# Patient Record
Sex: Female | Born: 1955 | Race: White | Hispanic: No | Marital: Single | Smoking: Current every day smoker
Health system: Southern US, Community
[De-identification: ages and names within clinical notes are randomized; demographics above are authoritative.]

## PROBLEM LIST (undated history)

## (undated) DIAGNOSIS — I639 Cerebral infarction, unspecified: Secondary | ICD-10-CM

## (undated) DIAGNOSIS — R0600 Dyspnea, unspecified: Secondary | ICD-10-CM

## (undated) DIAGNOSIS — G4730 Sleep apnea, unspecified: Secondary | ICD-10-CM

## (undated) DIAGNOSIS — K219 Gastro-esophageal reflux disease without esophagitis: Secondary | ICD-10-CM

## (undated) DIAGNOSIS — N189 Chronic kidney disease, unspecified: Secondary | ICD-10-CM

## (undated) DIAGNOSIS — Q249 Congenital malformation of heart, unspecified: Secondary | ICD-10-CM

## (undated) DIAGNOSIS — I10 Essential (primary) hypertension: Secondary | ICD-10-CM

## (undated) HISTORY — DX: Essential (primary) hypertension: I10

## (undated) HISTORY — DX: Congenital malformation of heart, unspecified: Q24.9

## (undated) HISTORY — PX: RENAL BIOPSY: SHX156

## (undated) HISTORY — DX: Sleep apnea, unspecified: G47.30

## (undated) HISTORY — DX: Cerebral infarction, unspecified: I63.9

## (undated) HISTORY — DX: Gastro-esophageal reflux disease without esophagitis: K21.9

## (undated) HISTORY — PX: TONSILLECTOMY: SUR1361

---

## 2010-05-21 LAB — FECAL OCCULT BLOOD, GUAIAC: Fecal Occult Blood: NEGATIVE

## 2010-05-29 ENCOUNTER — Encounter: Payer: Self-pay | Admitting: Nurse Practitioner

## 2010-05-29 DIAGNOSIS — K219 Gastro-esophageal reflux disease without esophagitis: Secondary | ICD-10-CM

## 2010-05-29 DIAGNOSIS — E785 Hyperlipidemia, unspecified: Secondary | ICD-10-CM | POA: Insufficient documentation

## 2010-05-29 DIAGNOSIS — I10 Essential (primary) hypertension: Secondary | ICD-10-CM | POA: Insufficient documentation

## 2010-05-29 DIAGNOSIS — M8580 Other specified disorders of bone density and structure, unspecified site: Secondary | ICD-10-CM | POA: Insufficient documentation

## 2011-02-11 ENCOUNTER — Ambulatory Visit: Payer: Worker's Compensation | Attending: Orthopedic Surgery | Admitting: Physical Therapy

## 2011-02-11 DIAGNOSIS — M25579 Pain in unspecified ankle and joints of unspecified foot: Secondary | ICD-10-CM | POA: Insufficient documentation

## 2011-02-11 DIAGNOSIS — R269 Unspecified abnormalities of gait and mobility: Secondary | ICD-10-CM | POA: Insufficient documentation

## 2011-02-11 DIAGNOSIS — M25676 Stiffness of unspecified foot, not elsewhere classified: Secondary | ICD-10-CM | POA: Insufficient documentation

## 2011-02-11 DIAGNOSIS — R5381 Other malaise: Secondary | ICD-10-CM | POA: Insufficient documentation

## 2011-02-11 DIAGNOSIS — M25673 Stiffness of unspecified ankle, not elsewhere classified: Secondary | ICD-10-CM | POA: Insufficient documentation

## 2011-02-11 DIAGNOSIS — IMO0001 Reserved for inherently not codable concepts without codable children: Secondary | ICD-10-CM | POA: Insufficient documentation

## 2011-02-13 ENCOUNTER — Ambulatory Visit: Payer: Worker's Compensation | Admitting: Physical Therapy

## 2011-02-20 ENCOUNTER — Ambulatory Visit: Payer: Worker's Compensation | Admitting: Physical Therapy

## 2011-02-26 ENCOUNTER — Ambulatory Visit: Payer: Worker's Compensation | Attending: Orthopedic Surgery | Admitting: Physical Therapy

## 2011-02-26 DIAGNOSIS — R269 Unspecified abnormalities of gait and mobility: Secondary | ICD-10-CM | POA: Insufficient documentation

## 2011-02-26 DIAGNOSIS — IMO0001 Reserved for inherently not codable concepts without codable children: Secondary | ICD-10-CM | POA: Insufficient documentation

## 2011-02-26 DIAGNOSIS — M25673 Stiffness of unspecified ankle, not elsewhere classified: Secondary | ICD-10-CM | POA: Insufficient documentation

## 2011-02-26 DIAGNOSIS — M25676 Stiffness of unspecified foot, not elsewhere classified: Secondary | ICD-10-CM | POA: Insufficient documentation

## 2011-02-26 DIAGNOSIS — M25579 Pain in unspecified ankle and joints of unspecified foot: Secondary | ICD-10-CM | POA: Insufficient documentation

## 2011-02-26 DIAGNOSIS — R5381 Other malaise: Secondary | ICD-10-CM | POA: Insufficient documentation

## 2011-03-05 ENCOUNTER — Encounter: Payer: Self-pay | Admitting: Physical Therapy

## 2012-05-24 ENCOUNTER — Other Ambulatory Visit: Payer: Self-pay

## 2012-05-24 MED ORDER — SIMVASTATIN 20 MG PO TABS: 20.0000 mg | ORAL_TABLET | Freq: Every day | ORAL | Status: DC

## 2012-06-09 ENCOUNTER — Other Ambulatory Visit: Payer: Self-pay | Admitting: *Deleted

## 2012-06-09 MED ORDER — LISINOPRIL 20 MG PO TABS: 20.0000 mg | ORAL_TABLET | Freq: Every day | ORAL | Status: DC

## 2012-06-09 NOTE — Telephone Encounter (Signed)
LAST OV 9/13

## 2012-08-02 ENCOUNTER — Encounter: Payer: Self-pay | Admitting: Nurse Practitioner

## 2012-08-02 ENCOUNTER — Ambulatory Visit (INDEPENDENT_AMBULATORY_CARE_PROVIDER_SITE_OTHER): Payer: PRIVATE HEALTH INSURANCE | Admitting: Nurse Practitioner

## 2012-08-02 VITALS — BP 131/76 | HR 56 | Temp 96.8°F | Ht 69.0 in | Wt 250.0 lb

## 2012-08-02 DIAGNOSIS — E785 Hyperlipidemia, unspecified: Secondary | ICD-10-CM

## 2012-08-02 DIAGNOSIS — I10 Essential (primary) hypertension: Secondary | ICD-10-CM

## 2012-08-02 DIAGNOSIS — M8580 Other specified disorders of bone density and structure, unspecified site: Secondary | ICD-10-CM

## 2012-08-02 DIAGNOSIS — K219 Gastro-esophageal reflux disease without esophagitis: Secondary | ICD-10-CM

## 2012-08-02 DIAGNOSIS — R5381 Other malaise: Secondary | ICD-10-CM

## 2012-08-02 DIAGNOSIS — M949 Disorder of cartilage, unspecified: Secondary | ICD-10-CM

## 2012-08-02 DIAGNOSIS — M899 Disorder of bone, unspecified: Secondary | ICD-10-CM

## 2012-08-02 DIAGNOSIS — R079 Chest pain, unspecified: Secondary | ICD-10-CM

## 2012-08-02 DIAGNOSIS — R5383 Other fatigue: Secondary | ICD-10-CM

## 2012-08-02 NOTE — Progress Notes (Signed)
Subjective:    Patient ID: Lori Ortiz, female    DOB: 03-02-1955, 58 y.o.   MRN: [id number]  Hypertension This is a chronic problem. The current episode started more than 1 year ago. The problem has been resolved since onset. The problem is controlled. Associated symptoms include chest pain. Pertinent negatives include no blurred vision, headaches, malaise/fatigue, orthopnea, palpitations, peripheral edema or shortness of breath. There are no associated agents to hypertension. Risk factors for coronary artery disease include dyslipidemia, obesity, post-menopausal state and sedentary lifestyle. Past treatments include ACE inhibitors and beta blockers. Compliance problems include diet and exercise.  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  Hyperlipidemia This is a chronic problem. The current episode started more than 1 year ago. The problem is controlled. Recent lipid tests were reviewed and are normal. Exacerbating diseases include obesity. Factors aggravating her hyperlipidemia include beta blockers. Associated symptoms include chest pain. Pertinent negatives include no shortness of breath. Current antihyperlipidemic treatment includes statins. The current treatment provides moderate improvement of lipids. Compliance problems include adherence to diet and adherence to exercise.  Risk factors for coronary artery disease include hypertension, obesity, post-menopausal and a sedentary lifestyle.  GERD Zantac daily- works well for her Fatigue Patient says she feels fatigued all the time - if she sits for any length of time she falls asleep. Chest pain Occurs mainly at work- Can last for several hours- Doesn't have any associated symptoms like SOB or sweating- Describes pain as uncomfortable rather than a crushing pain.  Review of Systems  Constitutional: Positive for fatigue. Negative for malaise/fatigue and unexpected weight change.  Eyes: Negative for blurred  vision.  Respiratory: Positive for chest tightness. Negative for cough, choking and shortness of breath.   Cardiovascular: Positive for chest pain. Negative for palpitations, orthopnea and leg swelling.  Gastrointestinal: Negative.   Endocrine: Negative.   Genitourinary: Negative.   Neurological: Negative for headaches.       Objective:   Physical Exam  Constitutional: She is oriented to person, place, and time. She appears well-developed and well-nourished.  HENT:  Nose: Nose normal.  Mouth/Throat: Oropharynx is clear and moist.  Eyes: EOM are normal.  Neck: Trachea normal, normal range of motion and full passive range of motion without pain. Neck supple. No JVD present. Carotid bruit is not present. No thyromegaly present.  Cardiovascular: Normal rate, regular rhythm, normal heart sounds and intact distal pulses.  Exam reveals no gallop and no friction rub.   No murmur heard. Pulmonary/Chest: Effort normal and breath sounds normal.  Abdominal: Soft. Bowel sounds are normal. She exhibits no distension and no mass. There is no tenderness.  Musculoskeletal: Normal range of motion.  Lymphadenopathy:    She has no cervical adenopathy.  Neurological: She is alert and oriented to person, place, and time. She has normal reflexes.  Skin: Skin is warm and dry.  Psychiatric: She has a normal mood and affect. Her behavior is normal. Judgment and thought content normal.   BP 131/76  Pulse 56  Temp(Src) 96.8 F (36 C) (Oral)  Ht 5\' 9"  (1.753 m)  Wt 250 lb (113.399 kg)  BMI 36.9 kg/m2  EKG-NSR      Assessment & Plan:   1. Chest pain   2. Hypertension   3. Hyperlipidemia   4. GERD (gastroesophageal reflux disease)   5. Osteopenia    Orders Placed This Encounter  Procedures  . COMPLETE METABOLIC PANEL WITH GFR  . COMPLETE METABOLIC PANEL WITH GFR  .  NMR Lipoprofile with Lipids  . Anemia panel 7  . Thyroid Panel With TSH  . EKG 12-Lead     Medication List       These  changes are accurate as of: 08/02/2012  4:22 PM. If you have any questions, ask your nurse or doctor.          STOP taking these medications       calcium carbonate 200 MG capsule  Stopped by:  Chevis Pretty, FNP     HEMOCYTE PLUS PO  Stopped by:  Chevis Pretty, FNP     [facility] 500-40 MG Tb24  Generic drug:  Niacin-Simvastatin  Stopped by:  Chevis Pretty, FNP     Vitamin D 2000 UNITS Caps  Stopped by:  Chevis Pretty, FNP      TAKE these medications       lisinopril 20 MG tablet  Commonly known as:  PRINIVIL,ZESTRIL  Take 1 tablet (20 mg total) by mouth daily.     metoprolol tartrate 25 MG tablet  Commonly known as:  LOPRESSOR  Take 25 mg by mouth 2 (two) times daily.     ranitidine 150 MG tablet  Commonly known as:  ZANTAC  Take 150 mg by mouth 2 (two) times daily.     simvastatin 20 MG tablet  Commonly known as:  ZOCOR  Take 1 tablet (20 mg total) by mouth at bedtime.       Continue all meds Labs pending Diet an dexercsie encouraged Stress management  Mary-Margaret Hassell Done, FNP

## 2012-08-02 NOTE — Patient Instructions (Signed)
Stress Stress-related medical problems are becoming increasingly common. The body has a built-in physical response to stressful situations. Faced with pressure, challenge or danger, we need to react quickly. Our bodies release hormones such as cortisol and adrenaline to help do this. These hormones are part of the "fight or flight" response and affect the metabolic rate, heart rate and blood pressure, resulting in a heightened, stressed state that prepares the body for optimum performance in dealing with a stressful situation. It is likely that early man required these mechanisms to stay alive, but usually modern stresses do not call for this, and the same hormones released in today's world can damage health and reduce coping ability. CAUSES  Pressure to perform at work, at school or in sports.  Threats of physical violence.  Money worries.  Arguments.  Family conflicts.  Divorce or separation from significant other.  Bereavement.  New job or unemployment.  Changes in location.  Alcohol or drug abuse. SOMETIMES, THERE IS NO PARTICULAR REASON FOR DEVELOPING STRESS. Almost all people are at risk of being stressed at some time in their lives. It is important to know that some stress is temporary and some is long term.  Temporary stress will go away when a situation is resolved. Most people can cope with short periods of stress, and it can often be relieved by relaxing, taking a walk, chatting through issues with friends, or having a good night's sleep.  Chronic (long-term, continuous) stress is much harder to deal with. It can be psychologically and emotionally damaging. It can be harmful both for an individual and for friends and family. SYMPTOMS Everyone reacts to stress differently. There are some common effects that help [place] recognize it. In times of extreme stress, people may:  Shake uncontrollably.  Breathe faster and deeper than normal (hyperventilate).  Vomit.  For people  with asthma, stress can trigger an attack.  For some people, stress may trigger migraine headaches, ulcers, and body pain. PHYSICAL EFFECTS OF STRESS MAY INCLUDE:  Loss of energy.  Skin problems.  Aches and pains resulting from tense muscles, including neck ache, backache and tension headaches.  Increased pain from arthritis and other conditions.  Irregular heart beat (palpitations).  Periods of irritability or anger.  Apathy or depression.  Anxiety (feeling uptight or worrying).  Unusual behavior.  Loss of appetite.  Comfort eating.  Lack of concentration.  Loss of, or decreased, sex-drive.  Increased smoking, drinking, or recreational drug use.  For women, missed periods.  Ulcers, joint pain, and muscle pain. Post-traumatic stress is the stress caused by any serious accident, strong emotional damage, or extremely difficult or violent experience such as rape or war. Post-traumatic stress victims can experience mixtures of emotions such as fear, shame, depression, guilt or anger. It may include recurrent memories or images that may be haunting. These feelings can last for weeks, months or even years after the traumatic event that triggered them. Specialized treatment, possibly with medicines and psychological therapies, is available. If stress is causing physical symptoms, severe distress or making it difficult for you to function as normal, it is worth seeing your caregiver. It is important to remember that although stress is a usual part of life, extreme or prolonged stress can lead to other illnesses that will need treatment. It is better to visit a doctor sooner rather than later. Stress has been linked to the development of high blood pressure and heart disease, as well as insomnia and depression. There is no diagnostic test for  stress since everyone reacts to it differently. But a caregiver will be able to spot the physical symptoms, such  as:  Headaches.  Shingles.  Ulcers. Emotional distress such as intense worry, low mood or irritability should be detected when the doctor asks pertinent questions to identify any underlying problems that might be the cause. In case there are physical reasons for the symptoms, the doctor may also want to do some tests to exclude certain conditions. If you feel that you are suffering from stress, try to identify the aspects of your life that are causing it. Sometimes you may not be able to change or avoid them, but even a small change can have a positive ripple effect. A simple lifestyle change can make all the difference. STRATEGIES THAT CAN HELP DEAL WITH STRESS:  Delegating or sharing responsibilities.  Avoiding confrontations.  Learning to be more assertive.  Regular exercise.  Avoid using alcohol or street drugs to cope.  Eating a healthy, balanced diet, rich in fruit and vegetables and proteins.  Finding humor or absurdity in stressful situations.  Never taking on more than you know you can handle comfortably.  Organizing your time better to get as much done as possible.  Talking to friends or family and sharing your thoughts and fears.  Listening to music or relaxation tapes.  Tensing and then relaxing your muscles, starting at the toes and working up to the head and neck. If you think that you would benefit from help, either in identifying the things that are causing your stress or in learning techniques to help you relax, see a caregiver who is capable of helping you with this. Rather than relying on medications, it is usually better to try and identify the things in your life that are causing stress and try to deal with them. There are many techniques of managing stress including counseling, psychotherapy, aromatherapy, yoga, and exercise. Your caregiver can help you determine what is best for you. Document Released: 05/03/2002 Document Revised: 05/05/2011 Document  Reviewed: 03/30/2007 [facility] Patient Information 2014 [place], [place].

## 2012-08-03 LAB — NMR LIPOPROFILE WITH LIPIDS
HDL Size: 8.9 nm — ABNORMAL LOW (ref >=9.2)
HDL-C: 33 mg/dL — ABNORMAL LOW (ref >=40)
LDL (calc): 67 mg/dL (ref <100)
LDL Particle Number: 1373 nmol/L — ABNORMAL HIGH (ref <1000)
LDL Size: 19.6 nm — ABNORMAL LOW (ref >20.5)
Large HDL-P: 4.5 umol/L — ABNORMAL LOW (ref >=4.8)
VLDL Size: 52.3 nm — ABNORMAL HIGH (ref <=46.6)

## 2012-08-03 LAB — THYROID PANEL WITH TSH
T4, Total: 10.2 ug/dL (ref 5.0–12.5)
TSH: 1.253 u[IU]/mL (ref 0.350–4.500)

## 2012-08-03 LAB — ANEMIA PANEL 7
%SAT: 30 % (ref 20–55)
ABS Retic: 34.8 10*3/uL (ref 19.0–186.0)
Ferritin: 166 ng/mL (ref 10–291)
MCV: 89.7 fL (ref 78.0–100.0)
Platelets: 269 10*3/uL (ref 150–400)
RDW: 13.2 % (ref 11.5–15.5)
Retic Ct Pct: 0.8 % (ref 0.4–2.3)
Vitamin B-12: 310 pg/mL (ref 211–911)
WBC: 6.8 10*3/uL (ref 4.0–10.5)

## 2012-08-03 LAB — COMPLETE METABOLIC PANEL WITH GFR
Alkaline Phosphatase: 90 U/L (ref 39–117)
BUN: 28 mg/dL — ABNORMAL HIGH (ref 6–23)
CO2: 28 mEq/L (ref 19–32)
Creat: 1.21 mg/dL — ABNORMAL HIGH (ref 0.50–1.10)
GFR, Est African American: 57 mL/min — ABNORMAL LOW
GFR, Est Non African American: 49 mL/min — ABNORMAL LOW
Glucose, Bld: 99 mg/dL (ref 70–99)
Total Bilirubin: 0.3 mg/dL (ref 0.3–1.2)

## 2012-08-04 NOTE — Progress Notes (Signed)
Pt aware of labs will change to zocor 40- please send in [facility]- pt aware that we will

## 2012-08-13 ENCOUNTER — Other Ambulatory Visit: Payer: Self-pay | Admitting: General Practice

## 2012-12-07 ENCOUNTER — Other Ambulatory Visit: Payer: Self-pay | Admitting: Nurse Practitioner

## 2012-12-08 ENCOUNTER — Encounter: Payer: Self-pay | Admitting: Family Medicine

## 2012-12-08 ENCOUNTER — Encounter (INDEPENDENT_AMBULATORY_CARE_PROVIDER_SITE_OTHER): Payer: Self-pay

## 2012-12-08 ENCOUNTER — Ambulatory Visit (INDEPENDENT_AMBULATORY_CARE_PROVIDER_SITE_OTHER): Payer: PRIVATE HEALTH INSURANCE | Admitting: Family Medicine

## 2012-12-08 VITALS — BP 165/75 | HR 65 | Temp 97.8°F | Ht 69.0 in | Wt 247.6 lb

## 2012-12-08 DIAGNOSIS — J069 Acute upper respiratory infection, unspecified: Secondary | ICD-10-CM

## 2012-12-08 MED ORDER — AMOXICILLIN 875 MG PO TABS: 875.0000 mg | ORAL_TABLET | Freq: Two times a day (BID) | ORAL | Status: DC

## 2012-12-08 MED ORDER — BENZONATATE 200 MG PO CAPS: 200.0000 mg | ORAL_CAPSULE | Freq: Two times a day (BID) | ORAL | Status: DC | PRN

## 2012-12-08 NOTE — Patient Instructions (Signed)

## 2012-12-08 NOTE — Telephone Encounter (Signed)
This request says 20 mg, but last labs in 06/14, says to increase to 40mg , but i dont think pt was ever notified?

## 2012-12-08 NOTE — Telephone Encounter (Signed)
Let patien know need to increase zocor dose to 40 mg due to lab work- rx sent to pharmacy

## 2012-12-08 NOTE — Progress Notes (Signed)
  Subjective:    Patient ID: Lori Ortiz, female    DOB: May 04, 1955, 57 y.o.   MRN: [id number]  HPI This 57 y.o. female presents for evaluation of URI sx's for 2 weeks.  She has Been coughing and she has been taking mucinex otc and it is not helping.   Review of Systems C/o cough and uri sx's.   No chest pain, SOB, HA, dizziness, vision change, N/V, diarrhea, constipation, dysuria, urinary urgency or frequency, myalgias, arthralgias or rash.  Objective:   Physical Exam  Vital signs noted  Well developed well nourished female.  HEENT - Head atraumatic Normocephalic                Eyes - PERRLA, Conjuctiva - clear Sclera- Clear EOMI                Ears - EAC's Wnl TM's Wnl Gross Hearing WNL                Nose - Nares patent                 Throat - oropharanx wnl Respiratory - Lungs CTA bilateral Cardiac - RRR S1 and S2 without murmur GI - Abdomen soft Nontender and bowel sounds active x 4 Extremities - No edema. Neuro - Grossly intact.      Assessment & Plan:  URI (upper respiratory infection) - Plan: amoxicillin (AMOXIL) 875 MG tablet, benzonatate (TESSALON) 200 MG capsule Push po fluids, rest, and follow up prn.  Lysbeth Penner FNP

## 2012-12-17 ENCOUNTER — Telehealth: Payer: Self-pay | Admitting: Family Medicine

## 2012-12-27 ENCOUNTER — Other Ambulatory Visit: Payer: Self-pay | Admitting: Family Medicine

## 2012-12-27 NOTE — Telephone Encounter (Signed)
I CALLED PT BUT NA. CAN YOU REVIEW HER MESSAGE . THANKS

## 2012-12-27 NOTE — Telephone Encounter (Signed)
Please follow up if not better.

## 2012-12-29 NOTE — Telephone Encounter (Signed)
Patient notified that NTBS for follow up. Will call back for appt when she gets her work schedule

## 2013-03-23 ENCOUNTER — Other Ambulatory Visit: Payer: Self-pay | Admitting: Nurse Practitioner

## 2013-04-01 ENCOUNTER — Other Ambulatory Visit: Payer: Self-pay | Admitting: Nurse Practitioner

## 2013-04-23 ENCOUNTER — Other Ambulatory Visit: Payer: Self-pay | Admitting: Nurse Practitioner

## 2013-04-26 NOTE — Telephone Encounter (Signed)
Last seen 10/15/ [facility]  Last lipid 08/02/12

## 2013-04-29 ENCOUNTER — Other Ambulatory Visit: Payer: Self-pay | Admitting: Nurse Practitioner

## 2013-05-02 NOTE — Telephone Encounter (Signed)
Last seen 12/08/12  JWO  Requesting a 90 day supply

## 2013-05-26 ENCOUNTER — Other Ambulatory Visit: Payer: Self-pay | Admitting: Family Medicine

## 2013-05-30 ENCOUNTER — Telehealth: Payer: Self-pay | Admitting: Family Medicine

## 2013-05-30 NOTE — Telephone Encounter (Signed)
DONE

## 2013-05-31 ENCOUNTER — Encounter: Payer: Self-pay | Admitting: Family Medicine

## 2013-05-31 ENCOUNTER — Ambulatory Visit (INDEPENDENT_AMBULATORY_CARE_PROVIDER_SITE_OTHER): Payer: PRIVATE HEALTH INSURANCE | Admitting: Family Medicine

## 2013-05-31 VITALS — BP 128/80 | HR 64 | Temp 97.6°F | Ht 69.0 in | Wt 258.2 lb

## 2013-05-31 DIAGNOSIS — J209 Acute bronchitis, unspecified: Secondary | ICD-10-CM

## 2013-05-31 DIAGNOSIS — I10 Essential (primary) hypertension: Secondary | ICD-10-CM

## 2013-05-31 DIAGNOSIS — E785 Hyperlipidemia, unspecified: Secondary | ICD-10-CM

## 2013-05-31 DIAGNOSIS — Z0000 Encounter for general adult medical examination without abnormal findings: Secondary | ICD-10-CM

## 2013-05-31 LAB — POCT CBC
Granulocyte percent: 74.1 %G (ref 37–80)
HCT, POC: 42.1 % (ref 37.7–47.9)
Hemoglobin: 13.1 g/dL (ref 12.2–16.2)
Lymph, poc: 1.7 (ref 0.6–3.4)
MCH, POC: 28.4 pg (ref 27–31.2)
MCHC: 31 g/dL — AB (ref 31.8–35.4)
MCV: 91.4 fL (ref 80–97)
MPV: 7.8 fL (ref 0–99.8)
POC Granulocyte: 5.6 (ref 2–6.9)
POC LYMPH PERCENT: 23.2 %L (ref 10–50)
Platelet Count, POC: 306 10*3/uL (ref 142–424)
RBC: 4.6 M/uL (ref 4.04–5.48)
RDW, POC: 12.8 %
WBC: 7.5 10*3/uL (ref 4.6–10.2)

## 2013-05-31 MED ORDER — LISINOPRIL 20 MG PO TABS: 20.0000 mg | ORAL_TABLET | Freq: Every day | ORAL | Status: DC

## 2013-05-31 MED ORDER — HYDROCODONE-HOMATROPINE 5-1.5 MG/5ML PO SYRP: 5.0000 mL | ORAL_SOLUTION | Freq: Three times a day (TID) | ORAL | Status: DC | PRN

## 2013-05-31 MED ORDER — METOPROLOL TARTRATE 25 MG PO TABS: 25.0000 mg | ORAL_TABLET | Freq: Two times a day (BID) | ORAL | Status: DC

## 2013-05-31 MED ORDER — METHYLPREDNISOLONE (PAK) 4 MG PO TABS: ORAL_TABLET | ORAL | Status: DC

## 2013-05-31 MED ORDER — SIMVASTATIN 40 MG PO TABS: 40.0000 mg | ORAL_TABLET | Freq: Every day | ORAL | Status: DC

## 2013-05-31 MED ORDER — AZITHROMYCIN 250 MG PO TABS: ORAL_TABLET | ORAL | Status: DC

## 2013-05-31 NOTE — Progress Notes (Signed)
   Subjective:    Patient ID: Lori Ortiz, female    DOB: [birth year], 58 y.o.   MRN: [id number]  HPI This 58 y.o. female presents for evaluation of uri sx's, cough, and bronchitis sx's.  She has hx Of hypertension, hyperlipidemia, and GERD.  She is requesting labs and med refills.  She has Not been in the office for awhile.   Review of Systems C/o cough and uri sx's   No chest pain, SOB, HA, dizziness, vision change, N/V, diarrhea, constipation, dysuria, urinary urgency or frequency, myalgias, arthralgias or rash.  Objective:   Physical Exam  Vital signs noted  Well developed well nourished female.  HEENT - Head atraumatic Normocephalic                Eyes - PERRLA, Conjuctiva - clear Sclera- Clear EOMI                Ears - EAC's Wnl TM's Wnl Gross Hearing WNL                Nose - Nares patent                 Throat - oropharanx wnl Respiratory - Lungs with expiratory wheezes on the right  Cardiac - RRR S1 and S2 without murmur GI - Abdomen soft Nontender and bowel sounds active x 4 Extremities - No edema. Neuro - Grossly intact.      Assessment & Plan:  Routine general medical examination at a health care facility - Plan: POCT CBC, CMP14+EGFR, Lipid panel, Thyroid Panel With TSH, Vit D  25 hydroxy (rtn osteoporosis monitoring)  Other and unspecified hyperlipidemia - Plan: simvastatin (ZOCOR) 40 MG tablet, Lipid panel  Unspecified essential hypertension - Plan: metoprolol tartrate (LOPRESSOR) 25 MG tablet  Acute bronchitis - Plan: azithromycin (ZITHROMAX) 250 MG tablet, HYDROcodone-homatropine (HYCODAN) 5-1.5 MG/5ML syrup, methylPREDNIsolone (MEDROL DOSPACK) 4 MG tablet Push po fluids, rest, tylenol and motrin otc prn as directed for fever, arthralgias, and myalgias.  Follow up prn if sx's continue or persist.  Lysbeth Penner FNP

## 2013-06-01 LAB — LIPID PANEL
Chol/HDL Ratio: 4.7 ratio units — ABNORMAL HIGH (ref 0.0–4.4)
Cholesterol, Total: 150 mg/dL (ref 100–199)
HDL: 32 mg/dL — ABNORMAL LOW (ref >39)
LDL Calculated: 90 mg/dL (ref 0–99)
Triglycerides: 138 mg/dL (ref 0–149)
VLDL Cholesterol Cal: 28 mg/dL (ref 5–40)

## 2013-06-01 LAB — CMP14+EGFR
ALT: 11 IU/L (ref 0–32)
AST: 15 IU/L (ref 0–40)
Albumin/Globulin Ratio: 1.8 (ref 1.1–2.5)
Albumin: 4 g/dL (ref 3.5–5.5)
Alkaline Phosphatase: 101 IU/L (ref 39–117)
BUN/Creatinine Ratio: 16 (ref 9–23)
BUN: 18 mg/dL (ref 6–24)
CO2: 24 mmol/L (ref 18–29)
Calcium: 9.8 mg/dL (ref 8.7–10.2)
Chloride: 104 mmol/L (ref 97–108)
Creatinine, Ser: 1.12 mg/dL — ABNORMAL HIGH (ref 0.57–1.00)
GFR calc Af Amer: 63 mL/min/{1.73_m2} (ref >59)
GFR calc non Af Amer: 54 mL/min/{1.73_m2} — ABNORMAL LOW (ref >59)
Globulin, Total: 2.2 g/dL (ref 1.5–4.5)
Glucose: 108 mg/dL — ABNORMAL HIGH (ref 65–99)
Potassium: 4.4 mmol/L (ref 3.5–5.2)
Sodium: 144 mmol/L (ref 134–144)
Total Bilirubin: 0.3 mg/dL (ref 0.0–1.2)
Total Protein: 6.2 g/dL (ref 6.0–8.5)

## 2013-06-01 LAB — THYROID PANEL WITH TSH
Free Thyroxine Index: 2 (ref 1.2–4.9)
T3 Uptake Ratio: 28 % (ref 24–39)
T4, Total: 7.1 ug/dL (ref 4.5–12.0)
TSH: 1.8 u[IU]/mL (ref 0.450–4.500)

## 2013-06-01 LAB — VITAMIN D 25 HYDROXY (VIT D DEFICIENCY, FRACTURES): Vit D, 25-Hydroxy: 27.1 ng/mL — ABNORMAL LOW (ref 30.0–100.0)

## 2013-12-02 ENCOUNTER — Encounter: Payer: Self-pay | Admitting: Family Medicine

## 2013-12-02 ENCOUNTER — Ambulatory Visit (INDEPENDENT_AMBULATORY_CARE_PROVIDER_SITE_OTHER): Payer: PRIVATE HEALTH INSURANCE

## 2013-12-02 ENCOUNTER — Ambulatory Visit (INDEPENDENT_AMBULATORY_CARE_PROVIDER_SITE_OTHER): Payer: PRIVATE HEALTH INSURANCE | Admitting: Family Medicine

## 2013-12-02 ENCOUNTER — Encounter: Payer: Self-pay | Admitting: *Deleted

## 2013-12-02 VITALS — BP 152/80 | HR 59 | Temp 98.2°F | Ht 69.0 in | Wt 238.0 lb

## 2013-12-02 DIAGNOSIS — J209 Acute bronchitis, unspecified: Secondary | ICD-10-CM

## 2013-12-02 DIAGNOSIS — Z720 Tobacco use: Secondary | ICD-10-CM

## 2013-12-02 DIAGNOSIS — R059 Cough, unspecified: Secondary | ICD-10-CM

## 2013-12-02 DIAGNOSIS — R05 Cough: Secondary | ICD-10-CM

## 2013-12-02 DIAGNOSIS — R062 Wheezing: Secondary | ICD-10-CM

## 2013-12-02 DIAGNOSIS — F1910 Other psychoactive substance abuse, uncomplicated: Secondary | ICD-10-CM

## 2013-12-02 LAB — POCT CBC
GRANULOCYTE PERCENT: 65.9 % (ref 37–80)
HEMATOCRIT: 38.9 % (ref 37.7–47.9)
Hemoglobin: 12.5 g/dL (ref 12.2–16.2)
Lymph, poc: 2 (ref 0.6–3.4)
MCH: 29.1 pg (ref 27–31.2)
MCHC: 32.2 g/dL (ref 31.8–35.4)
MCV: 90.1 fL (ref 80–97)
MPV: 7.5 fL (ref 0–99.8)
POC Granulocyte: 4.5 (ref 2–6.9)
POC LYMPH PERCENT: 28.5 %L (ref 10–50)
Platelet Count, POC: 295 10*3/uL (ref 142–424)
RBC: 4.3 M/uL (ref 4.04–5.48)
RDW, POC: 13.1 %
WBC: 6.9 10*3/uL (ref 4.6–10.2)

## 2013-12-02 MED ORDER — BUDESONIDE-FORMOTEROL FUMARATE 160-4.5 MCG/ACT IN AERO: 2.0000 | INHALATION_SPRAY | Freq: Two times a day (BID) | RESPIRATORY_TRACT | Status: DC

## 2013-12-02 MED ORDER — PREDNISONE 10 MG PO TABS: ORAL_TABLET | ORAL | Status: DC

## 2013-12-02 MED ORDER — ALBUTEROL SULFATE HFA 108 (90 BASE) MCG/ACT IN AERS: 2.0000 | INHALATION_SPRAY | Freq: Four times a day (QID) | RESPIRATORY_TRACT | Status: DC | PRN

## 2013-12-02 MED ORDER — LEVOFLOXACIN 500 MG PO TABS: 500.0000 mg | ORAL_TABLET | Freq: Every day | ORAL | Status: DC

## 2013-12-02 MED ORDER — METHYLPREDNISOLONE ACETATE 80 MG/ML IJ SUSP
60.0000 mg | Freq: Once | INTRAMUSCULAR | Status: AC
  Administered 2013-12-02: 60 mg via INTRAMUSCULAR

## 2013-12-02 NOTE — Patient Instructions (Signed)
Stop smoking now You can purchase Mucinex maximum strength, blue and white in color, over-the-counter, and take one twice daily with a large glass of water to help loosen cough and congestion Use an inhaler(s) as directed Take antibiotic as directed Consider getting a CT scan in the future because of your history of smoking

## 2013-12-02 NOTE — Progress Notes (Signed)
Subjective:    Patient ID: Lori Ortiz, female    DOB: May 08, 1955, 58 y.o.   MRN: [id number]  HPI Patient here today for cough and congestion. This started 1 week ago. She went to the [facility] urgent care on Sunday. They gave her a antibiotic shot, a zpak, and cough syrup. She states today she feels the same as she did on Sunday. The patient is a smoker for 25-30 years. She has had to use inhalers in the past. She has not had a chest x-ray and a good while.       Patient Active Problem List   Diagnosis Date Noted  . Hypertension 05/29/2010  . Hyperlipidemia 05/29/2010  . GERD (gastroesophageal reflux disease) 05/29/2010  . Osteopenia 05/29/2010   Outpatient Encounter Prescriptions as of 12/02/2013  Medication Sig  . lisinopril (PRINIVIL,ZESTRIL) 20 MG tablet Take 1 tablet (20 mg total) by mouth daily.  . metoprolol tartrate (LOPRESSOR) 25 MG tablet Take 1 tablet (25 mg total) by mouth 2 (two) times daily.  . ranitidine (ZANTAC) 150 MG tablet Take 150 mg by mouth 2 (two) times daily.    . simvastatin (ZOCOR) 40 MG tablet Take 1 tablet (40 mg total) by mouth daily at 6 PM.  . [DISCONTINUED] HYDROcodone-homatropine (HYCODAN) 5-1.5 MG/5ML syrup Take 5 mLs by mouth every 8 (eight) hours as needed for cough.  . [DISCONTINUED] methylPREDNIsolone (MEDROL DOSPACK) 4 MG tablet follow package directions  . [DISCONTINUED] azithromycin (ZITHROMAX) 250 MG tablet Take 2 po first day and then one po qd x 4 days    Review of Systems  Constitutional: Negative.  Negative for fever.  HENT: Positive for congestion (thick yellow tinted drainage) and postnasal drip.   Eyes: Negative.   Respiratory: Positive for cough.   Cardiovascular: Negative.   Gastrointestinal: Negative.   Endocrine: Negative.   Genitourinary: Negative.   Musculoskeletal: Negative.   Skin: Negative.   Allergic/Immunologic: Negative.   Neurological: Negative.   Hematological: Negative.   Psychiatric/Behavioral: Negative.         Objective:   Physical Exam  Nursing note and vitals reviewed. Constitutional: She is oriented to person, place, and time. She appears well-developed and well-nourished. No distress.  HENT:  Head: Normocephalic and atraumatic.  Right Ear: External ear normal.  Left Ear: External ear normal.  Mouth/Throat: Oropharynx is clear and moist.  Nasal congestion bilaterally  Eyes: Conjunctivae and EOM are normal. Pupils are equal, round, and reactive to light. Right eye exhibits no discharge. Left eye exhibits no discharge. No scleral icterus.  Neck: Normal range of motion. Neck supple. No thyromegaly present.  Cardiovascular: Normal rate, regular rhythm and normal heart sounds.   No murmur heard. Pulmonary/Chest: Effort normal. No respiratory distress. She has wheezes. She has no rales. She exhibits no tenderness.  Sparse wheezes and rhonchi bilaterally with a tight congested cough  Abdominal: She exhibits no mass.  Musculoskeletal: Normal range of motion. She exhibits no edema.  Lymphadenopathy:    She has cervical adenopathy (anterior cervical node on the left).  Neurological: She is alert and oriented to person, place, and time.  Skin: Skin is warm and dry. No rash noted.  Psychiatric: She has a normal mood and affect. Her behavior is normal. Judgment and thought content normal.   BP 152/80  Pulse 59  Temp(Src) 98.2 F (36.8 C) (Oral)  Ht 5\' 9"  (1.753 m)  Wt 238 lb (107.956 kg)  BMI 35.13 kg/m2   [facility] reading (PRIMARY) by  Dr. Brunilda Payor x-ray-question  early pneumonia                                   The CBC done today had a white blood cell count that was 6.9 with a hemoglobin that was 12.5. The platelet count was within normal limits. The patient was informed of this result before she left the office.       Assessment & Plan:  1. Cough - POCT CBC - DG Chest 2 View; Future - albuterol (PROVENTIL HFA;VENTOLIN HFA) 108 (90 BASE) MCG/ACT inhaler; Inhale 2 puffs into  the lungs every 6 (six) hours as needed for wheezing or shortness of breath.  Dispense: 1 Inhaler; Refill: 2 - predniSONE (DELTASONE) 10 MG tablet;  1 tablet 4 times a day for 2 days,  1 tablet 3 times a day for 2 days,  1 tablet 2 times a day for 2 days, 1 tablet daily for 2 days  Dispense: 20 tablet; Refill: 0  2. Acute bronchitis, unspecified organism - levofloxacin (LEVAQUIN) 500 MG tablet; Take 1 tablet (500 mg total) by mouth daily.  Dispense: 7 tablet; Refill: 0  3. Wheezing - budesonide-formoterol (SYMBICORT) 160-4.5 MCG/ACT inhaler; Inhale 2 puffs into the lungs 2 (two) times daily.  Dispense: 1 Inhaler; Refill: 3 - albuterol (PROVENTIL HFA;VENTOLIN HFA) 108 (90 BASE) MCG/ACT inhaler; Inhale 2 puffs into the lungs every 6 (six) hours as needed for wheezing or shortness of breath.  Dispense: 1 Inhaler; Refill: 2 - predniSONE (DELTASONE) 10 MG tablet; 1 tablet 4 times a day for 2 days,  1 tablet 3 times a day for 2 days,  1 tablet 2 times a day for 2 days, 1 tablet daily for 2 days  Dispense: 20 tablet; Refill: 0  4. Bronchospasm with bronchitis, acute - levofloxacin (LEVAQUIN) 500 MG tablet; Take 1 tablet (500 mg total) by mouth daily.  Dispense: 7 tablet; Refill: 0 - budesonide-formoterol (SYMBICORT) 160-4.5 MCG/ACT inhaler; Inhale 2 puffs into the lungs 2 (two) times daily.  Dispense: 1 Inhaler; Refill: 3 - albuterol (PROVENTIL HFA;VENTOLIN HFA) 108 (90 BASE) MCG/ACT inhaler; Inhale 2 puffs into the lungs every 6 (six) hours as needed for wheezing or shortness of breath.  Dispense: 1 Inhaler; Refill: 2 - predniSONE (DELTASONE) 10 MG tablet; 1 tablet 4 times a day for 2 days,  1 tablet 3 times a day for 2 days,  1 tablet 2 times a day for 2 days, 1 tablet daily for 2 days  Dispense: 20 tablet; Refill: 0 - methylPREDNISolone acetate (DEPO-MEDROL) injection 60 mg; Inject 0.75 mLs (60 mg total) into the muscle once.  5. Nicotine abuse  Patient Instructions  Stop smoking now You  can purchase Mucinex maximum strength, blue and white in color, over-the-counter, and take one twice daily with a large glass of water to help loosen cough and congestion Use an inhaler(s) as directed Take antibiotic as directed Consider getting a CT scan in the future because of your history of smoking   Arrie Senate MD

## 2013-12-19 ENCOUNTER — Ambulatory Visit (INDEPENDENT_AMBULATORY_CARE_PROVIDER_SITE_OTHER): Payer: PRIVATE HEALTH INSURANCE | Admitting: Family Medicine

## 2013-12-19 ENCOUNTER — Encounter: Payer: Self-pay | Admitting: Family Medicine

## 2013-12-19 VITALS — BP 128/73 | HR 74 | Temp 97.7°F | Ht 69.0 in | Wt 249.4 lb

## 2013-12-19 DIAGNOSIS — J9801 Acute bronchospasm: Secondary | ICD-10-CM

## 2013-12-19 DIAGNOSIS — R059 Cough, unspecified: Secondary | ICD-10-CM

## 2013-12-19 DIAGNOSIS — R05 Cough: Secondary | ICD-10-CM

## 2013-12-19 DIAGNOSIS — Z23 Encounter for immunization: Secondary | ICD-10-CM

## 2013-12-19 DIAGNOSIS — R062 Wheezing: Secondary | ICD-10-CM

## 2013-12-19 DIAGNOSIS — J209 Acute bronchitis, unspecified: Secondary | ICD-10-CM

## 2013-12-19 MED ORDER — BUDESONIDE-FORMOTEROL FUMARATE 160-4.5 MCG/ACT IN AERO: 2.0000 | INHALATION_SPRAY | Freq: Two times a day (BID) | RESPIRATORY_TRACT | Status: DC

## 2013-12-19 MED ORDER — BUDESONIDE-FORMOTEROL FUMARATE 80-4.5 MCG/ACT IN AERO: 2.0000 | INHALATION_SPRAY | Freq: Two times a day (BID) | RESPIRATORY_TRACT | Status: DC

## 2013-12-19 NOTE — Progress Notes (Signed)
   Subjective:    Patient ID: Lori Ortiz, female    DOB: May 08, 1955, 58 y.o.   MRN: [id number]  HPIPt here for 2 week follow up on cough. The patient is doing better. She still has some cough and some congestion but it is improved.    Review of Systems  Constitutional: Negative.   HENT: Positive for congestion.   Eyes: Negative.   Respiratory: Positive for cough.   Cardiovascular: Negative.   Gastrointestinal: Negative.   Endocrine: Negative.   Genitourinary: Negative.   Musculoskeletal: Negative.   Skin: Negative.   Allergic/Immunologic: Negative.   Neurological: Negative.   Hematological: Negative.   Psychiatric/Behavioral: Negative.          Objective:   Physical Exam  Nursing note and vitals reviewed. Constitutional: She is oriented to person, place, and time. She appears well-developed and well-nourished. No distress.  HENT:  Head: Normocephalic and atraumatic.  Right Ear: External ear normal.  Left Ear: External ear normal.  Nose: Nose normal.  Mouth/Throat: Oropharynx is clear and moist. No oropharyngeal exudate.  Eyes: Conjunctivae and EOM are normal. Pupils are equal, round, and reactive to light. Right eye exhibits no discharge. Left eye exhibits no discharge. No scleral icterus.  Neck: Normal range of motion. Neck supple. No thyromegaly present.  No anterior cervical adenopathy  Cardiovascular: Normal rate, regular rhythm and normal heart sounds.   No murmur heard. The heart rhythm is regular at 72/m  Pulmonary/Chest: Effort normal. No respiratory distress. She has no wheezes. She has no rales. She exhibits no tenderness.  Minimal congestion with coughing and rare wheezing  Abdominal: Bowel sounds are normal.  Musculoskeletal: Normal range of motion. She exhibits no edema.  Lymphadenopathy:    She has no cervical adenopathy.  Neurological: She is alert and oriented to person, place, and time.  Skin: Skin is warm and dry.  Psychiatric: She has a normal  mood and affect. Her behavior is normal. Judgment and thought content normal.   BP 128/73  Pulse 74  Temp(Src) 97.7 F (36.5 C) (Oral)  Ht 5\' 9"  (1.753 m)  Wt 249 lb 6 oz (113.116 kg)  BMI 36.81 kg/m2        Assessment & Plan:  1. Cough  2. Bronchospasm   Patient Instructions  Continue with Symbicort 2 puffs twice daily and rinse mouth after using Use the pro-air or albuterol inhaler as a rescue inhaler Drink plenty of fluids Keep the home cooler Continue to use Mucinex maximum strength 1 tablet twice daily with a large glass of water Remember to avoid irritating environments Remember to wash her hands are diffuse hand cleanser after being in the public   Arrie Senate MD

## 2013-12-19 NOTE — Patient Instructions (Signed)
Continue with Symbicort 2 puffs twice daily and rinse mouth after using Use the pro-air or albuterol inhaler as a rescue inhaler Drink plenty of fluids Keep the home cooler Continue to use Mucinex maximum strength 1 tablet twice daily with a large glass of water Remember to avoid irritating environments Remember to wash her hands are diffuse hand cleanser after being in the public

## 2014-06-11 ENCOUNTER — Other Ambulatory Visit: Payer: Self-pay | Admitting: Family Medicine

## 2014-06-20 ENCOUNTER — Ambulatory Visit (INDEPENDENT_AMBULATORY_CARE_PROVIDER_SITE_OTHER): Payer: PRIVATE HEALTH INSURANCE

## 2014-06-20 ENCOUNTER — Encounter: Payer: Self-pay | Admitting: Family Medicine

## 2014-06-20 ENCOUNTER — Ambulatory Visit (INDEPENDENT_AMBULATORY_CARE_PROVIDER_SITE_OTHER): Payer: PRIVATE HEALTH INSURANCE | Admitting: Family Medicine

## 2014-06-20 VITALS — BP 157/70 | HR 58 | Temp 97.8°F | Ht 69.0 in | Wt 255.0 lb

## 2014-06-20 DIAGNOSIS — M25562 Pain in left knee: Secondary | ICD-10-CM | POA: Diagnosis not present

## 2014-06-20 DIAGNOSIS — I10 Essential (primary) hypertension: Secondary | ICD-10-CM

## 2014-06-20 DIAGNOSIS — E785 Hyperlipidemia, unspecified: Secondary | ICD-10-CM

## 2014-06-20 DIAGNOSIS — M25569 Pain in unspecified knee: Secondary | ICD-10-CM | POA: Insufficient documentation

## 2014-06-20 MED ORDER — MELOXICAM 7.5 MG PO TABS: 7.5000 mg | ORAL_TABLET | Freq: Every day | ORAL | Status: DC

## 2014-06-20 NOTE — Progress Notes (Signed)
   Subjective:    Patient ID: Lori Ortiz, female    DOB: 1956/01/19, 59 y.o.   MRN: [id number]  HPI 59 year old female who presents with left knee pain. Is no history of injury or trauma. Pain is more on the lateral aspect of the knee. It's worse at the end of the day after she has stood on her legs all day working in [employer]. There has been some swelling. There is no locking or giving way by history  She also requests that we check lipids today since it's been 1 year since that has been examined. She does take simvastatin Chief Complaint  Patient presents with  . Knee Pain    Left knee pain x 2 weeks with no known injury.    Patient Active Problem List   Diagnosis Date Noted  . Hypertension 05/29/2010  . Hyperlipidemia 05/29/2010  . GERD (gastroesophageal reflux disease) 05/29/2010  . Osteopenia 05/29/2010   Outpatient Encounter Prescriptions as of 06/20/2014  Medication Sig  . lisinopril (PRINIVIL,ZESTRIL) 20 MG tablet TAKE 1 TABLET (20 MG TOTAL) BY MOUTH DAILY.  . metoprolol tartrate (LOPRESSOR) 25 MG tablet Take 1 tablet (25 mg total) by mouth 2 (two) times daily.  . ranitidine (ZANTAC) 150 MG tablet Take 150 mg by mouth 2 (two) times daily.    . simvastatin (ZOCOR) 40 MG tablet Take 1 tablet (40 mg total) by mouth daily at 6 PM.  . [DISCONTINUED] albuterol (PROVENTIL HFA;VENTOLIN HFA) 108 (90 BASE) MCG/ACT inhaler Inhale 2 puffs into the lungs every 6 (six) hours as needed for wheezing or shortness of breath.  . [DISCONTINUED] budesonide-formoterol (SYMBICORT) 160-4.5 MCG/ACT inhaler Inhale 2 puffs into the lungs 2 (two) times daily.  . [DISCONTINUED] budesonide-formoterol (SYMBICORT) 80-4.5 MCG/ACT inhaler Inhale 2 puffs into the lungs 2 (two) times daily.      Review of Systems  Constitutional: Negative.   HENT: Negative.   Eyes: Negative.   Respiratory: Negative.   Cardiovascular: Negative.   Gastrointestinal: Negative.   Endocrine: Negative.   Genitourinary: Negative.    Musculoskeletal: Positive for arthralgias.  Hematological: Negative.   Psychiatric/Behavioral: Negative.        Objective:   Physical Exam  Musculoskeletal:  Left knee: There is some effusion present there is tenderness along the lateral joint line. It is stable to stress. There is no tenderness over the patella and quad function seems normal  X-ray shows no evidence of degenerative changes    BP 157/70 mmHg  Pulse 58  Temp(Src) 97.8 F (36.6 C) (Oral)  Ht 5\' 9"  (1.753 m)  Wt 255 lb (115.667 kg)  BMI 37.64 kg/m2       Assessment & Plan:  1. Knee pain, acute, left Without history of trauma and with negative x-ray findings I suspect her pain is related to some early degenerative process. We talked about treatment options. Will try meloxicam if in 2 weeks symptoms are improved consider injection - DG Knee 1-2 Views Left; Future  2. Essential hypertension Blood pressure is fairly well controlled on lisinopril and metoprolol  3. Hyperlipidemia We'll check lipids and liver today

## 2014-06-21 LAB — CMP14+EGFR
ALT: 8 IU/L (ref 0–32)
AST: 11 IU/L (ref 0–40)
Albumin/Globulin Ratio: 1.6 (ref 1.1–2.5)
Albumin: 3.7 g/dL (ref 3.5–5.5)
Alkaline Phosphatase: 89 IU/L (ref 39–117)
BUN/Creatinine Ratio: 18 (ref 9–23)
BUN: 24 mg/dL (ref 6–24)
Bilirubin Total: <0.2 mg/dL (ref 0.0–1.2)
CALCIUM: 9.4 mg/dL (ref 8.7–10.2)
CO2: 25 mmol/L (ref 18–29)
CREATININE: 1.36 mg/dL — AB (ref 0.57–1.00)
Chloride: 107 mmol/L (ref 97–108)
GFR calc Af Amer: 49 mL/min/{1.73_m2} — ABNORMAL LOW (ref >59)
GFR, EST NON AFRICAN AMERICAN: 43 mL/min/{1.73_m2} — AB (ref >59)
Globulin, Total: 2.3 g/dL (ref 1.5–4.5)
Glucose: 92 mg/dL (ref 65–99)
Potassium: 4.6 mmol/L (ref 3.5–5.2)
SODIUM: 146 mmol/L — AB (ref 134–144)
Total Protein: 6 g/dL (ref 6.0–8.5)

## 2014-06-21 LAB — LIPID PANEL
CHOL/HDL RATIO: 6 ratio — AB (ref 0.0–4.4)
Cholesterol, Total: 175 mg/dL (ref 100–199)
HDL: 29 mg/dL — ABNORMAL LOW (ref >39)
LDL CALC: 96 mg/dL (ref 0–99)
Triglycerides: 252 mg/dL — ABNORMAL HIGH (ref 0–149)
VLDL Cholesterol Cal: 50 mg/dL — ABNORMAL HIGH (ref 5–40)

## 2014-07-04 ENCOUNTER — Telehealth: Payer: Self-pay | Admitting: Family Medicine

## 2014-07-04 NOTE — Telephone Encounter (Signed)
Appointment given for 11:30 tomorrow with Sabra Heck.

## 2014-07-05 ENCOUNTER — Ambulatory Visit (INDEPENDENT_AMBULATORY_CARE_PROVIDER_SITE_OTHER): Payer: PRIVATE HEALTH INSURANCE | Admitting: Family Medicine

## 2014-07-05 ENCOUNTER — Encounter: Payer: Self-pay | Admitting: Family Medicine

## 2014-07-05 VITALS — BP 175/74 | HR 62 | Temp 97.1°F | Ht 69.0 in | Wt 255.0 lb

## 2014-07-05 DIAGNOSIS — M25562 Pain in left knee: Secondary | ICD-10-CM

## 2014-07-05 NOTE — Progress Notes (Signed)
   Subjective:    Patient ID: [name], female    DOB: 27-Jan-1956, 59 y.o.   MRN: [id number]  HPI 59 year old female with left knee pain. She was seen several weeks ago and given a prescription for meloxicam. It seemed to help a little bit but the plan was for her to return today for an injection of cortisone if it was not significant improvement and she presents today for that injection  Patient Active Problem List   Diagnosis Date Noted  . Knee pain, acute 06/20/2014  . Hypertension 05/29/2010  . Hyperlipidemia 05/29/2010  . GERD (gastroesophageal reflux disease) 05/29/2010  . Osteopenia 05/29/2010   Outpatient Encounter Prescriptions as of 07/05/2014  Medication Sig  . lisinopril (PRINIVIL,ZESTRIL) 20 MG tablet TAKE 1 TABLET (20 MG TOTAL) BY MOUTH DAILY.  . meloxicam (MOBIC) 7.5 MG tablet Take 1 tablet (7.5 mg total) by mouth daily.  . metoprolol tartrate (LOPRESSOR) 25 MG tablet Take 1 tablet (25 mg total) by mouth 2 (two) times daily.  . ranitidine (ZANTAC) 150 MG tablet Take 150 mg by mouth 2 (two) times daily.    . simvastatin (ZOCOR) 40 MG tablet Take 1 tablet (40 mg total) by mouth daily at 6 PM.   No facility-administered encounter medications on file as of 07/05/2014.      Review of Systems  Constitutional: Negative.   Respiratory: Negative.   Cardiovascular: Negative.   Musculoskeletal: Positive for arthralgias.  Neurological: Negative.        Objective:   Physical Exam  Musculoskeletal:  The left knee was prepped with alcohol pad, the joint space was palpated and using the left medial approach 80 mg of Depo-Medrol and 1 mL of Marcaine was injected.  We discussed plans from here. If this helps, we could do this at 3 or 4 month intervals. If there is no relief. I would like to get her to see Dr. Elie Confer or a Reynaldo Minium sometime in the next 1 or 2 months.    BP 175/74 mmHg  Pulse 62  Temp(Src) 97.1 F (36.2 C) (Oral)  Ht 5\' 9"  (1.753 m)  Wt 255 lb (115.667 kg)   BMI 37.64 kg/m2       Assessment & Plan:  1. Knee pain, acute, left I think the pain may be related to degenerative changes. On x-ray there was mention of what's called a compartment syndrome. As noted above, the knee was injected today and plans are to repeat injection if helpful or refer to orthopedics if not helpful. Wardell Honour MD

## 2014-07-14 ENCOUNTER — Other Ambulatory Visit: Payer: Self-pay | Admitting: Family Medicine

## 2014-11-01 ENCOUNTER — Encounter: Payer: Self-pay | Admitting: Family Medicine

## 2014-11-01 ENCOUNTER — Other Ambulatory Visit: Payer: Self-pay | Admitting: *Deleted

## 2014-11-01 ENCOUNTER — Ambulatory Visit (INDEPENDENT_AMBULATORY_CARE_PROVIDER_SITE_OTHER): Payer: PRIVATE HEALTH INSURANCE | Admitting: Family Medicine

## 2014-11-01 ENCOUNTER — Encounter (INDEPENDENT_AMBULATORY_CARE_PROVIDER_SITE_OTHER): Payer: Self-pay

## 2014-11-01 VITALS — BP 178/75 | HR 64 | Temp 98.0°F | Ht 69.0 in | Wt 249.0 lb

## 2014-11-01 DIAGNOSIS — I10 Essential (primary) hypertension: Secondary | ICD-10-CM

## 2014-11-01 DIAGNOSIS — E785 Hyperlipidemia, unspecified: Secondary | ICD-10-CM | POA: Diagnosis not present

## 2014-11-01 MED ORDER — METOPROLOL TARTRATE 25 MG PO TABS
25.0000 mg | ORAL_TABLET | Freq: Two times a day (BID) | ORAL | Status: DC
Start: 2014-11-01 — End: 2014-11-01

## 2014-11-01 MED ORDER — LISINOPRIL 20 MG PO TABS: 20.0000 mg | ORAL_TABLET | Freq: Every day | ORAL | Status: DC

## 2014-11-01 MED ORDER — METOPROLOL TARTRATE 50 MG PO TABS: 50.0000 mg | ORAL_TABLET | Freq: Two times a day (BID) | ORAL | Status: DC

## 2014-11-01 MED ORDER — SIMVASTATIN 40 MG PO TABS: 40.0000 mg | ORAL_TABLET | Freq: Every day | ORAL | Status: DC

## 2014-11-01 NOTE — Progress Notes (Signed)
   Subjective:    Patient ID: Lori Ortiz, female    DOB: 08-Jun-1955, 59 y.o.   MRN: [id number]  HPI  59 year old female here to follow-up hypertension, hyperlipidemia, and knee pain secondary to degenerative arthritis. She had good results from injections of her knees at her last visit in May and does not feel like she needs to repeat that again today, but she knows that if pain gets bad that we can repeat at intervals. She denies any swelling in the knees.  At her last visit and again today systolic blood pressures have been 175 and 0000000 but diastolics are well controlled at 75. Current medications include lisinopril and metoprolol. Lipids were assessed in April 2016 and LDL is at goal  Patient Active Problem List   Diagnosis Date Noted  . Knee pain, acute 06/20/2014  . Hypertension 05/29/2010  . Hyperlipidemia 05/29/2010  . GERD (gastroesophageal reflux disease) 05/29/2010  . Osteopenia 05/29/2010   Outpatient Encounter Prescriptions as of 11/01/2014  Medication Sig  . lisinopril (PRINIVIL,ZESTRIL) 20 MG tablet TAKE 1 TABLET (20 MG TOTAL) BY MOUTH DAILY.  . meloxicam (MOBIC) 7.5 MG tablet Take 1 tablet (7.5 mg total) by mouth daily.  . metoprolol tartrate (LOPRESSOR) 25 MG tablet Take 1 tablet (25 mg total) by mouth 2 (two) times daily.  . ranitidine (ZANTAC) 150 MG tablet Take 150 mg by mouth 2 (two) times daily.    . simvastatin (ZOCOR) 40 MG tablet Take 1 tablet (40 mg total) by mouth daily at 6 PM.   No facility-administered encounter medications on file as of 11/01/2014.      Review of Systems  Constitutional: Negative.   Respiratory: Negative.   Cardiovascular: Negative.   Neurological: Negative.   Psychiatric/Behavioral: Negative.        Objective:   Physical Exam  Constitutional: She is oriented to person, place, and time. She appears well-developed and well-nourished.  Cardiovascular: Normal rate and regular rhythm.   Pulmonary/Chest: Effort normal and breath sounds  normal.  Musculoskeletal:  No crepitance appreciated with flexion and extension of knees  Neurological: She is alert and oriented to person, place, and time.    BP 178/75 mmHg  Pulse 64  Temp(Src) 98 F (36.7 C) (Oral)  Ht 5\' 9"  (1.753 m)  Wt 249 lb (112.946 kg)  BMI 36.75 kg/m2       Assessment & Plan:  1. Essential hypertension Pressure needs better control. Plan to increase metoprolol to 50 mg twice a day  2. Hyperlipidemia Reading statin and LDL is at goal - simvastatin (ZOCOR) 40 MG tablet; Take 1 tablet (40 mg total) by mouth daily at 6 PM.  Dispense: 90 tablet; Refill: 1  3. Hypertension, essential See above for blood pressure adjustment - metoprolol tartrate (LOPRESSOR) 50 MG tablet; Take 1 tablet (50 mg total) by mouth 2 (two) times daily.  Dispense: 180 tablet; Refill: 1  Wardell Honour MD

## 2014-12-05 ENCOUNTER — Encounter: Payer: Self-pay | Admitting: Family Medicine

## 2014-12-05 ENCOUNTER — Ambulatory Visit (INDEPENDENT_AMBULATORY_CARE_PROVIDER_SITE_OTHER): Payer: PRIVATE HEALTH INSURANCE | Admitting: Family Medicine

## 2014-12-05 VITALS — BP 188/79 | HR 52 | Temp 97.1°F | Ht 69.0 in | Wt 252.6 lb

## 2014-12-05 DIAGNOSIS — J069 Acute upper respiratory infection, unspecified: Secondary | ICD-10-CM | POA: Diagnosis not present

## 2014-12-05 MED ORDER — BENZONATATE 100 MG PO CAPS: 100.0000 mg | ORAL_CAPSULE | Freq: Two times a day (BID) | ORAL | Status: DC | PRN

## 2014-12-05 MED ORDER — HYDROCODONE-HOMATROPINE 5-1.5 MG/5ML PO SYRP: 5.0000 mL | ORAL_SOLUTION | Freq: Four times a day (QID) | ORAL | Status: DC | PRN

## 2014-12-05 NOTE — Progress Notes (Signed)
   HPI  Patient presents today here with cough and cold.  Patient's when she's had one week of nasal congestion, cough, sore throat, and headache. She denies dyspnea, chest pain, or fever.  She has had malaise and difficulty sleeping at night due to cough. She feels very tired. She works as a Freight forwarder at [employer]. 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  PMH: Smoking status noted ROS: Per HPI  Objective: BP 188/79 mmHg  Pulse 52  Temp(Src) 97.1 F (36.2 C) (Oral)  Ht 5\' 9"  (1.753 m)  Wt 252 lb 9.6 oz (114.579 kg)  BMI 37.29 kg/m2 Gen: NAD, alert, cooperative with exam HEENT: NCAT, swollen nasal mucosa bilaterally, TMs normal bilaterally, oropharynx ear CV: RRR, good S1/S2, no murmur Resp: CTABL, no wheezes, non-labored Ext: No edema, warm Neuro: Alert and oriented, No gross deficits  Assessment and plan:  # Upper respiratory infection, likely viral Treat with cough suppressants, Hycodan at night, Tessalon Perles daytime Discussed red flags for return including not improving within the next 3 days   Meds ordered this encounter  Medications  . HYDROcodone-homatropine (HYCODAN) 5-1.5 MG/5ML syrup    Sig: Take 5 mLs by mouth every 6 (six) hours as needed for cough.    Dispense:  120 mL    Refill:  0  . benzonatate (TESSALON) 100 MG capsule    Sig: Take 1 capsule (100 mg total) by mouth 2 (two) times daily as needed for cough.    Dispense:  20 capsule    Refill:  [place], MD [facility] 12/05/2014, 8:56 AM

## 2014-12-05 NOTE — Patient Instructions (Signed)
Great to meet you!  If you are not improving by friday please come back.  If you develop chest pain, shortness of breath, or persistent fevers please come back .  Be sure to avoid medicines with the D- as in mucinex D etc, these can elevate your blood pressure, Coricidin HBP is a good alternative  Upper Respiratory Infection, Adult Most upper respiratory infections (URIs) are a viral infection of the air passages leading to the lungs. A URI affects the nose, throat, and upper air passages. The most common type of URI is nasopharyngitis and is typically referred to as "the common cold." URIs run their course and usually go away on their own. Most of the time, a URI does not require medical attention, but sometimes a bacterial infection in the upper airways can follow a viral infection. This is called a secondary infection. Sinus and middle ear infections are common types of secondary upper respiratory infections. Bacterial pneumonia can also complicate a URI. A URI can worsen asthma and chronic obstructive pulmonary disease (COPD). Sometimes, these complications can require emergency medical care and may be life threatening.  CAUSES Almost all URIs are caused by viruses. A virus is a type of germ and can spread from one person to another.  RISKS FACTORS You may be at risk for a URI if:   You smoke.   You have chronic heart or lung disease.  You have a weakened defense (immune) system.   You are very young or very old.   You have nasal allergies or asthma.  You work in crowded or poorly ventilated areas.  You work in health care facilities or schools. SIGNS AND SYMPTOMS  Symptoms typically develop 2-3 days after you come in contact with a cold virus. Most viral URIs last 7-10 days. However, viral URIs from the influenza virus (flu virus) can last 14-18 days and are typically more severe. Symptoms may include:   Runny or stuffy (congested) nose.   Sneezing.   Cough.   Sore  throat.   Headache.   Fatigue.   Fever.   Loss of appetite.   Pain in your forehead, behind your eyes, and over your cheekbones (sinus pain).  Muscle aches.  DIAGNOSIS  Your health care provider may diagnose a URI by:  Physical exam.  Tests to check that your symptoms are not due to another condition such as:  Strep throat.  Sinusitis.  Pneumonia.  Asthma. TREATMENT  A URI goes away on its own with time. It cannot be cured with medicines, but medicines may be prescribed or recommended to relieve symptoms. Medicines may help:  Reduce your fever.  Reduce your cough.  Relieve nasal congestion. HOME CARE INSTRUCTIONS   Take medicines only as directed by your health care provider.   Gargle warm saltwater or take cough drops to comfort your throat as directed by your health care provider.  Use a warm mist humidifier or inhale steam from a shower to increase air moisture. This may make it easier to breathe.  Drink enough fluid to keep your urine clear or pale yellow.   Eat soups and other clear broths and maintain good nutrition.   Rest as needed.   Return to work when your temperature has returned to normal or as your health care provider advises. You may need to stay home longer to avoid infecting others. You can also use a face mask and careful hand washing to prevent spread of the virus.  Increase the usage of your  inhaler if you have asthma.   Do not use any tobacco products, including cigarettes, chewing tobacco, or electronic cigarettes. If you need help quitting, ask your health care provider. PREVENTION  The best way to protect yourself from getting a cold is to practice good hygiene.   Avoid oral or hand contact with people with cold symptoms.   Wash your hands often if contact occurs.  There is no clear evidence that vitamin C, vitamin E, echinacea, or exercise reduces the chance of developing a cold. However, it is always recommended to  get plenty of rest, exercise, and practice good nutrition.  SEEK MEDICAL CARE IF:   You are getting worse rather than better.   Your symptoms are not controlled by medicine.   You have chills.  You have worsening shortness of breath.  You have brown or red mucus.  You have yellow or brown nasal discharge.  You have pain in your face, especially when you bend forward.  You have a fever.  You have swollen neck glands.  You have pain while swallowing.  You have white areas in the back of your throat. SEEK IMMEDIATE MEDICAL CARE IF:   You have severe or persistent:  Headache.  Ear pain.  Sinus pain.  Chest pain.  You have chronic lung disease and any of the following:  Wheezing.  Prolonged cough.  Coughing up blood.  A change in your usual mucus.  You have a stiff neck.  You have changes in your:  Vision.  Hearing.  Thinking.  Mood. MAKE SURE YOU:   Understand these instructions.  Will watch your condition.  Will get help right away if you are not doing well or get worse.   This information is not intended to replace advice given to you by your health care provider. Make sure you discuss any questions you have with your health care provider.   Document Released: 08/06/2000 Document Revised: 06/27/2014 Document Reviewed: 05/18/2013 Elsevier Interactive Patient Education [employer].   .

## 2014-12-13 ENCOUNTER — Telehealth: Payer: Self-pay | Admitting: Nurse Practitioner

## 2015-03-19 ENCOUNTER — Encounter: Payer: Self-pay | Admitting: Family Medicine

## 2015-03-19 ENCOUNTER — Ambulatory Visit (INDEPENDENT_AMBULATORY_CARE_PROVIDER_SITE_OTHER): Payer: PRIVATE HEALTH INSURANCE | Admitting: Family Medicine

## 2015-03-19 VITALS — BP 173/81 | HR 50 | Temp 97.7°F | Ht 69.0 in | Wt 254.0 lb

## 2015-03-19 DIAGNOSIS — I10 Essential (primary) hypertension: Secondary | ICD-10-CM | POA: Diagnosis not present

## 2015-03-19 DIAGNOSIS — J069 Acute upper respiratory infection, unspecified: Secondary | ICD-10-CM | POA: Diagnosis not present

## 2015-03-19 DIAGNOSIS — M25562 Pain in left knee: Secondary | ICD-10-CM

## 2015-03-19 MED ORDER — HYDROCODONE-HOMATROPINE 5-1.5 MG/5ML PO SYRP: 5.0000 mL | ORAL_SOLUTION | Freq: Three times a day (TID) | ORAL | Status: DC | PRN

## 2015-03-19 MED ORDER — DOXYCYCLINE HYCLATE 100 MG PO TABS: 100.0000 mg | ORAL_TABLET | Freq: Two times a day (BID) | ORAL | Status: DC

## 2015-03-19 MED ORDER — AMLODIPINE BESYLATE 5 MG PO TABS: 5.0000 mg | ORAL_TABLET | Freq: Every day | ORAL | Status: DC

## 2015-03-19 NOTE — Progress Notes (Signed)
Subjective:    Patient ID: Lori Ortiz, female    DOB: [birth year], 60 y.o.   MRN: [id number]  HPI Patient here today for cough, congestion, left knee pain and reflux. She is taking medications regularly. She was treated for bronchitis about 3 weeks ago in urgent care and given Augmentin. Symptoms never really got better. She is working out of town at [employer]. She is on her feet much of the day in complaining of pain in her left knee. This is chronic and has been injected here previously with success.    Patient Active Problem List   Diagnosis Date Noted  . URI, acute 12/05/2014  . Knee pain, acute 06/20/2014  . Hypertension 05/29/2010  . Hyperlipidemia 05/29/2010  . GERD (gastroesophageal reflux disease) 05/29/2010  . Osteopenia 05/29/2010   Outpatient Encounter Prescriptions as of 03/19/2015  Medication Sig  . lisinopril (PRINIVIL,ZESTRIL) 20 MG tablet Take 1 tablet (20 mg total) by mouth daily.  . metoprolol tartrate (LOPRESSOR) 50 MG tablet Take 1 tablet (50 mg total) by mouth 2 (two) times daily.  . ranitidine (ZANTAC) 150 MG tablet Take 150 mg by mouth 2 (two) times daily.    . simvastatin (ZOCOR) 40 MG tablet Take 1 tablet (40 mg total) by mouth daily at 6 PM.  . [DISCONTINUED] benzonatate (TESSALON) 100 MG capsule Take 1 capsule (100 mg total) by mouth 2 (two) times daily as needed for cough.  . [DISCONTINUED] HYDROcodone-homatropine (HYCODAN) 5-1.5 MG/5ML syrup Take 5 mLs by mouth every 6 (six) hours as needed for cough.   No facility-administered encounter medications on file as of 03/19/2015.     Review of Systems  Constitutional: Negative.   HENT: Positive for congestion.   Eyes: Negative.   Respiratory: Positive for cough.   Cardiovascular: Negative.   Gastrointestinal: Negative.        Reflux  Endocrine: Negative.   Genitourinary: Negative.   Musculoskeletal: Positive for arthralgias (left knee pain).  Skin: Negative.   Allergic/Immunologic:  Negative.   Neurological: Negative.   Hematological: Negative.   Psychiatric/Behavioral: Negative.        Objective:   Physical Exam  Constitutional: She is oriented to person, place, and time. She appears well-developed and well-nourished.  Cardiovascular: Normal rate, regular rhythm and normal heart sounds.   Pulmonary/Chest: Effort normal and breath sounds normal.  Abdominal: Soft. Bowel sounds are normal.  Musculoskeletal:  Left knee injected with Depo-Medrol and Marcaine 1 mL of each after skin prep with alcohol. Patient tolerated this well without side effects.  Neurological: She is alert and oriented to person, place, and time.    BP 173/81 mmHg  Pulse 50  Temp(Src) 97.7 F (36.5 C) (Oral)  Ht 5\' 9"  (1.753 m)  Wt 254 lb (115.214 kg)  BMI 37.49 kg/m2       Assessment & Plan:  1. Essential hypertension Patient's blood pressure has not been well controlled at last visit or today. She admits to forgetting her medicines sometimes. Currently she takes lisinopril and metoprolol. In an effort to gain better control will add amlodipine 5 mg. Stressed the need of taking medicines as prescribed  2. Knee pain, acute, left Knee injected as described above. Problem is degenerative arthritis  3. URI, acute Symptoms have lasted now for 3-4 weeks would wonder about allergic etiology but we'll try one more course of antibiotic: Doxycycline 100 mg twice a day Mucinex to loosen up congestion and Hycodan to take as a bedtime suppressant  Annie Main  Loleta Chance MD

## 2015-04-10 ENCOUNTER — Telehealth: Payer: Self-pay | Admitting: Nurse Practitioner

## 2015-05-02 ENCOUNTER — Encounter: Payer: Self-pay | Admitting: Pediatrics

## 2015-05-02 ENCOUNTER — Ambulatory Visit (INDEPENDENT_AMBULATORY_CARE_PROVIDER_SITE_OTHER): Payer: PRIVATE HEALTH INSURANCE | Admitting: Pediatrics

## 2015-05-02 VITALS — BP 163/100 | HR 65 | Temp 97.1°F | Ht 69.0 in | Wt 249.8 lb

## 2015-05-02 DIAGNOSIS — R030 Elevated blood-pressure reading, without diagnosis of hypertension: Secondary | ICD-10-CM

## 2015-05-02 DIAGNOSIS — R059 Cough, unspecified: Secondary | ICD-10-CM

## 2015-05-02 DIAGNOSIS — IMO0001 Reserved for inherently not codable concepts without codable children: Secondary | ICD-10-CM

## 2015-05-02 DIAGNOSIS — R6889 Other general symptoms and signs: Secondary | ICD-10-CM

## 2015-05-02 DIAGNOSIS — R05 Cough: Secondary | ICD-10-CM | POA: Diagnosis not present

## 2015-05-02 LAB — VERITOR FLU A/B WAIVED
Influenza A: NEGATIVE
Influenza B: NEGATIVE

## 2015-05-02 MED ORDER — AMLODIPINE BESYLATE 10 MG PO TABS: 10.0000 mg | ORAL_TABLET | Freq: Every day | ORAL | Status: DC

## 2015-05-02 MED ORDER — ONDANSETRON 4 MG PO TBDP: 4.0000 mg | ORAL_TABLET | Freq: Three times a day (TID) | ORAL | Status: DC | PRN

## 2015-05-02 MED ORDER — BENZONATATE 200 MG PO CAPS: 200.0000 mg | ORAL_CAPSULE | Freq: Two times a day (BID) | ORAL | Status: DC | PRN

## 2015-05-02 NOTE — Patient Instructions (Signed)
Tylenol--500mg  four times a day

## 2015-05-02 NOTE — Progress Notes (Signed)
    Subjective:    Patient ID: Lori Ortiz, female    DOB: 03-05-1955, 60 y.o.   MRN: [id number]  CC: Cough; Nasal Congestion; and Generalized Body Aches   HPI: Lori Ortiz is a 60 y.o. female presenting for Cough; Nasal Congestion; and Generalized Body Aches  Sick starting 8 days ago. Started throwing up after work that day Lots of vomiting in the days that followed Some congestion Feeling hot and cold Lots of coughing Threw up yesterday AM last No abd pain Drinking as much as she can, has been staying down Urinating normally  Throat sore, hard to fall asleep because of coughing   Relevant past medical, surgical, family and social history reviewed and updated as indicated. Interim medical history since our last visit reviewed. Allergies and medications reviewed and updated.    ROS: Per HPI unless specifically indicated above  History  Smoking status  . Current Every Day Smoker -- 0.50 packs/day  Smokeless tobacco  . Never Used    Past Medical History Patient Active Problem List   Diagnosis Date Noted  . URI, acute 12/05/2014  . Knee pain, acute 06/20/2014  . Hypertension 05/29/2010  . Hyperlipidemia 05/29/2010  . GERD (gastroesophageal reflux disease) 05/29/2010  . Osteopenia 05/29/2010       Objective:    BP 163/100 mmHg  Pulse 65  Temp(Src) 97.1 F (36.2 C) (Oral)  Ht 5\' 9"  (1.753 m)  Wt 249 lb 12.8 oz (113.309 kg)  BMI 36.87 kg/m2  Wt Readings from Last 3 Encounters:  05/02/15 249 lb 12.8 oz (113.309 kg)  03/19/15 254 lb (115.214 kg)  12/05/14 252 lb 9.6 oz (114.579 kg)     Gen: NAD, alert, cooperative with exam, NCAT EYES: EOMI, no scleral injection or icterus ENT:  TMs pink b/l, nl LR, OP with mild erythema LYMPH: no cervical LAD CV: NRRR, normal S1/S2, no murmur, distal pulses 2+ b/l Resp: CTABL, no wheezes, normal WOB Abd: +BS, soft, NTND. no guarding or organomegaly Ext: trace pitting edema, warm Neuro: Alert and oriented, strength equal  b/l UE and LE, coordination grossly normal MSK: normal muscle bulk     Assessment & Plan:    Lori Ortiz was seen today for cough, nasal congestion and generalized body aches. Flu negative, though with convincing flu-like symptoms. Pt has been sick for a week, appetite improving but still with some nausea. Didn't offer tamiflu due to length of time she has been sick. Discussed symptomatic care.  Diagnoses and all orders for this visit:  Flu-like symptoms -     Veritor Flu A/B Waived  -     ondansetron (ZOFRAN-ODT) 4 MG disintegrating tablet; Take 1 tablet (4 mg total) by mouth every 8 (eight) hours as needed for nausea or vomiting. -     benzonatate (TESSALON) 200 MG capsule; Take 1 capsule (200 mg total) by mouth 2 (two) times daily as needed for cough.  Essential HTN, Elevated blood pressure Uncontrolled. Has been elevated last few visits. Cr 1.3 eleven months ago. Increase amlodipine to 10mg  daily, needs labs next visit.  -     amLODipine (NORVASC) 10 MG tablet; Take 1 tablet (10 mg total) by mouth daily.  Cough Tessalon pearles, discussed symptomatic care   Follow up plan: Return in about 4 weeks (around 05/30/2015).  Assunta Found, MD [facility] 05/02/2015, 8:39 AM

## 2015-05-28 ENCOUNTER — Other Ambulatory Visit: Payer: Self-pay | Admitting: Family Medicine

## 2015-06-30 ENCOUNTER — Other Ambulatory Visit: Payer: Self-pay | Admitting: Family Medicine

## 2015-07-09 ENCOUNTER — Encounter: Payer: Self-pay | Admitting: Physician Assistant

## 2015-07-09 ENCOUNTER — Ambulatory Visit (INDEPENDENT_AMBULATORY_CARE_PROVIDER_SITE_OTHER): Payer: PRIVATE HEALTH INSURANCE | Admitting: Physician Assistant

## 2015-07-09 VITALS — BP 156/94 | HR 56 | Temp 97.7°F | Ht 69.0 in | Wt 255.0 lb

## 2015-07-09 DIAGNOSIS — H6593 Unspecified nonsuppurative otitis media, bilateral: Secondary | ICD-10-CM

## 2015-07-09 MED ORDER — AMOXICILLIN-POT CLAVULANATE 875-125 MG PO TABS: 1.0000 | ORAL_TABLET | Freq: Two times a day (BID) | ORAL | Status: DC

## 2015-07-09 NOTE — Progress Notes (Signed)
Subjective:     Patient ID: Lori Ortiz, female   DOB: [birth year], 60 y.o.   MRN: [id number]  HPI Pt with cough , congestion and bilat ear fullness x 2-3 weeks No meds for sx  Review of Systems  Constitutional: Negative.   HENT: Positive for congestion, hearing loss, postnasal drip and sinus pressure. Negative for ear pain, nosebleeds, rhinorrhea and sore throat.   Respiratory: Positive for cough. Negative for chest tightness, shortness of breath and wheezing.   Cardiovascular: Negative.        Objective:   Physical Exam  Constitutional: She appears well-developed and well-nourished.  HENT:  Right Ear: External ear normal.  Left Ear: External ear normal.  Mouth/Throat: Oropharynx is clear and moist. No oropharyngeal exudate.  TM's with fluid bilat and loss of landmarks  Neck: Neck supple. No JVD present.  Cardiovascular: Normal rate, regular rhythm and normal heart sounds.   Pulmonary/Chest: Effort normal and breath sounds normal.  Lymphadenopathy:    She has no cervical adenopathy.  Nursing note and vitals reviewed.      Assessment:     Otitis media with effusion, bilateral      Plan:     Fluids Rest OTC meds for sx Augmentin 875mg  bid F/U prn

## 2015-07-09 NOTE — Patient Instructions (Signed)

## 2015-08-24 ENCOUNTER — Ambulatory Visit (INDEPENDENT_AMBULATORY_CARE_PROVIDER_SITE_OTHER): Payer: PRIVATE HEALTH INSURANCE | Admitting: Family Medicine

## 2015-08-24 ENCOUNTER — Encounter: Payer: Self-pay | Admitting: Family Medicine

## 2015-08-24 VITALS — BP 134/60 | HR 53 | Temp 97.1°F | Ht 69.0 in | Wt 255.0 lb

## 2015-08-24 DIAGNOSIS — J209 Acute bronchitis, unspecified: Secondary | ICD-10-CM

## 2015-08-24 MED ORDER — HYDROCODONE-HOMATROPINE 5-1.5 MG/5ML PO SYRP: 5.0000 mL | ORAL_SOLUTION | Freq: Four times a day (QID) | ORAL | Status: DC | PRN

## 2015-08-24 MED ORDER — AZITHROMYCIN 250 MG PO TABS: ORAL_TABLET | ORAL | Status: DC

## 2015-08-24 MED ORDER — PREDNISONE 20 MG PO TABS: ORAL_TABLET | ORAL | Status: DC

## 2015-08-24 NOTE — Progress Notes (Signed)
BP 134/60 mmHg  Pulse 53  Temp(Src) 97.1 F (36.2 C) (Oral)  Ht 5\' 9"  (1.753 m)  Wt 255 lb (115.667 kg)  BMI 37.64 kg/m2   Subjective:    Patient ID: [name], female    DOB: [birth year], 60 y.o.   MRN: [id number]  HPI: Zylphia Sergio is a 60 y.o. female presenting on 08/24/2015 for Chest congestion and Cough   HPI Chest congestion and cough Patient has been having chest congestion and cough is been getting worse over the past week. She denies any fevers or chills or shortness of breath or wheezing. She does smoke half pack per day and has been smoking for 30 years or more. She has never been diagnosed with COPD or emphysema but has been given Symbicort and Proventil to use when she gets these flareups. She has not been using them currently. She is some Tylenol Sinus and cold but it did not help much. Her cough is been mostly nonproductive but occasionally productive of yellow-green sputum.  Relevant past medical, surgical, family and social history reviewed and updated as indicated. Interim medical history since our last visit reviewed. Allergies and medications reviewed and updated.  Review of Systems  Constitutional: Negative for fever and chills.  HENT: Positive for congestion, postnasal drip, rhinorrhea, sinus pressure, sneezing and sore throat. Negative for ear discharge and ear pain.   Eyes: Negative for pain, redness and visual disturbance.  Respiratory: Positive for cough. Negative for chest tightness and shortness of breath.   Cardiovascular: Negative for chest pain and leg swelling.  Genitourinary: Negative for dysuria and difficulty urinating.  Musculoskeletal: Negative for back pain and gait problem.  Skin: Negative for rash.  Neurological: Negative for light-headedness and headaches.  Psychiatric/Behavioral: Negative for behavioral problems and agitation.  All other systems reviewed and are negative.   Per HPI unless specifically indicated above     Medication  List       This list is accurate as of: 08/24/15  1:52 PM.  Always use your most recent med list.               amLODipine 10 MG tablet  Commonly known as:  NORVASC  Take 1 tablet (10 mg total) by mouth daily.     azithromycin 250 MG tablet  Commonly known as:  ZITHROMAX  Take 2 the first day and then one each day after.     HYDROcodone-homatropine 5-1.5 MG/5ML syrup  Commonly known as:  HYCODAN  Take 5 mLs by mouth every 6 (six) hours as needed for cough.     lisinopril 20 MG tablet  Commonly known as:  PRINIVIL,ZESTRIL  TAKE 1 TABLET (20 MG TOTAL) BY MOUTH DAILY.     metoprolol 50 MG tablet  Commonly known as:  LOPRESSOR  TAKE 1 TABLET (50 MG TOTAL) BY MOUTH 2 (TWO) TIMES DAILY.     predniSONE 20 MG tablet  Commonly known as:  DELTASONE  2 po at same time daily for 5 days     ranitidine 150 MG tablet  Commonly known as:  ZANTAC  Take 150 mg by mouth 2 (two) times daily.     simvastatin 40 MG tablet  Commonly known as:  ZOCOR  Take 1 tablet (40 mg total) by mouth daily at 6 PM.     TYLENOL COUGH/SORE THROAT PO  Take by mouth.           Objective:    BP 134/60 mmHg  Pulse 53  Temp(Src)  97.1 F (36.2 C) (Oral)  Ht 5\' 9"  (1.753 m)  Wt 255 lb (115.667 kg)  BMI 37.64 kg/m2  Wt Readings from Last 3 Encounters:  08/24/15 255 lb (115.667 kg)  07/09/15 255 lb (115.667 kg)  05/02/15 249 lb 12.8 oz (113.309 kg)    Physical Exam  Constitutional: She is oriented to person, place, and time. She appears well-developed and well-nourished. No distress.  HENT:  Right Ear: Tympanic membrane, external ear and ear canal normal.  Left Ear: Tympanic membrane, external ear and ear canal normal.  Nose: Mucosal edema and rhinorrhea present. No epistaxis. Right sinus exhibits no maxillary sinus tenderness and no frontal sinus tenderness. Left sinus exhibits no maxillary sinus tenderness and no frontal sinus tenderness.  Mouth/Throat: Uvula is midline and mucous membranes are  normal. Posterior oropharyngeal edema and posterior oropharyngeal erythema present. No oropharyngeal exudate or tonsillar abscesses.  Eyes: Conjunctivae and EOM are normal.  Neck: Neck supple. No thyromegaly present.  Cardiovascular: Normal rate, regular rhythm, normal heart sounds and intact distal pulses.   No murmur heard. Pulmonary/Chest: Effort normal and breath sounds normal. No respiratory distress. She has no wheezes. She has no rales. She exhibits no tenderness.  Musculoskeletal: Normal range of motion. She exhibits no edema or tenderness.  Lymphadenopathy:    She has no cervical adenopathy.  Neurological: She is alert and oriented to person, place, and time. Coordination normal.  Skin: Skin is warm and dry. No rash noted. She is not diaphoretic.  Psychiatric: She has a normal mood and affect. Her behavior is normal.  Vitals reviewed.     Assessment & Plan:   Problem List Items Addressed This Visit    None    Visit Diagnoses    Acute bronchitis, unspecified organism    -  Primary    Relevant Medications    azithromycin (ZITHROMAX) 250 MG tablet    predniSONE (DELTASONE) 20 MG tablet    HYDROcodone-homatropine (HYCODAN) 5-1.5 MG/5ML syrup        Follow up plan: Return if symptoms worsen or fail to improve.  Counseling provided for all of the vaccine components No orders of the defined types were placed in this encounter.    Caryl Pina, MD [facility] 08/24/2015, 1:52 PM

## 2015-10-05 ENCOUNTER — Other Ambulatory Visit: Payer: Self-pay | Admitting: Pediatrics

## 2015-10-05 DIAGNOSIS — R6889 Other general symptoms and signs: Secondary | ICD-10-CM

## 2015-11-07 ENCOUNTER — Other Ambulatory Visit: Payer: Self-pay | Admitting: Nurse Practitioner

## 2015-11-07 DIAGNOSIS — R6889 Other general symptoms and signs: Secondary | ICD-10-CM

## 2015-12-10 ENCOUNTER — Other Ambulatory Visit: Payer: Self-pay | Admitting: Nurse Practitioner

## 2015-12-10 DIAGNOSIS — R6889 Other general symptoms and signs: Secondary | ICD-10-CM

## 2015-12-18 ENCOUNTER — Encounter: Payer: Self-pay | Admitting: Family Medicine

## 2015-12-18 ENCOUNTER — Ambulatory Visit (INDEPENDENT_AMBULATORY_CARE_PROVIDER_SITE_OTHER): Payer: PRIVATE HEALTH INSURANCE | Admitting: Family Medicine

## 2015-12-18 VITALS — BP 104/70 | HR 50 | Temp 97.7°F | Ht 69.0 in | Wt 261.2 lb

## 2015-12-18 DIAGNOSIS — K219 Gastro-esophageal reflux disease without esophagitis: Secondary | ICD-10-CM

## 2015-12-18 DIAGNOSIS — R1111 Vomiting without nausea: Secondary | ICD-10-CM | POA: Diagnosis not present

## 2015-12-18 MED ORDER — PANTOPRAZOLE SODIUM 40 MG PO TBEC: 40.0000 mg | DELAYED_RELEASE_TABLET | Freq: Every day | ORAL | 3 refills | Status: DC

## 2015-12-18 NOTE — Progress Notes (Signed)
   HPI  Patient presents today here with emesis.  Patient describes 2-3 weeks of emesis, often after she is breast her teeth. She does not have persistent nausea but does have occasional nausea.  She had one 24 hour episode where she had emesis repetitively, she feels this was likely a GI bug, this past several days ago.  She denies fever, abdominal pain, weight loss, bilious vomiting, change in stools, or bloody vomit.  She has never had these issues before  PMH: Smoking status noted ROS: Per HPI  Objective: BP 104/70   Pulse (!) 50   Temp 97.7 F (36.5 C) (Oral)   Ht _0  (1.753 m)   Wt 261 lb 3.2 oz (118.5 kg)   BMI 38.57 kg/m  Gen: NAD, alert, cooperative with exam HEENT: NCAT CV: RRR, good S1/S2, no murmur Resp: CTABL, no wheezes, non-labored Abd: SNTND, BS present, no guarding or organomegaly Ext: No edema, warm Neuro: Alert and oriented, No gross deficits  Assessment and plan:  # Recurrent emesis. Unclear etiology She does have severe persistent GERD symptoms, escalate H2 blocker to PPI. Basic labs, return to clinic in 2 weeks Needs a physical exam, return to clinic with any concerns or worsening symptoms      Orders Placed This Encounter  Procedures  . CMP14+EGFR  . CBC with Differential  . Lipase    Meds ordered this encounter  Medications  . pantoprazole (PROTONIX) 40 MG tablet    Sig: Take 1 tablet (40 mg total) by mouth daily.    Dispense:  30 tablet    Refill:  [place], MD [facility] 12/18/2015, 9:24 AM

## 2015-12-18 NOTE — Patient Instructions (Addendum)
Great to see you!  Stop zantac, start protonix 1 pill once daily.   Come back in 2 weeks  We will call with lab results within 1 week.

## 2015-12-19 LAB — CMP14+EGFR
ALBUMIN: 3.9 g/dL (ref 3.6–4.8)
ALT: 10 IU/L (ref 0–32)
AST: 11 IU/L (ref 0–40)
Albumin/Globulin Ratio: 1.6 (ref 1.2–2.2)
Alkaline Phosphatase: 108 IU/L (ref 39–117)
BUN / CREAT RATIO: 15 (ref 12–28)
BUN: 18 mg/dL (ref 8–27)
Bilirubin Total: 0.4 mg/dL (ref 0.0–1.2)
CALCIUM: 9.8 mg/dL (ref 8.7–10.3)
CO2: 26 mmol/L (ref 18–29)
CREATININE: 1.18 mg/dL — AB (ref 0.57–1.00)
Chloride: 102 mmol/L (ref 96–106)
GFR calc Af Amer: 58 mL/min/{1.73_m2} — ABNORMAL LOW (ref >59)
GFR, EST NON AFRICAN AMERICAN: 50 mL/min/{1.73_m2} — AB (ref >59)
GLOBULIN, TOTAL: 2.4 g/dL (ref 1.5–4.5)
Glucose: 114 mg/dL — ABNORMAL HIGH (ref 65–99)
Potassium: 4.6 mmol/L (ref 3.5–5.2)
SODIUM: 143 mmol/L (ref 134–144)
Total Protein: 6.3 g/dL (ref 6.0–8.5)

## 2015-12-19 LAB — CBC WITH DIFFERENTIAL/PLATELET
BASOS: 1 %
Basophils Absolute: 0.1 10*3/uL (ref 0.0–0.2)
EOS (ABSOLUTE): 0.2 10*3/uL (ref 0.0–0.4)
EOS: 3 %
HEMATOCRIT: 39.4 % (ref 34.0–46.6)
HEMOGLOBIN: 12.6 g/dL (ref 11.1–15.9)
IMMATURE GRANULOCYTES: 0 %
Immature Grans (Abs): 0 10*3/uL (ref 0.0–0.1)
Lymphocytes Absolute: 2 10*3/uL (ref 0.7–3.1)
Lymphs: 26 %
MCH: 30.1 pg (ref 26.6–33.0)
MCHC: 32 g/dL (ref 31.5–35.7)
MCV: 94 fL (ref 79–97)
MONOCYTES: 6 %
MONOS ABS: 0.5 10*3/uL (ref 0.1–0.9)
NEUTROS PCT: 64 %
Neutrophils Absolute: 4.9 10*3/uL (ref 1.4–7.0)
Platelets: 298 10*3/uL (ref 150–379)
RBC: 4.18 x10E6/uL (ref 3.77–5.28)
RDW: 13.8 % (ref 12.3–15.4)
WBC: 7.7 10*3/uL (ref 3.4–10.8)

## 2015-12-19 LAB — LIPASE: Lipase: 22 U/L (ref 14–72)

## 2016-01-01 ENCOUNTER — Ambulatory Visit: Payer: PRIVATE HEALTH INSURANCE | Admitting: Family Medicine

## 2016-01-07 ENCOUNTER — Other Ambulatory Visit: Payer: Self-pay | Admitting: Family Medicine

## 2016-01-07 DIAGNOSIS — R6889 Other general symptoms and signs: Secondary | ICD-10-CM

## 2016-03-09 ENCOUNTER — Other Ambulatory Visit: Payer: Self-pay | Admitting: Nurse Practitioner

## 2016-04-15 ENCOUNTER — Other Ambulatory Visit: Payer: Self-pay | Admitting: Family Medicine

## 2016-06-30 ENCOUNTER — Other Ambulatory Visit: Payer: Self-pay | Admitting: Family Medicine

## 2016-06-30 DIAGNOSIS — R6889 Other general symptoms and signs: Secondary | ICD-10-CM

## 2016-07-29 ENCOUNTER — Other Ambulatory Visit: Payer: Self-pay | Admitting: Nurse Practitioner

## 2016-08-01 ENCOUNTER — Other Ambulatory Visit: Payer: Self-pay | Admitting: Nurse Practitioner

## 2016-08-01 DIAGNOSIS — R6889 Other general symptoms and signs: Secondary | ICD-10-CM

## 2016-10-17 ENCOUNTER — Other Ambulatory Visit: Payer: Self-pay | Admitting: Nurse Practitioner

## 2016-10-20 NOTE — Telephone Encounter (Signed)
Last refill without being seen 

## 2016-10-20 NOTE — Telephone Encounter (Signed)
Patient aware that she will need to be seen for any further refills

## 2016-10-31 ENCOUNTER — Other Ambulatory Visit: Payer: Self-pay | Admitting: Nurse Practitioner

## 2016-10-31 DIAGNOSIS — R6889 Other general symptoms and signs: Secondary | ICD-10-CM

## 2016-10-31 NOTE — Telephone Encounter (Signed)
Last seen 12/18/15  Dr Paula Libra PCP

## 2016-10-31 NOTE — Telephone Encounter (Signed)
Last refill without being seen 

## 2016-11-08 ENCOUNTER — Other Ambulatory Visit: Payer: Self-pay | Admitting: Family Medicine

## 2016-11-10 NOTE — Telephone Encounter (Signed)
Last seen 12/18/15  MMM

## 2016-11-10 NOTE — Telephone Encounter (Signed)
Last refill without being seen 

## 2016-11-29 ENCOUNTER — Other Ambulatory Visit: Payer: Self-pay | Admitting: Nurse Practitioner

## 2016-11-29 DIAGNOSIS — R6889 Other general symptoms and signs: Secondary | ICD-10-CM

## 2016-12-15 ENCOUNTER — Ambulatory Visit: Payer: PRIVATE HEALTH INSURANCE | Admitting: Family Medicine

## 2016-12-19 ENCOUNTER — Encounter: Payer: Self-pay | Admitting: Family Medicine

## 2016-12-19 ENCOUNTER — Ambulatory Visit (INDEPENDENT_AMBULATORY_CARE_PROVIDER_SITE_OTHER): Payer: PRIVATE HEALTH INSURANCE | Admitting: Family Medicine

## 2016-12-19 VITALS — BP 155/83 | HR 57 | Temp 97.1°F | Ht 69.0 in | Wt 258.4 lb

## 2016-12-19 DIAGNOSIS — E785 Hyperlipidemia, unspecified: Secondary | ICD-10-CM | POA: Diagnosis not present

## 2016-12-19 DIAGNOSIS — Z0000 Encounter for general adult medical examination without abnormal findings: Secondary | ICD-10-CM

## 2016-12-19 DIAGNOSIS — K219 Gastro-esophageal reflux disease without esophagitis: Secondary | ICD-10-CM | POA: Diagnosis not present

## 2016-12-19 DIAGNOSIS — M8580 Other specified disorders of bone density and structure, unspecified site: Secondary | ICD-10-CM | POA: Diagnosis not present

## 2016-12-19 DIAGNOSIS — I10 Essential (primary) hypertension: Secondary | ICD-10-CM

## 2016-12-19 DIAGNOSIS — Z23 Encounter for immunization: Secondary | ICD-10-CM | POA: Diagnosis not present

## 2016-12-19 MED ORDER — METOPROLOL TARTRATE 50 MG PO TABS: ORAL_TABLET | ORAL | 3 refills | Status: DC

## 2016-12-19 MED ORDER — LISINOPRIL 20 MG PO TABS: ORAL_TABLET | ORAL | 3 refills | Status: DC

## 2016-12-19 MED ORDER — PANTOPRAZOLE SODIUM 40 MG PO TBEC: 40.0000 mg | DELAYED_RELEASE_TABLET | Freq: Every day | ORAL | 3 refills | Status: DC

## 2016-12-19 MED ORDER — AMLODIPINE BESYLATE 10 MG PO TABS: ORAL_TABLET | ORAL | 3 refills | Status: DC

## 2016-12-19 NOTE — Patient Instructions (Signed)
Great to see you!   

## 2016-12-19 NOTE — Progress Notes (Signed)
   HPI  Patient presents today here for follow-up of chronic medical conditions.  Patient reports no concerns recently.  She is taking all her medications regularly except for amlodipine which she has been out of for about 3 or 4 days. She denies headache or chest pain except for occasional stress related anxiety chest pain at work which resolves after calming down.  She denies any shortness of breath. We discussed her simvastatin dose, she would like to wait and will try to reduce the dose if her cholesterol is controlled.  Excise a fast Research scientist (medical).  She only had a few hours of sleep last night.  PMH: Smoking status noted ROS: Per HPI  Objective: BP (!) 155/83   Pulse (!) 57   Temp (!) 97.1 F (36.2 C) (Oral)   Ht '5\' 9"'$  (1.753 m)   Wt 258 lb 6.4 oz (117.2 kg)   BMI 38.16 kg/m  Gen: NAD, alert, cooperative with exam HEENT: NCAT CV: RRR, good S1/S2, no murmur Resp: CTABL, no wheezes, non-labored Ext: No edema, warm Neuro: Alert and oriented, No gross deficits  Assessment and plan:  #Hypertension Elevated, however patient's not taking medication as prescribed today. Continue current medications Labs  #Hyperlipidemia Need to change simvastatin to 20 mg or change to another product given that she is on amlodipine as well. We will see her labs and then decide.  My desires to go down to 20 mg of possible  #GERD Well controlled with PPI, occasional Zantac use as well  #Osteopenia Vitamin D checked  Healthcare maintenance Hep C check, mammogram ordered Influenza vaccine given-counseling provided for all vaccine components    Orders Placed This Encounter  Procedures  . CMP14+EGFR  . CBC with Differential/Platelet  . Lipid panel  . TSH  . VITAMIN D 25 Hydroxy (Vit-D Deficiency, Fractures)  . Hepatitis C antibody    Meds ordered this encounter  Medications  . amLODipine (NORVASC) 10 MG tablet    Sig: TAKE 1 TABLET BY MOUTH EVERY DAY    Dispense:   90 tablet    Refill:  3  . lisinopril (PRINIVIL,ZESTRIL) 20 MG tablet    Sig: TAKE 1 TABLET (20 MG TOTAL) BY MOUTH DAILY.    Dispense:  90 tablet    Refill:  3  . metoprolol tartrate (LOPRESSOR) 50 MG tablet    Sig: TAKE 1 TABLET (50 MG TOTAL) BY MOUTH 2 (TWO) TIMES DAILY.    Dispense:  180 tablet    Refill:  3  . pantoprazole (PROTONIX) 40 MG tablet    Sig: Take 1 tablet (40 mg total) by mouth daily.    Dispense:  90 tablet    Refill:  [place], MD [facility] 12/19/2016, 9:00 AM

## 2016-12-20 LAB — CMP14+EGFR
ALBUMIN: 3.9 g/dL (ref 3.6–4.8)
ALT: 10 IU/L (ref 0–32)
AST: 13 IU/L (ref 0–40)
Albumin/Globulin Ratio: 1.7 (ref 1.2–2.2)
Alkaline Phosphatase: 102 IU/L (ref 39–117)
BILIRUBIN TOTAL: 0.4 mg/dL (ref 0.0–1.2)
BUN / CREAT RATIO: 14 (ref 12–28)
BUN: 23 mg/dL (ref 8–27)
CHLORIDE: 105 mmol/L (ref 96–106)
CO2: 25 mmol/L (ref 20–29)
CREATININE: 1.64 mg/dL — AB (ref 0.57–1.00)
Calcium: 9 mg/dL (ref 8.7–10.3)
GFR calc non Af Amer: 33 mL/min/{1.73_m2} — ABNORMAL LOW (ref >59)
GFR, EST AFRICAN AMERICAN: 38 mL/min/{1.73_m2} — AB (ref >59)
GLUCOSE: 102 mg/dL — AB (ref 65–99)
Globulin, Total: 2.3 g/dL (ref 1.5–4.5)
Potassium: 4.9 mmol/L (ref 3.5–5.2)
Sodium: 144 mmol/L (ref 134–144)
TOTAL PROTEIN: 6.2 g/dL (ref 6.0–8.5)

## 2016-12-20 LAB — CBC WITH DIFFERENTIAL/PLATELET
Basophils Absolute: 0 x10E3/uL (ref 0.0–0.2)
Basos: 1 %
EOS (ABSOLUTE): 0.3 x10E3/uL (ref 0.0–0.4)
Eos: 4 %
Hematocrit: 36.3 % (ref 34.0–46.6)
Hemoglobin: 11.8 g/dL (ref 11.1–15.9)
Immature Grans (Abs): 0 x10E3/uL (ref 0.0–0.1)
Immature Granulocytes: 0 %
Lymphocytes Absolute: 2 x10E3/uL (ref 0.7–3.1)
Lymphs: 29 %
MCH: 30.1 pg (ref 26.6–33.0)
MCHC: 32.5 g/dL (ref 31.5–35.7)
MCV: 93 fL (ref 79–97)
Monocytes Absolute: 0.5 x10E3/uL (ref 0.1–0.9)
Monocytes: 7 %
Neutrophils Absolute: 4.1 x10E3/uL (ref 1.4–7.0)
Neutrophils: 59 %
Platelets: 280 x10E3/uL (ref 150–379)
RBC: 3.92 x10E6/uL (ref 3.77–5.28)
RDW: 13.3 % (ref 12.3–15.4)
WBC: 6.8 x10E3/uL (ref 3.4–10.8)

## 2016-12-20 LAB — LIPID PANEL
Chol/HDL Ratio: 5.8 ratio — ABNORMAL HIGH (ref 0.0–4.4)
Cholesterol, Total: 180 mg/dL (ref 100–199)
HDL: 31 mg/dL — ABNORMAL LOW (ref >39)
LDL Calculated: 120 mg/dL — ABNORMAL HIGH (ref 0–99)
Triglycerides: 145 mg/dL (ref 0–149)
VLDL Cholesterol Cal: 29 mg/dL (ref 5–40)

## 2016-12-20 LAB — HEPATITIS C ANTIBODY: Hep C Virus Ab: <0.1 s/co ratio (ref 0.0–0.9)

## 2016-12-20 LAB — TSH: TSH: 2.13 u[IU]/mL (ref 0.450–4.500)

## 2016-12-20 LAB — VITAMIN D 25 HYDROXY (VIT D DEFICIENCY, FRACTURES): VIT D 25 HYDROXY: 27.3 ng/mL — AB (ref 30.0–100.0)

## 2016-12-23 ENCOUNTER — Other Ambulatory Visit: Payer: Self-pay | Admitting: Family Medicine

## 2016-12-23 ENCOUNTER — Other Ambulatory Visit: Payer: Self-pay

## 2016-12-23 DIAGNOSIS — N289 Disorder of kidney and ureter, unspecified: Secondary | ICD-10-CM

## 2016-12-23 MED ORDER — PRAVASTATIN SODIUM 40 MG PO TABS: 40.0000 mg | ORAL_TABLET | Freq: Every day | ORAL | 3 refills | Status: DC

## 2017-01-08 ENCOUNTER — Ambulatory Visit (HOSPITAL_COMMUNITY): Payer: Worker's Compensation

## 2017-03-18 ENCOUNTER — Other Ambulatory Visit: Payer: Self-pay | Admitting: Family Medicine

## 2017-03-18 ENCOUNTER — Ambulatory Visit: Payer: PRIVATE HEALTH INSURANCE | Admitting: Family Medicine

## 2017-03-18 ENCOUNTER — Encounter: Payer: Self-pay | Admitting: Family Medicine

## 2017-03-18 ENCOUNTER — Ambulatory Visit (HOSPITAL_COMMUNITY)
Admission: RE | Admit: 2017-03-18 | Discharge: 2017-03-18 | Disposition: A | Discharge status: Home / Self Care | Payer: 59 | Source: Ambulatory Visit | Attending: Family Medicine | Admitting: Family Medicine

## 2017-03-18 VITALS — BP 140/72 | HR 57 | Temp 97.2°F | Ht 69.0 in | Wt 265.0 lb

## 2017-03-18 DIAGNOSIS — R011 Cardiac murmur, unspecified: Secondary | ICD-10-CM | POA: Diagnosis not present

## 2017-03-18 DIAGNOSIS — I8001 Phlebitis and thrombophlebitis of superficial vessels of right lower extremity: Secondary | ICD-10-CM | POA: Insufficient documentation

## 2017-03-18 DIAGNOSIS — M79661 Pain in right lower leg: Secondary | ICD-10-CM | POA: Diagnosis present

## 2017-03-18 MED ORDER — DICLOFENAC SODIUM 75 MG PO TBEC: 75.0000 mg | DELAYED_RELEASE_TABLET | Freq: Two times a day (BID) | ORAL | 0 refills | Status: DC

## 2017-03-18 NOTE — Patient Instructions (Signed)
Go to any pain hospital as directed for ultrasound.  I will contact you with results of this and we will plan medication around this study.

## 2017-03-18 NOTE — Progress Notes (Signed)
Patient seen earlier today for right lower extremity pain and swelling.  She had a venous ultrasound performed at [facility] which showed thrombophlebitis of the short saphenous vein.  No involvement of the deep venous system.  Findings reviewed with the patient, who voiced good understanding of treatment plan.  We will treat with oral NSAIDs.  Diclofenac sent to pharmacy.  Patient to follow-up with PCP in 2 weeks to evaluate for resolution.  Lori Ortiz M. Lajuana Ripple, [facility]  Meds ordered this encounter  Medications  . diclofenac (VOLTAREN) 75 MG EC tablet    Sig: Take 1 tablet (75 mg total) by mouth 2 (two) times daily.    Dispense:  30 tablet    Refill:  0

## 2017-03-18 NOTE — Progress Notes (Addendum)
Subjective: CC: Knot on right leg PCP: Chevis Pretty, FNP Lori Ortiz is a 62 y.o. female presenting to clinic today for:  1. Knot on leg Patient reports that she noticed a red, hot, painful knot in the posterior of the right calf on Thursday.  Denies any preceding injury.  She did travel to [place], which she drove but notes that lesion was there prior to travel.  She denies chest pain, shortness of breath, dyspnea on exertion, fevers, chills.  She has a sister who is a nurse was concerned for DVT.  Denies personal history or family history of DVT, PE or coagulopathy.  She is a daily smoker.   ROS: Per HPI  No Known Allergies Past Medical History:  Diagnosis Date  . GERD (gastroesophageal reflux disease)   . Hypertension   . Stroke [facility])     Current Outpatient Medications:  .  Acetaminophen-DM (TYLENOL COUGH/SORE THROAT PO), Take by mouth., Disp: , Rfl:  .  amLODipine (NORVASC) 10 MG tablet, TAKE 1 TABLET BY MOUTH EVERY DAY, Disp: 90 tablet, Rfl: 3 .  lisinopril (PRINIVIL,ZESTRIL) 20 MG tablet, TAKE 1 TABLET (20 MG TOTAL) BY MOUTH DAILY., Disp: 90 tablet, Rfl: 3 .  metoprolol tartrate (LOPRESSOR) 50 MG tablet, TAKE 1 TABLET (50 MG TOTAL) BY MOUTH 2 (TWO) TIMES DAILY., Disp: 180 tablet, Rfl: 3 .  pantoprazole (PROTONIX) 40 MG tablet, Take 1 tablet (40 mg total) by mouth daily., Disp: 90 tablet, Rfl: 3 .  pravastatin (PRAVACHOL) 40 MG tablet, Take 1 tablet (40 mg total) by mouth daily., Disp: 90 tablet, Rfl: 3 Social History   Socioeconomic History  . Marital status: Single    Spouse name: Not on file  . Number of children: Not on file  . Years of education: Not on file  . Highest education level: Not on file  Social Needs  . Financial resource strain: Not on file  . Food insecurity - worry: Not on file  . Food insecurity - inability: Not on file  . Transportation needs - medical: Not on file  . Transportation needs - non-medical: Not on file  Occupational  History  . Not on file  Tobacco Use  . Smoking status: Current Every Day Smoker    Packs/day: 0.50  . Smokeless tobacco: Never Used  Substance and Sexual Activity  . Alcohol use: No  . Drug use: No  . Sexual activity: Not on file  Other Topics Concern  . Not on file  Social History Narrative  . Not on file   Family History  Problem Relation Age of Onset  . Heart disease Mother   . Diabetes Mother   . Cancer Mother   . Aneurysm Father   . Hypertension Father   . Hypertension Brother     Objective: Office vital signs reviewed. BP 140/72   Pulse (!) 57   Temp (!) 97.2 F (36.2 C) (Oral)   Ht 5\' 9"  (1.753 m)   Wt 265 lb (120.2 kg)   BMI 39.13 kg/m   Physical Examination:  General: Awake, alert, well nourished, No acute distress HEENT: sclera white, MMM Cardio: bradycardic w/ regular rhythm, S1S2 heard, 2/6 SEM appreciated at the RSB Pulm: clear to auscultation bilaterally, no wheezes, rhonchi or rales; normal work of breathing on room air Ext: Warm, well-perfused  Right lower extremity: Approximately 3 inch area of induration along the medial lateral aspect of the posterior right calf.  This is erythematous and warm to touch.  No palpable cords.  47.5 cm in girth  Left lower extremity: Unremarkable.  No edema.  47.75 cm in girth Assessment/ Plan: 62 y.o. female   1. Right calf pain Concerning for DVT versus cellulitis.  She demonstrates no signs or symptoms of pulmonary embolism.  Wells score 1 for DVT.  Venous ultrasound of the right lower extremity ordered stat.  We will plan to initiate Xarelto if positive.  If negative, will initiate oral antibiotics to cover for cellulitis and have patient be rechecked in 2-3 days.  Instructions to radiology reviewed with the patient.  She voiced good understanding.  Will contact patient as soon as the result is available. - US Venous Img Lower Unilateral Right; Future  2. Heart murmur Appreciated the right sternal border.   Patient notes that this is been recognized before.  Unclear if so whether or not she has had a workup/echocardiogram.  Would consider pursuing this if it has not been already done.    Orders Placed This Encounter  Procedures  . US Venous Img Lower Unilateral Right    Patient to wait until report called    Standing Status:   Future    Standing Expiration Date:   05/17/2018    Order Specific Question:   Reason for Exam (SYMPTOM  OR DIAGNOSIS REQUIRED)    Answer:   RLE pain, swelling concerning for DVT    Order Specific Question:   Preferred imaging location?    Answer:   Internal    Order Specific Question:   Call Results- Best Contact Number?    Answer:   [place], [place] [phone]

## 2017-04-03 ENCOUNTER — Other Ambulatory Visit: Payer: Self-pay | Admitting: Family Medicine

## 2017-07-28 ENCOUNTER — Ambulatory Visit: Payer: PRIVATE HEALTH INSURANCE | Admitting: Physician Assistant

## 2017-07-28 ENCOUNTER — Encounter: Payer: Self-pay | Admitting: Physician Assistant

## 2017-07-28 VITALS — BP 132/79 | HR 60 | Temp 99.0°F | Ht 69.0 in | Wt 263.0 lb

## 2017-07-28 DIAGNOSIS — L03213 Periorbital cellulitis: Secondary | ICD-10-CM

## 2017-07-28 MED ORDER — AMOXICILLIN-POT CLAVULANATE 875-125 MG PO TABS: 1.0000 | ORAL_TABLET | Freq: Two times a day (BID) | ORAL | 0 refills | Status: DC

## 2017-07-28 NOTE — Patient Instructions (Signed)

## 2017-07-28 NOTE — Progress Notes (Signed)
  Subjective:     Patient ID: Lori Ortiz, female   DOB: 31-Dec-1955, 62 y.o.   MRN: [id number]  HPI Pt with progressive redness and swelling surrounding the R eye Denies any pain to the area but having sl pruritus This started to the medial part of the eye and now has progressed all of the way around the eye Denies any visual change No known injury or bite to the area No meds for sx Has used cool compresses  Review of Systems  Constitutional: Negative.   HENT: Negative.   Eyes: Positive for discharge. Negative for photophobia, pain, redness, itching and visual disturbance.  Skin: Positive for color change and rash.       Objective:   Physical Exam + erythema, warmth, and edema surrounding the R eye that extends to the upper cheek and temporal area No definite bite seen PERRLA EOMI No real lid edema Clear watery d/c form the eye No preauric nodes No cerv nodes    Assessment:     1. Periorbital cellulitis of right eye        Plan:     Cool compresses Augmentin rx OTC antihistamine F/U in 2 days

## 2017-11-03 ENCOUNTER — Other Ambulatory Visit: Payer: Self-pay | Admitting: *Deleted

## 2017-11-03 DIAGNOSIS — K219 Gastro-esophageal reflux disease without esophagitis: Secondary | ICD-10-CM

## 2017-11-03 DIAGNOSIS — I10 Essential (primary) hypertension: Secondary | ICD-10-CM

## 2017-11-03 MED ORDER — PANTOPRAZOLE SODIUM 40 MG PO TBEC: 40.0000 mg | DELAYED_RELEASE_TABLET | Freq: Every day | ORAL | 0 refills | Status: DC

## 2017-11-03 MED ORDER — AMLODIPINE BESYLATE 10 MG PO TABS: ORAL_TABLET | ORAL | 0 refills | Status: DC

## 2018-01-04 ENCOUNTER — Other Ambulatory Visit: Payer: Self-pay | Admitting: Nurse Practitioner

## 2018-01-04 DIAGNOSIS — I10 Essential (primary) hypertension: Secondary | ICD-10-CM

## 2018-01-04 DIAGNOSIS — K219 Gastro-esophageal reflux disease without esophagitis: Secondary | ICD-10-CM

## 2018-01-05 NOTE — Telephone Encounter (Signed)
MMM NTBS 30 day given 11/03/17

## 2018-01-06 ENCOUNTER — Other Ambulatory Visit: Payer: Self-pay | Admitting: Nurse Practitioner

## 2018-01-06 DIAGNOSIS — I10 Essential (primary) hypertension: Secondary | ICD-10-CM

## 2018-01-06 NOTE — Telephone Encounter (Signed)
Aware, needs an appointment.

## 2018-01-06 NOTE — Telephone Encounter (Signed)
MMM. NTBS 30 days given 11/03/17

## 2018-01-06 NOTE — Telephone Encounter (Signed)
Aware, needs to schedule an appointment.

## 2018-01-18 ENCOUNTER — Other Ambulatory Visit: Payer: Self-pay | Admitting: Nurse Practitioner

## 2018-01-18 ENCOUNTER — Encounter: Payer: Self-pay | Admitting: Family Medicine

## 2018-01-18 ENCOUNTER — Ambulatory Visit: Payer: PRIVATE HEALTH INSURANCE | Admitting: Family Medicine

## 2018-01-18 VITALS — BP 170/89 | HR 47 | Temp 97.3°F | Ht 69.0 in | Wt 259.0 lb

## 2018-01-18 DIAGNOSIS — I10 Essential (primary) hypertension: Secondary | ICD-10-CM

## 2018-01-18 DIAGNOSIS — R05 Cough: Secondary | ICD-10-CM | POA: Diagnosis not present

## 2018-01-18 DIAGNOSIS — K219 Gastro-esophageal reflux disease without esophagitis: Secondary | ICD-10-CM

## 2018-01-18 DIAGNOSIS — R059 Cough, unspecified: Secondary | ICD-10-CM

## 2018-01-18 MED ORDER — HYDROCODONE-HOMATROPINE 5-1.5 MG/5ML PO SYRP: 5.0000 mL | ORAL_SOLUTION | Freq: Four times a day (QID) | ORAL | 0 refills | Status: AC | PRN

## 2018-01-18 MED ORDER — AMOXICILLIN-POT CLAVULANATE 875-125 MG PO TABS: 1.0000 | ORAL_TABLET | Freq: Two times a day (BID) | ORAL | 0 refills | Status: DC

## 2018-01-18 NOTE — Progress Notes (Signed)
Chief Complaint  Patient presents with  . URI    HPI  Patient presents today for Patient presents with upper respiratory congestion. Rhinorrhea that is frequently purulent. There is moderate sore throat. Patient reports coughing frequently as well.  yellow sputum noted. There is no fever, chills or sweats. The patient denies being short of breath. Onset was 3-5 days ago. Gradually worsening. Tried OTCs without improvement.  PMH: Smoking status noted ROS: Per HPI  Objective: BP (!) 170/89   Pulse (!) 47   Temp (!) 97.3 F (36.3 C)   Ht 5\' 9"  (1.753 m)   Wt 259 lb (117.5 kg)   BMI 38.25 kg/m  Gen: NAD, alert, cooperative with exam HEENT: NCAT, Nasal passages swollen, red TMS RED CV: RRR, good S1/S2, no murmur Resp: Bronchitis changes with scattered wheezes, non-labored Ext: No edema, warm Neuro: Alert and oriented, No gross deficits  Assessment and plan:  1. Cough     Meds ordered this encounter  Medications  . amoxicillin-clavulanate (AUGMENTIN) 875-125 MG tablet    Sig: Take 1 tablet by mouth 2 (two) times daily. Take all of this medication    Dispense:  20 tablet    Refill:  0  . HYDROcodone-homatropine (HYCODAN) 5-1.5 MG/5ML syrup    Sig: Take 5 mLs by mouth every 6 (six) hours as needed for up to 5 days for cough.    Dispense:  100 mL    Refill:  0    No orders of the defined types were placed in this encounter.   Follow up as needed.  Claretta Fraise, MD

## 2018-01-20 ENCOUNTER — Other Ambulatory Visit: Payer: Self-pay | Admitting: *Deleted

## 2018-01-20 DIAGNOSIS — I10 Essential (primary) hypertension: Secondary | ICD-10-CM

## 2018-01-20 MED ORDER — METOPROLOL TARTRATE 50 MG PO TABS: ORAL_TABLET | ORAL | 0 refills | Status: DC

## 2018-02-05 ENCOUNTER — Other Ambulatory Visit: Payer: Self-pay | Admitting: *Deleted

## 2018-02-05 DIAGNOSIS — I10 Essential (primary) hypertension: Secondary | ICD-10-CM

## 2018-02-05 MED ORDER — LISINOPRIL 20 MG PO TABS: 20.0000 mg | ORAL_TABLET | Freq: Every day | ORAL | 0 refills | Status: DC

## 2018-02-24 ENCOUNTER — Other Ambulatory Visit: Payer: Self-pay | Admitting: Nurse Practitioner

## 2018-02-24 DIAGNOSIS — K219 Gastro-esophageal reflux disease without esophagitis: Secondary | ICD-10-CM

## 2018-02-24 DIAGNOSIS — I10 Essential (primary) hypertension: Secondary | ICD-10-CM

## 2018-03-01 ENCOUNTER — Other Ambulatory Visit: Payer: Self-pay | Admitting: Nurse Practitioner

## 2018-03-01 DIAGNOSIS — I10 Essential (primary) hypertension: Secondary | ICD-10-CM

## 2018-03-01 NOTE — Telephone Encounter (Signed)
MMM. NTBS 30 days given 01/19/18

## 2018-03-01 NOTE — Telephone Encounter (Signed)
appt made

## 2018-03-05 ENCOUNTER — Ambulatory Visit (INDEPENDENT_AMBULATORY_CARE_PROVIDER_SITE_OTHER): Payer: PRIVATE HEALTH INSURANCE

## 2018-03-05 ENCOUNTER — Ambulatory Visit: Payer: PRIVATE HEALTH INSURANCE | Admitting: Nurse Practitioner

## 2018-03-05 ENCOUNTER — Encounter: Payer: Self-pay | Admitting: Nurse Practitioner

## 2018-03-05 VITALS — BP 165/92 | HR 49 | Temp 96.9°F | Ht 69.0 in | Wt 261.0 lb

## 2018-03-05 DIAGNOSIS — K219 Gastro-esophageal reflux disease without esophagitis: Secondary | ICD-10-CM

## 2018-03-05 DIAGNOSIS — Z0001 Encounter for general adult medical examination with abnormal findings: Secondary | ICD-10-CM

## 2018-03-05 DIAGNOSIS — I10 Essential (primary) hypertension: Secondary | ICD-10-CM

## 2018-03-05 DIAGNOSIS — Z0000 Encounter for general adult medical examination without abnormal findings: Secondary | ICD-10-CM

## 2018-03-05 DIAGNOSIS — E782 Mixed hyperlipidemia: Secondary | ICD-10-CM | POA: Diagnosis not present

## 2018-03-05 DIAGNOSIS — R001 Bradycardia, unspecified: Secondary | ICD-10-CM | POA: Diagnosis not present

## 2018-03-05 DIAGNOSIS — M8580 Other specified disorders of bone density and structure, unspecified site: Secondary | ICD-10-CM

## 2018-03-05 DIAGNOSIS — M8588 Other specified disorders of bone density and structure, other site: Secondary | ICD-10-CM | POA: Diagnosis not present

## 2018-03-05 LAB — CMP14+EGFR
ALT: 10 IU/L (ref 0–32)
AST: 10 IU/L (ref 0–40)
Albumin/Globulin Ratio: 1.7 (ref 1.2–2.2)
Albumin: 4 g/dL (ref 3.6–4.8)
Alkaline Phosphatase: 117 IU/L (ref 39–117)
BUN/Creatinine Ratio: 16 (ref 12–28)
BUN: 33 mg/dL — ABNORMAL HIGH (ref 8–27)
Bilirubin Total: 0.2 mg/dL (ref 0.0–1.2)
CO2: 23 mmol/L (ref 20–29)
Calcium: 9.8 mg/dL (ref 8.7–10.3)
Chloride: 105 mmol/L (ref 96–106)
Creatinine, Ser: 2.08 mg/dL — ABNORMAL HIGH (ref 0.57–1.00)
GFR calc Af Amer: 29 mL/min/{1.73_m2} — ABNORMAL LOW (ref >59)
GFR calc non Af Amer: 25 mL/min/{1.73_m2} — ABNORMAL LOW (ref >59)
Globulin, Total: 2.3 g/dL (ref 1.5–4.5)
Glucose: 99 mg/dL (ref 65–99)
Potassium: 5.8 mmol/L — ABNORMAL HIGH (ref 3.5–5.2)
Sodium: 141 mmol/L (ref 134–144)
Total Protein: 6.3 g/dL (ref 6.0–8.5)

## 2018-03-05 LAB — LIPID PANEL
Chol/HDL Ratio: 5.8 ratio — ABNORMAL HIGH (ref 0.0–4.4)
Cholesterol, Total: 186 mg/dL (ref 100–199)
HDL: 32 mg/dL — ABNORMAL LOW (ref >39)
LDL CALC: 127 mg/dL — AB (ref 0–99)
Triglycerides: 136 mg/dL (ref 0–149)
VLDL Cholesterol Cal: 27 mg/dL (ref 5–40)

## 2018-03-05 MED ORDER — LISINOPRIL 40 MG PO TABS: 40.0000 mg | ORAL_TABLET | Freq: Every day | ORAL | 1 refills | Status: DC

## 2018-03-05 MED ORDER — AMLODIPINE BESYLATE 10 MG PO TABS: 10.0000 mg | ORAL_TABLET | Freq: Every day | ORAL | 1 refills | Status: DC

## 2018-03-05 MED ORDER — PRAVASTATIN SODIUM 40 MG PO TABS: 40.0000 mg | ORAL_TABLET | Freq: Every day | ORAL | 3 refills | Status: DC

## 2018-03-05 MED ORDER — PANTOPRAZOLE SODIUM 40 MG PO TBEC: 40.0000 mg | DELAYED_RELEASE_TABLET | Freq: Every day | ORAL | 1 refills | Status: DC

## 2018-03-05 NOTE — Progress Notes (Signed)
Subjective:    Patient ID: Lori Ortiz, female    DOB: 03-08-1955, 63 y.o.   MRN: [id number]  .  Chief Complaint: annual physical  HPI:  1. Essential hypertension  No c/o chset pain, sob or headache. Does not check blood pressure at home. BP Readings from Last 3 Encounters:  03/05/18 (!) 165/92  01/18/18 (!) 170/89  07/28/17 132/79     2. Gastroesophageal reflux disease, esophagitis presence not specified  is on daily protonix. Works well to keep symptoms under control.  3. Mixed hyperlipidemia  Does not watch diet very closely and does very little exercise.  4. Osteopenia, unspecified location  No record of last dexascan but had to have had at some point in time to be diagnosed with osteopenia..     Outpatient Encounter Medications as of 03/05/2018  Medication Sig  . Acetaminophen-DM (TYLENOL COUGH/SORE THROAT PO) Take by mouth.  Marland Kitchen amLODipine (NORVASC) 10 MG tablet TAKE 1 TABLET BY MOUTH EVERY DAY (NEEDS TO BE SEEN)  . diclofenac (VOLTAREN) 75 MG EC tablet TAKE 1 TABLET BY MOUTH TWICE A DAY  . lisinopril (PRINIVIL,ZESTRIL) 20 MG tablet Take 1 tablet (20 mg total) by mouth daily. (Needs to be seen before next refill)  . metoprolol tartrate (LOPRESSOR) 50 MG tablet TAKE 1 TABLET (50 MG TOTAL) BY MOUTH 2 (TWO) TIMES DAILY. (NEEDS TO BE SEEN BEFORE NEXT REFILL)  . pantoprazole (PROTONIX) 40 MG tablet TAKE 1 TABLET (40 MG TOTAL) BY MOUTH DAILY. (NEEDS TO BE SEEN)  . pravastatin (PRAVACHOL) 40 MG tablet Take 1 tablet (40 mg total) by mouth daily.       New complaints: None today  Social history: Still working at [employer] in [place]  Constitutional: Negative for activity change and appetite change.  HENT: Negative.   Eyes: Negative for pain.  Respiratory: Negative for shortness of breath.   Cardiovascular: Negative for chest pain, palpitations and leg swelling.  Gastrointestinal: Negative for abdominal pain.  Endocrine: Negative for polydipsia.    Genitourinary: Negative.   Skin: Negative for rash.  Neurological: Negative for dizziness, weakness and headaches.  Hematological: Does not bruise/bleed easily.  Psychiatric/Behavioral: Negative.   All other systems reviewed and are negative.      Objective:   Physical Exam Vitals signs and nursing note reviewed.  Constitutional:      General: She is not in acute distress.    Appearance: Normal appearance. She is well-developed.  HENT:     Head: Normocephalic.     Nose: Nose normal.  Eyes:     Pupils: Pupils are equal, round, and reactive to light.  Neck:     Musculoskeletal: Normal range of motion and neck supple.     Vascular: No carotid bruit or JVD.  Cardiovascular:     Rate and Rhythm: Normal rate and regular rhythm.     Heart sounds: Normal heart sounds.  Pulmonary:     Effort: Pulmonary effort is normal. No respiratory distress.     Breath sounds: Normal breath sounds. No wheezing or rales.  Chest:     Chest wall: No tenderness.  Abdominal:     General: Bowel sounds are normal. There is no distension or abdominal bruit.     Palpations: Abdomen is soft. There is no hepatomegaly, splenomegaly, mass or pulsatile mass.     Tenderness: There is no abdominal tenderness.  Musculoskeletal: Normal range of motion.     Right lower leg: Edema (1+) present.  Left lower leg: Edema (1+) present.  Lymphadenopathy:     Cervical: No cervical adenopathy.  Skin:    General: Skin is warm and dry.  Neurological:     Mental Status: She is alert and oriented to person, place, and time.     Deep Tendon Reflexes: Reflexes are normal and symmetric.  Psychiatric:        Behavior: Behavior normal.        Thought Content: Thought content normal.        Judgment: Judgment normal.    BP (!) 165/92   Pulse (!) 49   Temp (!) 96.9 F (36.1 C) (Oral)   Ht '5\' 9"'  (1.753 m)   Wt 261 lb (118.4 kg)   BMI 38.54 kg/m   EKG- sinus bradycardia with first degree block-Mary-Margaret Hassell Done,  FNP Chest xray- unchanged form previous       Assessment & Plan:  Lori Ortiz comes in today with chief complaint of Medical Management of Chronic Issues (congestion)   Diagnosis and orders addressed:  1. Annual physical exam  2. Essential hypertension Low sodium diet - CMP14+EGFR - DG Chest 2 View; Future - EKG 12-Lead - lisinopril (PRINIVIL,ZESTRIL) 40 MG tablet; Take 1 tablet (40 mg total) by mouth daily.  Dispense: 90 tablet; Refill: 1 - amLODipine (NORVASC) 10 MG tablet; Take 1 tablet (10 mg total) by mouth daily.  Dispense: 90 tablet; Refill: 1  3. Gastroesophageal reflux disease, esophagitis presence not specified Avoid spicy foods Do not eat 2 hours prior to bedtime - pantoprazole (PROTONIX) 40 MG tablet; Take 1 tablet (40 mg total) by mouth daily. (Needs to be seen)  Dispense: 90 tablet; Refill: 1  4. Mixed hyperlipidemia Low fat diet - Lipid panel - pravastatin (PRAVACHOL) 40 MG tablet; Take 1 tablet (40 mg total) by mouth daily.  Dispense: 90 tablet; Refill: 3  5. Osteopenia, unspecified location Weight bearing exercises - DG [facility] DEXA  6. Sinus bradycardia - ref cardiology  Labs pending Health Maintenance reviewed Diet and exercise encouraged  Follow up plan: 6 months   [place], FNP

## 2018-03-05 NOTE — Patient Instructions (Signed)
DASH Eating Plan  DASH stands for "Dietary Approaches to Stop Hypertension." The DASH eating plan is a healthy eating plan that has been shown to reduce high blood pressure (hypertension). It may also reduce your risk for type 2 diabetes, heart disease, and stroke. The DASH eating plan may also help with weight loss.  What are tips for following this plan?    General guidelines   Avoid eating more than 2,300 mg (milligrams) of salt (sodium) a day. If you have hypertension, you may need to reduce your sodium intake to 1,500 mg a day.   Limit alcohol intake to no more than 1 drink a day for nonpregnant women and 2 drinks a day for men. One drink equals 12 oz of beer, 5 oz of wine, or 1 oz of hard liquor.   Work with your health care provider to maintain a healthy body weight or to lose weight. Ask what an ideal weight is for you.   Get at least 30 minutes of exercise that causes your heart to beat faster (aerobic exercise) most days of the week. Activities may include walking, swimming, or biking.   Work with your health care provider or diet and nutrition specialist (dietitian) to adjust your eating plan to your individual calorie needs.  Reading food labels     Check food labels for the amount of sodium per serving. Choose foods with less than 5 percent of the Daily Value of sodium. Generally, foods with less than 300 mg of sodium per serving fit into this eating plan.   To find whole grains, look for the word "whole" as the first word in the ingredient list.  Shopping   Buy products labeled as "low-sodium" or "no salt added."   Buy fresh foods. Avoid canned foods and premade or frozen meals.  Cooking   Avoid adding salt when cooking. Use salt-free seasonings or herbs instead of table salt or sea salt. Check with your health care provider or pharmacist before using salt substitutes.   Do not fry foods. Cook foods using healthy methods such as baking, boiling, grilling, and broiling instead.   Cook with  heart-healthy oils, such as olive, canola, soybean, or sunflower oil.  Meal planning   Eat a balanced diet that includes:  ? 5 or more servings of fruits and vegetables each day. At each meal, try to fill half of your plate with fruits and vegetables.  ? Up to 6-8 servings of whole grains each day.  ? Less than 6 oz of lean meat, poultry, or fish each day. A 3-oz serving of meat is about the same size as a deck of cards. One egg equals 1 oz.  ? 2 servings of low-fat dairy each day.  ? A serving of nuts, seeds, or beans 5 times each week.  ? Heart-healthy fats. Healthy fats called Omega-3 fatty acids are found in foods such as flaxseeds and coldwater fish, like sardines, salmon, and mackerel.   Limit how much you eat of the following:  ? Canned or prepackaged foods.  ? Food that is high in trans fat, such as fried foods.  ? Food that is high in saturated fat, such as fatty meat.  ? Sweets, desserts, sugary drinks, and other foods with added sugar.  ? Full-fat dairy products.   Do not salt foods before eating.   Try to eat at least 2 vegetarian meals each week.   Eat more home-cooked food and less restaurant, buffet, and fast food.     When eating at a restaurant, ask that your food be prepared with less salt or no salt, if possible.  What foods are recommended?  The items listed may not be a complete list. Talk with your dietitian about what dietary choices are best for you.  Grains  Whole-grain or whole-wheat bread. Whole-grain or whole-wheat pasta. Brown rice. Oatmeal. Quinoa. Bulgur. Whole-grain and low-sodium cereals. Pita bread. Low-fat, low-sodium crackers. Whole-wheat flour tortillas.  Vegetables  Fresh or frozen vegetables (raw, steamed, roasted, or grilled). Low-sodium or reduced-sodium tomato and vegetable juice. Low-sodium or reduced-sodium tomato sauce and tomato paste. Low-sodium or reduced-sodium canned vegetables.  Fruits  All fresh, dried, or frozen fruit. Canned fruit in natural juice (without  added sugar).  Meat and other protein foods  Skinless chicken or turkey. Ground chicken or turkey. Pork with fat trimmed off. Fish and seafood. Egg whites. Dried beans, peas, or lentils. Unsalted nuts, nut butters, and seeds. Unsalted canned beans. Lean cuts of beef with fat trimmed off. Low-sodium, lean deli meat.  Dairy  Low-fat (1%) or fat-free (skim) milk. Fat-free, low-fat, or reduced-fat cheeses. Nonfat, low-sodium ricotta or cottage cheese. Low-fat or nonfat yogurt. Low-fat, low-sodium cheese.  Fats and oils  Soft margarine without trans fats. Vegetable oil. Low-fat, reduced-fat, or light mayonnaise and salad dressings (reduced-sodium). Canola, safflower, olive, soybean, and sunflower oils. Avocado.  Seasoning and other foods  Herbs. Spices. Seasoning mixes without salt. Unsalted popcorn and pretzels. Fat-free sweets.  What foods are not recommended?  The items listed may not be a complete list. Talk with your dietitian about what dietary choices are best for you.  Grains  Baked goods made with fat, such as croissants, muffins, or some breads. Dry pasta or rice meal packs.  Vegetables  Creamed or fried vegetables. Vegetables in a cheese sauce. Regular canned vegetables (not low-sodium or reduced-sodium). Regular canned tomato sauce and paste (not low-sodium or reduced-sodium). Regular tomato and vegetable juice (not low-sodium or reduced-sodium). Pickles. Olives.  Fruits  Canned fruit in a light or heavy syrup. Fried fruit. Fruit in cream or butter sauce.  Meat and other protein foods  Fatty cuts of meat. Ribs. Fried meat. Bacon. Sausage. Bologna and other processed lunch meats. Salami. Fatback. Hotdogs. Bratwurst. Salted nuts and seeds. Canned beans with added salt. Canned or smoked fish. Whole eggs or egg yolks. Chicken or turkey with skin.  Dairy  Whole or 2% milk, cream, and half-and-half. Whole or full-fat cream cheese. Whole-fat or sweetened yogurt. Full-fat cheese. Nondairy creamers. Whipped toppings.  Processed cheese and cheese spreads.  Fats and oils  Butter. Stick margarine. Lard. Shortening. Ghee. Bacon fat. Tropical oils, such as coconut, palm kernel, or palm oil.  Seasoning and other foods  Salted popcorn and pretzels. Onion salt, garlic salt, seasoned salt, table salt, and sea salt. Worcestershire sauce. Tartar sauce. Barbecue sauce. Teriyaki sauce. Soy sauce, including reduced-sodium. Steak sauce. Canned and packaged gravies. Fish sauce. Oyster sauce. Cocktail sauce. Horseradish that you find on the shelf. Ketchup. Mustard. Meat flavorings and tenderizers. Bouillon cubes. Hot sauce and Tabasco sauce. Premade or packaged marinades. Premade or packaged taco seasonings. Relishes. Regular salad dressings.  Where to find more information:   National Heart, Lung, and Blood Institute: www.nhlbi.nih.gov   American Heart Association: www.heart.org  Summary   The DASH eating plan is a healthy eating plan that has been shown to reduce high blood pressure (hypertension). It may also reduce your risk for type 2 diabetes, heart disease, and stroke.   With the   DASH eating plan, you should limit salt (sodium) intake to 2,300 mg a day. If you have hypertension, you may need to reduce your sodium intake to 1,500 mg a day.   When on the DASH eating plan, aim to eat more fresh fruits and vegetables, whole grains, lean proteins, low-fat dairy, and heart-healthy fats.   Work with your health care provider or diet and nutrition specialist (dietitian) to adjust your eating plan to your individual calorie needs.  This information is not intended to replace advice given to you by your health care provider. Make sure you discuss any questions you have with your health care provider.  Document Released: 01/30/2011 Document Revised: 02/04/2016 Document Reviewed: 02/04/2016  Elsevier Interactive Patient Education  2019 [employer]

## 2018-03-07 ENCOUNTER — Other Ambulatory Visit: Payer: Self-pay | Admitting: Nurse Practitioner

## 2018-03-07 DIAGNOSIS — I10 Essential (primary) hypertension: Secondary | ICD-10-CM

## 2018-03-12 ENCOUNTER — Encounter: Payer: Self-pay | Admitting: *Deleted

## 2018-03-18 ENCOUNTER — Encounter: Payer: Self-pay | Admitting: Cardiology

## 2018-03-28 ENCOUNTER — Other Ambulatory Visit: Payer: Self-pay | Admitting: Nurse Practitioner

## 2018-03-28 DIAGNOSIS — I10 Essential (primary) hypertension: Secondary | ICD-10-CM

## 2018-05-07 ENCOUNTER — Other Ambulatory Visit: Payer: Self-pay

## 2018-05-07 ENCOUNTER — Encounter: Payer: Self-pay | Admitting: Family Medicine

## 2018-05-07 ENCOUNTER — Ambulatory Visit: Payer: PRIVATE HEALTH INSURANCE | Admitting: Family Medicine

## 2018-05-07 VITALS — BP 133/60 | HR 50 | Temp 97.7°F | Ht 69.0 in | Wt 259.0 lb

## 2018-05-07 DIAGNOSIS — J069 Acute upper respiratory infection, unspecified: Secondary | ICD-10-CM

## 2018-05-07 MED ORDER — FLUTICASONE PROPIONATE 50 MCG/ACT NA SUSP: 2.0000 | Freq: Every day | NASAL | 6 refills | Status: DC

## 2018-05-07 MED ORDER — AMOXICILLIN-POT CLAVULANATE 875-125 MG PO TABS: 1.0000 | ORAL_TABLET | Freq: Two times a day (BID) | ORAL | 0 refills | Status: AC

## 2018-05-07 NOTE — Progress Notes (Signed)
   Subjective:     Lori Ortiz is a 63 y.o. female who presents for evaluation of symptoms of a URI, possible sinusitis. Symptoms include congestion, coryza, low grade fever, nasal congestion, post nasal drip, productive cough with  white and yellow colored sputum, purulent nasal discharge and sore throat. Onset of symptoms was 2 weeks ago, and has been gradually worsening since that time. Treatment to date: cough suppressants and decongestants.  The following portions of the patient's history were reviewed and updated as appropriate: allergies, current medications, past family history, past medical history, past social history, past surgical history and problem list.  Review of Systems Constitutional: positive for chills and malaise Eyes: negative Ears, nose, mouth, throat, and face: positive for nasal congestion and sore throat Respiratory: positive for cough and sputum Cardiovascular: negative Musculoskeletal:negative Neurological: negative   Objective:    BP 133/60   Pulse (!) 50   Temp 97.7 F (36.5 C) (Oral)   Ht 5\' 9"  (1.753 m)   Wt 259 lb (117.5 kg)   BMI 38.25 kg/m  General appearance: alert, cooperative and mild distress Head: Normocephalic, without obvious abnormality, atraumatic Eyes: conjunctivae/corneas clear. PERRL, EOM's intact. Fundi benign. Ears: abnormal TM right ear - serous middle ear fluid and abnormal TM left ear - serous middle ear fluid Nose: yellow discharge, moderate congestion, turbinates red, swollen, no maxillary sinus tenderness bilateral, no frontal sinus tenderness bilateral Throat: abnormal findings: moderate oropharyngeal erythema and postnasal drainage Neck: no adenopathy, no carotid bruit, no JVD, supple, symmetrical, trachea midline and thyroid not enlarged, symmetric, no tenderness/mass/nodules Lungs: clear to auscultation bilaterally Heart: regular rate and rhythm, S1, S2 normal, no murmur, click, rub or gallop Skin: Skin color, texture,  turgor normal. No rashes or lesions Neurologic: Grossly normal   Assessment:     Lori Ortiz was seen today for uri.  Diagnoses and all orders for this visit:  URI with cough and congestion Due to ongoing symptoms and failed conservative therapy, will treat with Augmentin. Symptomatic care discussed. Report any new or worsening symptoms.  -     amoxicillin-clavulanate (AUGMENTIN) 875-125 MG tablet; Take 1 tablet by mouth 2 (two) times daily for 10 days. -     fluticasone (FLONASE) 50 MCG/ACT nasal spray; Place 2 sprays into both nostrils daily.    Plan:    Discussed diagnosis and treatment of URI. Suggested symptomatic OTC remedies. Nasal saline spray for congestion. Augmentin per orders. Nasal steroids per orders. Follow up as needed. Call in 3 days if symptoms aren't resolving.    The above assessment and management plan was discussed with the patient. The patient verbalized understanding of and has agreed to the management plan. Patient is aware to call the clinic if symptoms fail to improve or worsen. Patient is aware when to return to the clinic for a follow-up visit. Patient educated on when it is appropriate to go to the emergency department.   Monia Pouch, FNP-C [facility] [address] [place], [place] [address] [phone]

## 2018-05-07 NOTE — Patient Instructions (Addendum)
It appears that you have a viral upper respiratory infection (cold).  Cold symptoms can last up to 2 weeks.    - Get plenty of rest and drink plenty of fluids. - Try to breathe moist air. Use a cold mist humidifier. - Consume warm fluids (soup or tea) to provide relief for a stuffy nose and to loosen phlegm. - For cough and congestion you can use plain Mucinex, regular or max strength, follow box directions.  - For nasal stuffiness, try saline nasal spray or a Neti Pot. You can use saline nasal spray 4 times daily. Do not use tap water in the Neti Pot, follow instructions on box for proper use. Afrin nasal spray can also be used but this product should not be used longer than 3 days or it will cause rebound nasal stuffiness (worsening nasal congestion). - For sore throat pain relief: use chloraseptic spray, suck on throat lozenges, hard candy or popsicles; gargle with warm salt water (1/4 tsp. salt per 8 oz. of water); and eat soft, bland foods. - For fever or aches and pains take tylenol or motrin as appropriate for age and weight.  - Eat a well-balanced diet. If you cannot, ensure you are getting enough nutrients by taking a daily multivitamin. - Avoid dairy products, as they can thicken phlegm. - Avoid alcohol, as it impairs your body's immune system. - Change your toothbrush in 3 days.   CONTACT YOUR DOCTOR IF YOU EXPERIENCE ANY OF THE FOLLOWING: - High fever - Ear pain - Sinus-type headache - Unusually severe cold symptoms - Cough that gets worse while other cold symptoms improve - Flare up of any chronic lung problem, such as asthma - Your symptoms persist longer than 2 weeks   

## 2018-05-24 ENCOUNTER — Encounter: Payer: Self-pay | Admitting: Family Medicine

## 2018-05-24 ENCOUNTER — Ambulatory Visit (INDEPENDENT_AMBULATORY_CARE_PROVIDER_SITE_OTHER): Payer: PRIVATE HEALTH INSURANCE | Admitting: Family Medicine

## 2018-05-24 ENCOUNTER — Other Ambulatory Visit: Payer: Self-pay

## 2018-05-24 DIAGNOSIS — J301 Allergic rhinitis due to pollen: Secondary | ICD-10-CM | POA: Diagnosis not present

## 2018-05-24 MED ORDER — LORATADINE 10 MG PO TABS: 10.0000 mg | ORAL_TABLET | Freq: Every day | ORAL | 11 refills | Status: DC

## 2018-05-24 MED ORDER — PSEUDOEPH-BROMPHEN-DM 30-2-10 MG/5ML PO SYRP: 5.0000 mL | ORAL_SOLUTION | Freq: Four times a day (QID) | ORAL | 0 refills | Status: DC | PRN

## 2018-05-24 NOTE — Progress Notes (Signed)
Virtual Visit via telephone Note  I connected with Lori Ortiz on 05/24/18 at 1410 by telephone and verified that I am speaking with the correct person using two identifiers. Lori Ortiz is currently located at home and no one is currently with them during visit. The provider, Monia Pouch, FNP is located in their office at time of visit.  I discussed the limitations, risks, security and privacy concerns of performing an evaluation and management service by telephone and the availability of in person appointments. I also discussed with the patient that there may be a patient responsible charge related to this service. The patient expressed understanding and agreed to proceed.  Subjective:  Patient ID: Lori Ortiz, female    DOB: October 12, 1955, 63 y.o.   MRN: [id number]  Chief Complaint:  Cough and Nasal Congestion   HPI: Lori Ortiz is a 63 y.o. female presenting on 05/24/2018 for Cough and Nasal Congestion   Pt reports ongoing cough and nasal congestion. She recently finished Augmentin for an URI. Pt states she does not have a fever or shortness of breath. States she has a tickle in the back of her throat that is causing her to cough. States she does cough up clear sputum at times. She is taking the Flonase with some relief of symptoms. States this has increased over the last two weeks and is worse when outside and at work. Mucinex is not helpful.    Relevant past medical, surgical, family, and social history reviewed and updated as indicated.  Allergies and medications reviewed and updated.   Past Medical History:  Diagnosis Date  . GERD (gastroesophageal reflux disease)   . Hypertension   . Stroke [facility])     History reviewed. No pertinent surgical history.  Social History   Socioeconomic History  . Marital status: Single    Spouse name: Not on file  . Number of children: Not on file  . Years of education: Not on file  . Highest education level: Not on file  Occupational  History  . Not on file  Social Needs  . Financial resource strain: Not on file  . Food insecurity:    Worry: Not on file    Inability: Not on file  . Transportation needs:    Medical: Not on file    Non-medical: Not on file  Tobacco Use  . Smoking status: Current Every Day Smoker    Packs/day: 0.50  . Smokeless tobacco: Never Used  Substance and Sexual Activity  . Alcohol use: No  . Drug use: No  . Sexual activity: Not on file  Lifestyle  . Physical activity:    Days per week: Not on file    Minutes per session: Not on file  . Stress: Not on file  Relationships  . Social connections:    Talks on phone: Not on file    Gets together: Not on file    Attends religious service: Not on file    Active member of club or organization: Not on file    Attends meetings of clubs or organizations: Not on file    Relationship status: Not on file  . Intimate partner violence:    Fear of current or ex partner: Not on file    Emotionally abused: Not on file    Physically abused: Not on file    Forced sexual activity: Not on file  Other Topics Concern  . Not on file  Social History Narrative  . Not on file  Outpatient Encounter Medications as of 05/24/2018  Medication Sig  . amLODipine (NORVASC) 10 MG tablet Take 1 tablet (10 mg total) by mouth daily.  . brompheniramine-pseudoephedrine-DM 30-2-10 MG/5ML syrup Take 5 mLs by mouth 4 (four) times daily as needed.  . diclofenac (VOLTAREN) 75 MG EC tablet TAKE 1 TABLET BY MOUTH TWICE A DAY  . fluticasone (FLONASE) 50 MCG/ACT nasal spray Place 2 sprays into both nostrils daily.  Marland Kitchen lisinopril (PRINIVIL,ZESTRIL) 20 MG tablet TAKE 1 TABLET (20 MG TOTAL) BY MOUTH DAILY. (NEEDS TO BE SEEN BEFORE NEXT REFILL)  . loratadine (CLARITIN) 10 MG tablet Take 1 tablet (10 mg total) by mouth daily.  . metoprolol tartrate (LOPRESSOR) 50 MG tablet TAKE 1 TABLET (50 MG TOTAL) BY MOUTH 2 (TWO) TIMES DAILY. (NEEDS TO BE SEEN BEFORE NEXT REFILL)  .  pantoprazole (PROTONIX) 40 MG tablet Take 1 tablet (40 mg total) by mouth daily. (Needs to be seen)  . pravastatin (PRAVACHOL) 40 MG tablet Take 1 tablet (40 mg total) by mouth daily.   No facility-administered encounter medications on file as of 05/24/2018.     No Known Allergies  Review of Systems  Constitutional: Negative for activity change, appetite change, chills, fatigue and fever.  HENT: Positive for congestion, postnasal drip, rhinorrhea and sneezing. Negative for ear pain, sinus pressure, sinus pain, sore throat, tinnitus, trouble swallowing and voice change.   Eyes: Positive for itching. Negative for photophobia, pain, discharge, redness and visual disturbance.  Respiratory: Positive for cough. Negative for shortness of breath and wheezing.   Cardiovascular: Negative for chest pain, palpitations and leg swelling.  Musculoskeletal: Negative for arthralgias and myalgias.  Skin: Negative for color change, rash and wound.  Neurological: Negative for dizziness, weakness, light-headedness and headaches.  Psychiatric/Behavioral: Negative for confusion.  All other systems reviewed and are negative.        Observations/Objective: No VS, this was a telephone or virtual health encounter.  Pt alert and oriented, answers all questions appropriately, and able to speak in full sentences.    Assessment and Plan: Lori Ortiz was seen today for cough and nasal congestion.  Diagnoses and all orders for this visit:  Seasonal allergic rhinitis due to pollen Signs and symptoms persistent with allergic rhinitis. Continue Flonase. Add daily Claritin. Bromfed as needed for cough and congestion.  -     brompheniramine-pseudoephedrine-DM 30-2-10 MG/5ML syrup; Take 5 mLs by mouth 4 (four) times daily as needed. -     loratadine (CLARITIN) 10 MG tablet; Take 1 tablet (10 mg total) by mouth daily.     Follow Up Instructions: Return if symptoms worsen or fail to improve.    I discussed the  assessment and treatment plan with the patient. The patient was provided an opportunity to ask questions and all were answered. The patient agreed with the plan and demonstrated an understanding of the instructions.   The patient was advised to call back or seek an in-person evaluation if the symptoms worsen or if the condition fails to improve as anticipated.  The above assessment and management plan was discussed with the patient. The patient verbalized understanding of and has agreed to the management plan. Patient is aware to call the clinic if symptoms persist or worsen. Patient is aware when to return to the clinic for a follow-up visit. Patient educated on when it is appropriate to go to the emergency department.    I provided 10 minutes of non-face-to-face time during this encounter. The call started at 1410. The call ended  at 1420.   Monia Pouch, FNP-C [facility] [address] [place], [place] [address] [phone]

## 2018-06-03 ENCOUNTER — Ambulatory Visit: Payer: Self-pay | Admitting: Physician Assistant

## 2018-09-02 ENCOUNTER — Other Ambulatory Visit: Payer: Self-pay

## 2018-09-03 ENCOUNTER — Ambulatory Visit: Payer: PRIVATE HEALTH INSURANCE | Admitting: Nurse Practitioner

## 2018-09-03 ENCOUNTER — Encounter: Payer: Self-pay | Admitting: Nurse Practitioner

## 2018-09-03 VITALS — BP 136/82 | HR 49 | Temp 97.0°F | Ht 69.0 in | Wt 263.0 lb

## 2018-09-03 DIAGNOSIS — R011 Cardiac murmur, unspecified: Secondary | ICD-10-CM

## 2018-09-03 DIAGNOSIS — E782 Mixed hyperlipidemia: Secondary | ICD-10-CM

## 2018-09-03 DIAGNOSIS — I10 Essential (primary) hypertension: Secondary | ICD-10-CM | POA: Diagnosis not present

## 2018-09-03 DIAGNOSIS — K219 Gastro-esophageal reflux disease without esophagitis: Secondary | ICD-10-CM | POA: Diagnosis not present

## 2018-09-03 DIAGNOSIS — M8580 Other specified disorders of bone density and structure, unspecified site: Secondary | ICD-10-CM | POA: Diagnosis not present

## 2018-09-03 MED ORDER — METOPROLOL TARTRATE 50 MG PO TABS: 50.0000 mg | ORAL_TABLET | Freq: Two times a day (BID) | ORAL | 1 refills | Status: DC

## 2018-09-03 MED ORDER — PRAVASTATIN SODIUM 40 MG PO TABS: 40.0000 mg | ORAL_TABLET | Freq: Every day | ORAL | 1 refills | Status: DC

## 2018-09-03 MED ORDER — PANTOPRAZOLE SODIUM 40 MG PO TBEC: 40.0000 mg | DELAYED_RELEASE_TABLET | Freq: Every day | ORAL | 1 refills | Status: DC

## 2018-09-03 MED ORDER — LISINOPRIL 20 MG PO TABS: 20.0000 mg | ORAL_TABLET | Freq: Every day | ORAL | 1 refills | Status: DC

## 2018-09-03 MED ORDER — AMLODIPINE BESYLATE 10 MG PO TABS: 10.0000 mg | ORAL_TABLET | Freq: Every day | ORAL | 1 refills | Status: DC

## 2018-09-03 NOTE — Patient Instructions (Signed)

## 2018-09-03 NOTE — Progress Notes (Signed)
Subjective:    Patient ID: Lori Ortiz, female    DOB: February 02, 1956, 63 y.o.   MRN: [id number]   Chief Complaint: Medical Management of Chronic Issues    HPI:  1. Essential hypertension No c/o chest pain, sob or headache. Does not check blood pressure at home. BP Readings from Last 3 Encounters:  05/07/18 133/60  03/05/18 (!) 165/92  01/18/18 (!) 170/89    2. Mixed hyperlipidemia Lori Ortiz snot watch diet and does no dedicated exercise. She does state that she stays very active though.  3. Gastroesophageal reflux disease, esophagitis presence not specified Is on protonix and works well for her.  4. Osteopenia, unspecified location Last dexascan was done 03/05/18 with t score of -1.9.Marland Kitchen takes vitamin d daily  5. Heart murmur Does not see cardiologist and says she is fine.  6. Morbid obesity ([place]) No recent weight changes    Outpatient Encounter Medications as of 09/03/2018  Medication Sig  . amLODipine (NORVASC) 10 MG tablet Take 1 tablet (10 mg total) by mouth daily.  . brompheniramine-pseudoephedrine-DM 30-2-10 MG/5ML syrup Take 5 mLs by mouth 4 (four) times daily as needed.  . diclofenac (VOLTAREN) 75 MG EC tablet TAKE 1 TABLET BY MOUTH TWICE A DAY  . fluticasone (FLONASE) 50 MCG/ACT nasal spray Place 2 sprays into both nostrils daily.  Marland Kitchen lisinopril (PRINIVIL,ZESTRIL) 20 MG tablet TAKE 1 TABLET (20 MG TOTAL) BY MOUTH DAILY. (NEEDS TO BE SEEN BEFORE NEXT REFILL)  . loratadine (CLARITIN) 10 MG tablet Take 1 tablet (10 mg total) by mouth daily.  . metoprolol tartrate (LOPRESSOR) 50 MG tablet TAKE 1 TABLET (50 MG TOTAL) BY MOUTH 2 (TWO) TIMES DAILY. (NEEDS TO BE SEEN BEFORE NEXT REFILL)  . pantoprazole (PROTONIX) 40 MG tablet Take 1 tablet (40 mg total) by mouth daily. (Needs to be seen)  . pravastatin (PRAVACHOL) 40 MG tablet Take 1 tablet (40 mg total) by mouth daily.       Family History  Problem Relation Age of Onset  . Heart disease Mother   . Diabetes Mother   .  Cancer Mother   . Aneurysm Father   . Hypertension Father   . Hypertension Brother     New complaints: None  today  Social history: Works at [employer] in [employer]  Controlled substance contract: N/A    Review of Systems  Constitutional: Negative for activity change and appetite change.  HENT: Negative.   Eyes: Negative for pain.  Respiratory: Negative for shortness of breath.   Cardiovascular: Negative for chest pain, palpitations and leg swelling (occasionally - especislly when she works).  Gastrointestinal: Negative for abdominal pain.  Endocrine: Negative for polydipsia.  Genitourinary: Negative.   Skin: Negative for rash.  Neurological: Negative for dizziness, weakness and headaches.  Hematological: Does not bruise/bleed easily.  Psychiatric/Behavioral: Negative.   All other systems reviewed and are negative.      Objective:   Physical Exam Vitals signs and nursing note reviewed.  Constitutional:      General: She is not in acute distress.    Appearance: Normal appearance. She is well-developed.  HENT:     Head: Normocephalic.     Nose: Nose normal.  Eyes:     Pupils: Pupils are equal, round, and reactive to light.  Neck:     Musculoskeletal: Normal range of motion and neck supple.     Vascular: No carotid bruit or JVD.  Cardiovascular:     Rate and Rhythm: Normal rate and regular rhythm.  Heart sounds: Normal heart sounds.  Pulmonary:     Effort: Pulmonary effort is normal. No respiratory distress.     Breath sounds: Normal breath sounds. No wheezing or rales.  Chest:     Chest wall: No tenderness.  Abdominal:     General: Bowel sounds are normal. There is no distension or abdominal bruit.     Palpations: Abdomen is soft. There is no hepatomegaly, splenomegaly, mass or pulsatile mass.     Tenderness: There is no abdominal tenderness.  Musculoskeletal: Normal range of motion.     Right lower leg: Edema (mild) present.     Left lower leg: Edema (mild)  present.  Lymphadenopathy:     Cervical: No cervical adenopathy.  Skin:    General: Skin is warm and dry.  Neurological:     Mental Status: She is alert and oriented to person, place, and time.     Deep Tendon Reflexes: Reflexes are normal and symmetric.  Psychiatric:        Behavior: Behavior normal.        Thought Content: Thought content normal.        Judgment: Judgment normal.     BP 136/82   Pulse (!) 49   Temp (!) 97 F (36.1 C) (Oral)   Ht '5\' 9"'  (1.753 m)   Wt 263 lb (119.3 kg)   BMI 38.84 kg/m         Assessment & Plan:  Lori Ortiz comes in today with chief complaint of Medical Management of Chronic Issues   Diagnosis and orders addressed:  1. Essential hypertension Low sodium diet - metoprolol tartrate (LOPRESSOR) 50 MG tablet; Take 1 tablet (50 mg total) by mouth 2 (two) times daily.  Dispense: 180 tablet; Refill: 1 - lisinopril (ZESTRIL) 20 MG tablet; Take 1 tablet (20 mg total) by mouth daily. (Needs to be seen before next refill)  Dispense: 90 tablet; Refill: 1 - amLODipine (NORVASC) 10 MG tablet; Take 1 tablet (10 mg total) by mouth daily.  Dispense: 90 tablet; Refill: 1 - CMP14+EGFR  2. Mixed hyperlipidemia Low fat diet - pravastatin (PRAVACHOL) 40 MG tablet; Take 1 tablet (40 mg total) by mouth daily.  Dispense: 90 tablet; Refill: 1 - Lipid panel  3. Gastroesophageal reflux disease, esophagitis presence not specified Avoid spicy foods Do not eat 2 hours prior to bedtime - pantoprazole (PROTONIX) 40 MG tablet; Take 1 tablet (40 mg total) by mouth daily. (Needs to be seen)  Dispense: 90 tablet; Refill: 1  4. Osteopenia, unspecified location Weight bearing exercise  5. Heart murmur  6. Morbid obesity ([place]) Discussed diet and exercise for person with BMI >25 Will recheck weight in 3-6 months   Labs pending Health Maintenance reviewed Diet and exercise encouraged  Follow up plan: 6 months- will do PAP then   [place], FNP

## 2018-09-04 LAB — CMP14+EGFR
ALT: 10 IU/L (ref 0–32)
AST: 15 IU/L (ref 0–40)
Albumin/Globulin Ratio: 1.9 (ref 1.2–2.2)
Albumin: 4.1 g/dL (ref 3.8–4.8)
Alkaline Phosphatase: 102 IU/L (ref 39–117)
BUN/Creatinine Ratio: 19 (ref 12–28)
BUN: 29 mg/dL — ABNORMAL HIGH (ref 8–27)
Bilirubin Total: 0.3 mg/dL (ref 0.0–1.2)
CO2: 21 mmol/L (ref 20–29)
Calcium: 10 mg/dL (ref 8.7–10.3)
Chloride: 105 mmol/L (ref 96–106)
Creatinine, Ser: 1.54 mg/dL — ABNORMAL HIGH (ref 0.57–1.00)
GFR calc Af Amer: 41 mL/min/{1.73_m2} — ABNORMAL LOW (ref >59)
GFR calc non Af Amer: 35 mL/min/{1.73_m2} — ABNORMAL LOW (ref >59)
Globulin, Total: 2.2 g/dL (ref 1.5–4.5)
Glucose: 112 mg/dL — ABNORMAL HIGH (ref 65–99)
Potassium: 4.9 mmol/L (ref 3.5–5.2)
Sodium: 141 mmol/L (ref 134–144)
Total Protein: 6.3 g/dL (ref 6.0–8.5)

## 2018-09-04 LAB — LIPID PANEL
Chol/HDL Ratio: 5.1 ratio — ABNORMAL HIGH (ref 0.0–4.4)
Cholesterol, Total: 195 mg/dL (ref 100–199)
HDL: 38 mg/dL — ABNORMAL LOW (ref >39)
LDL Calculated: 125 mg/dL — ABNORMAL HIGH (ref 0–99)
Triglycerides: 161 mg/dL — ABNORMAL HIGH (ref 0–149)
VLDL Cholesterol Cal: 32 mg/dL (ref 5–40)

## 2019-02-25 ENCOUNTER — Other Ambulatory Visit: Payer: Self-pay | Admitting: Nurse Practitioner

## 2019-02-25 DIAGNOSIS — I10 Essential (primary) hypertension: Secondary | ICD-10-CM

## 2019-03-04 ENCOUNTER — Other Ambulatory Visit: Payer: Self-pay

## 2019-03-07 ENCOUNTER — Other Ambulatory Visit: Payer: Self-pay

## 2019-03-07 ENCOUNTER — Encounter: Payer: Self-pay | Admitting: Nurse Practitioner

## 2019-03-07 ENCOUNTER — Ambulatory Visit: Payer: PRIVATE HEALTH INSURANCE | Admitting: Nurse Practitioner

## 2019-03-07 VITALS — BP 153/77 | HR 61 | Temp 97.0°F | Resp 20 | Ht 69.0 in | Wt 267.0 lb

## 2019-03-07 DIAGNOSIS — Z124 Encounter for screening for malignant neoplasm of cervix: Secondary | ICD-10-CM

## 2019-03-07 DIAGNOSIS — I10 Essential (primary) hypertension: Secondary | ICD-10-CM

## 2019-03-07 DIAGNOSIS — Z0000 Encounter for general adult medical examination without abnormal findings: Secondary | ICD-10-CM

## 2019-03-07 DIAGNOSIS — K219 Gastro-esophageal reflux disease without esophagitis: Secondary | ICD-10-CM | POA: Diagnosis not present

## 2019-03-07 DIAGNOSIS — Z0001 Encounter for general adult medical examination with abnormal findings: Secondary | ICD-10-CM | POA: Diagnosis not present

## 2019-03-07 DIAGNOSIS — M25562 Pain in left knee: Secondary | ICD-10-CM

## 2019-03-07 DIAGNOSIS — M8588 Other specified disorders of bone density and structure, other site: Secondary | ICD-10-CM | POA: Diagnosis not present

## 2019-03-07 DIAGNOSIS — E782 Mixed hyperlipidemia: Secondary | ICD-10-CM | POA: Diagnosis not present

## 2019-03-07 LAB — MICROSCOPIC EXAMINATION
Bacteria, UA: NONE SEEN
RBC, Urine: NONE SEEN /hpf (ref 0–2)
Renal Epithel, UA: NONE SEEN /hpf

## 2019-03-07 LAB — URINALYSIS, COMPLETE
Bilirubin, UA: NEGATIVE
Glucose, UA: NEGATIVE
Ketones, UA: NEGATIVE
Leukocytes,UA: NEGATIVE
Nitrite, UA: NEGATIVE
Protein,UA: NEGATIVE
RBC, UA: NEGATIVE
Specific Gravity, UA: 1.02 (ref 1.005–1.030)
Urobilinogen, Ur: 0.2 mg/dL (ref 0.2–1.0)
pH, UA: 7 (ref 5.0–7.5)

## 2019-03-07 MED ORDER — LISINOPRIL 20 MG PO TABS: 20.0000 mg | ORAL_TABLET | Freq: Every day | ORAL | 1 refills | Status: DC

## 2019-03-07 MED ORDER — BUPIVACAINE HCL 0.25 % IJ SOLN
1.0000 mL | Freq: Once | INTRAMUSCULAR | Status: AC
  Administered 2019-03-07: 1 mL via INTRA_ARTICULAR

## 2019-03-07 MED ORDER — PANTOPRAZOLE SODIUM 40 MG PO TBEC: 40.0000 mg | DELAYED_RELEASE_TABLET | Freq: Every day | ORAL | 1 refills | Status: DC

## 2019-03-07 MED ORDER — AMLODIPINE BESYLATE 10 MG PO TABS: 10.0000 mg | ORAL_TABLET | Freq: Every day | ORAL | 1 refills | Status: DC

## 2019-03-07 MED ORDER — METHYLPREDNISOLONE ACETATE 40 MG/ML IJ SUSP
40.0000 mg | Freq: Once | INTRAMUSCULAR | Status: AC
  Administered 2019-03-07: 40 mg via INTRAMUSCULAR

## 2019-03-07 MED ORDER — METOPROLOL TARTRATE 50 MG PO TABS: 50.0000 mg | ORAL_TABLET | Freq: Two times a day (BID) | ORAL | 1 refills | Status: DC

## 2019-03-07 MED ORDER — ATORVASTATIN CALCIUM 40 MG PO TABS: 40.0000 mg | ORAL_TABLET | Freq: Every day | ORAL | 1 refills | Status: DC

## 2019-03-07 NOTE — Progress Notes (Addendum)
Subjective:    Patient ID: Lori Ortiz, female    DOB: 08/31/1955, 64 y.o.   MRN: [id number]   Chief Complaint:  Annual physical   HPI:  1. Annual physical exam   2. Essential hypertension No c/o chest pain, sob or headache. Does not check blood pressure at home. BP Readings from Last 3 Encounters:  09/03/18 136/82  05/07/18 133/60  03/05/18 (!) 165/92     3. Mixed hyperlipidemia dosnot watch diet and does very little exercise. Lab Results  Component Value Date   CHOL 195 09/03/2018   HDL 38 (L) 09/03/2018   LDLCALC 125 (H) 09/03/2018   TRIG 161 (H) 09/03/2018   CHOLHDL 5.1 (H) 09/03/2018     4. Gastroesophageal reflux disease, unspecified whether esophagitis present Is on protonix daily and works well to keep symptoms under control.  5. Osteopenia of lumbar spine Last dexascan was done on 03/05/18 with t score of -1.9. she does very little weight bearing exercises.  6. Morbid obesity ([place]) No recent weight changes Wt Readings from Last 3 Encounters:  09/03/18 263 lb (119.3 kg)  05/07/18 259 lb (117.5 kg)  03/05/18 261 lb (118.4 kg)   BMI Readings from Last 3 Encounters:  09/03/18 38.84 kg/m  05/07/18 38.25 kg/m  03/05/18 38.54 kg/m       Outpatient Encounter Medications as of 03/07/2019  Medication Sig  . amLODipine (NORVASC) 10 MG tablet TAKE 1 TABLET BY MOUTH EVERY DAY  . diclofenac (VOLTAREN) 75 MG EC tablet TAKE 1 TABLET BY MOUTH TWICE A DAY  . lisinopril (ZESTRIL) 20 MG tablet Take 1 tablet (20 mg total) by mouth daily. (Needs to be seen before next refill)  . loratadine (CLARITIN) 10 MG tablet Take 1 tablet (10 mg total) by mouth daily.  . metoprolol tartrate (LOPRESSOR) 50 MG tablet Take 1 tablet (50 mg total) by mouth 2 (two) times daily.  . pantoprazole (PROTONIX) 40 MG tablet Take 1 tablet (40 mg total) by mouth daily. (Needs to be seen)  . pravastatin (PRAVACHOL) 40 MG tablet Take 1 tablet (40 mg total) by mouth daily.    No past  surgical history on file.  Family History  Problem Relation Age of Onset  . Heart disease Mother   . Diabetes Mother   . Cancer Mother   . Aneurysm Father   . Hypertension Father   . Hypertension Brother     New complaints: Left kne pain. Has had for several years. Has had steroid injection in past but has been several years  Social history: Lives with husband of 19 yearsa    Controlled substance contract: n/a    Review of Systems  Constitutional: Negative for diaphoresis.  Eyes: Negative for pain.  Respiratory: Negative for shortness of breath.   Cardiovascular: Negative for chest pain, palpitations and leg swelling.  Gastrointestinal: Negative for abdominal pain.  Endocrine: Negative for polydipsia.  Skin: Negative for rash.  Neurological: Negative for dizziness, weakness and headaches.  Hematological: Does not bruise/bleed easily.  All other systems reviewed and are negative.      Objective:   Physical Exam Vitals and nursing note reviewed.  Constitutional:      General: She is not in acute distress.    Appearance: Normal appearance. She is well-developed.  HENT:     Head: Normocephalic.     Nose: Nose normal.  Eyes:     Pupils: Pupils are equal, round, and reactive to light.  Neck:     Vascular: No  carotid bruit or JVD.  Cardiovascular:     Rate and Rhythm: Normal rate and regular rhythm.     Heart sounds: Normal heart sounds.  Pulmonary:     Effort: Pulmonary effort is normal. No respiratory distress.     Breath sounds: Normal breath sounds. No wheezing or rales.  Chest:     Chest wall: No tenderness.  Abdominal:     General: Bowel sounds are normal. There is no distension or abdominal bruit.     Palpations: Abdomen is soft. There is no hepatomegaly, splenomegaly, mass or pulsatile mass.     Tenderness: There is no abdominal tenderness.  Genitourinary:    General: Normal vulva.     Vagina: No vaginal discharge.     Rectum: Normal.     Comments:  cervx nonparous and pink No adnexal masses or tenderness Musculoskeletal:        General: Normal range of motion.     Cervical back: Normal range of motion and neck supple.     Comments: FROM of left knee pain with crepitus on flexion and extension. No effusion No patella tenderness All ligaments intact  Lymphadenopathy:     Cervical: No cervical adenopathy.  Skin:    General: Skin is warm and dry.  Neurological:     Mental Status: She is alert and oriented to person, place, and time.     Deep Tendon Reflexes: Reflexes are normal and symmetric.  Psychiatric:        Behavior: Behavior normal.        Thought Content: Thought content normal.        Judgment: Judgment normal.     BP (!) 153/77   Pulse 61   Temp (!) 97 F (36.1 C) (Temporal)   Resp 20   Ht '5\' 9"'  (1.753 m)   Wt 267 lb (121.1 kg)   SpO2 97%   BMI 39.43 kg/m    EKG- Lori Hough, FNP   Joint Injection/Arthrocentesis  Date/Time: 03/07/2019 8:59 AM Performed by: Lori Pretty, FNP Authorized by: Lori Done Mary-Margaret, FNP  Indications: pain  Body area: knee Local anesthesia used: no  Anesthesia: Local anesthesia used: no  Sedation: Patient sedated: no  Preparation: Patient was prepped and draped in the usual sterile fashion. Needle size: 22 G Ultrasound guidance: no Approach: inferior Aspirate amount: 0 mL Methylprednisolone amount: 40 mg Bupivacaine 0.25% amount: 1 mL Patient tolerance: patient tolerated the procedure well with no immediate complications         Assessment & Plan:  Lori Ortiz comes in today with chief complaint of Annual Exam   Diagnosis and orders addressed:  1. Annual physical exam - CBC with Differential/Platelet - Thyroid Panel With TSH - urinalysis- dip and micro - EKG 12-Lead  2. Essential hypertension Low sodium diet - CMP14+EGFR - amLODipine (NORVASC) 10 MG tablet; Take 1 tablet (10 mg total) by mouth daily.  Dispense: 90 tablet;  Refill: 1 - metoprolol tartrate (LOPRESSOR) 50 MG tablet; Take 1 tablet (50 mg total) by mouth 2 (two) times daily.  Dispense: 180 tablet; Refill: 1 - lisinopril (ZESTRIL) 20 MG tablet; Take 1 tablet (20 mg total) by mouth daily. (Needs to be seen before next refill)  Dispense: 90 tablet; Refill: 1  3. Mixed hyperlipidemia Low fta diet Stopped pravachol and change to  lipitor daily - Lipid panel - atorvastatin (LIPITOR) 40 MG tablet; Take 1 tablet (40 mg total) by mouth daily.  Dispense: 90 tablet; Refill: 1  4. Gastroesophageal reflux disease,  unspecified whether esophagitis present\ Avoid spicy foods Do not eat 2 hours prior to bedtime  pantoprazole (PROTONIX) 40 MG tablet; Take 1 tablet (40 mg total) by mouth daily. (Needs to be seen)  Dispense: 90 tablet; Refill: 1  5. Osteopenia of lumbar spine Weight bearing exercise encouraged  6. Morbid obesity ([place]) Discussed diet and exercise for person with BMI >25 Will recheck weight in 3-6 months   7. Pap smear for cervical cancer screening - PAP age 61-65  - 24. Acute pain of left knee - methylPREDNISolone acetate (DEPO-MEDROL) injection 40 mg - bupivacaine (MARCAINE) 0.25 % (with pres) injection 1 mL   Labs pending Health Maintenance reviewed Diet and exercise encouraged  Follow up plan: 6 months   [place], FNP

## 2019-03-07 NOTE — Patient Instructions (Signed)
Knee Injection A knee injection is a procedure to get medicine into your knee joint to relieve the pain, swelling, and stiffness of arthritis. Your health care provider uses a needle to inject medicine, which may also help to lubricate and cushion your knee joint. You may need more than one injection. Tell a health care provider about:  Any allergies you have.  All medicines you are taking, including vitamins, herbs, eye drops, creams, and over-the-counter medicines.  Any problems you or family members have had with anesthetic medicines.  Any blood disorders you have.  Any surgeries you have had.  Any medical conditions you have.  Whether you are pregnant or may be pregnant. What are the risks? Generally, this is a safe procedure. However, problems may occur, including:  Infection.  Bleeding.  Symptoms that get worse.  Damage to the area around your knee.  Allergic reaction to any of the medicines.  Skin reactions from repeated injections. What happens before the procedure?  Ask your health care provider about changing or stopping your regular medicines. This is especially important if you are taking diabetes medicines or blood thinners.  Plan to have someone take you home from the hospital or clinic. What happens during the procedure?   You will sit or lie down in a position for your knee to be treated.  The skin over your kneecap will be cleaned with a germ-killing soap.  You will be given a medicine that numbs the area (local anesthetic). You may feel some stinging.  The medicine will be injected into your knee. The needle is carefully placed between your kneecap and your knee. The medicine is injected into the joint space.  The needle will be removed at the end of the procedure.  A bandage (dressing) may be placed over the injection site. The procedure may vary among health care providers and hospitals. What can I expect after the procedure?  Your blood  pressure, heart rate, breathing rate, and blood oxygen level will be monitored until you leave the hospital or clinic.  You may have to move your knee through its full range of motion. This helps to get all the medicine into your joint space.  You will be watched to make sure that you do not have a reaction to the injected medicine.  You may feel more pain, swelling, and warmth than you did before the injection. This reaction may last about 1-2 days. Follow these instructions at home: Medicines  Take over-the-counter and prescription medicines only as told by your doctor.  Do not drive or use heavy machinery while taking prescription pain medicine.  Do not take medicines such as aspirin and ibuprofen unless your health care provider tells you to take them. Injection site care  Follow instructions from your health care provider about: ? How to take care of your puncture site. ? When and how you should change your dressing. ? When you should remove your dressing.  Check your injection area every day for signs of infection. Check for: ? More redness, swelling, or pain after 2 days. ? Fluid or blood. ? Pus or a bad smell. ? Warmth. Managing pain, stiffness, and swelling   If directed, put ice on the injection area: ? Put ice in a plastic bag. ? Place a towel between your skin and the bag. ? Leave the ice on for 20 minutes, 2-3 times per day.  Do not apply heat to your knee.  Raise (elevate) the injection area above the level   of your heart while you are sitting or lying down. General instructions  If you were given a dressing, keep it dry until your health care provider says it can be removed. Ask your health care provider when you can start showering or taking a bath.  Avoid strenuous activities for as long as directed by your health care provider. Ask your health care provider when you can return to your normal activities.  Keep all follow-up visits as told by your health  care provider. This is important. You may need more injections. Contact a health care provider if you have:  A fever.  Warmth in your injection area.  Fluid, blood, or pus coming from your injection site.  Symptoms at your injection site that last longer than 2 days after your procedure. Get help right away if:  Your knee: ? Turns very red. ? Becomes very swollen. ? Is in severe pain. Summary  A knee injection is a procedure to get medicine into your knee joint to relieve the pain, swelling, and stiffness of arthritis.  A needle is carefully placed between your kneecap and your knee to inject medicine into the joint space.  Before the procedure, ask your health care provider about changing or stopping your regular medicines, especially if you are taking diabetes medicines or blood thinners.  Contact your health care provider if you have any problems or questions after your procedure. This information is not intended to replace advice given to you by your health care provider. Make sure you discuss any questions you have with your health care provider. Document Revised: 03/02/2017 Document Reviewed: 03/02/2017 Elsevier Patient Education  2020 [employer]  

## 2019-03-08 LAB — CBC WITH DIFFERENTIAL/PLATELET
Basophils Absolute: 0.1 10*3/uL (ref 0.0–0.2)
Basos: 1 %
EOS (ABSOLUTE): 0.3 10*3/uL (ref 0.0–0.4)
Eos: 5 %
Hematocrit: 34.8 % (ref 34.0–46.6)
Hemoglobin: 11.4 g/dL (ref 11.1–15.9)
Immature Grans (Abs): 0 10*3/uL (ref 0.0–0.1)
Immature Granulocytes: 0 %
Lymphocytes Absolute: 1.7 10*3/uL (ref 0.7–3.1)
Lymphs: 26 %
MCH: 30.2 pg (ref 26.6–33.0)
MCHC: 32.8 g/dL (ref 31.5–35.7)
MCV: 92 fL (ref 79–97)
Monocytes Absolute: 0.4 10*3/uL (ref 0.1–0.9)
Monocytes: 7 %
Neutrophils Absolute: 4 10*3/uL (ref 1.4–7.0)
Neutrophils: 61 %
Platelets: 272 10*3/uL (ref 150–450)
RBC: 3.77 x10E6/uL (ref 3.77–5.28)
RDW: 12.6 % (ref 11.7–15.4)
WBC: 6.5 10*3/uL (ref 3.4–10.8)

## 2019-03-08 LAB — LIPID PANEL
Chol/HDL Ratio: 4.1 ratio (ref 0.0–4.4)
Cholesterol, Total: 150 mg/dL (ref 100–199)
HDL: 37 mg/dL — ABNORMAL LOW (ref >39)
LDL Chol Calc (NIH): 96 mg/dL (ref 0–99)
Triglycerides: 89 mg/dL (ref 0–149)
VLDL Cholesterol Cal: 17 mg/dL (ref 5–40)

## 2019-03-08 LAB — CMP14+EGFR
ALT: 9 IU/L (ref 0–32)
AST: 11 IU/L (ref 0–40)
Albumin/Globulin Ratio: 1.7 (ref 1.2–2.2)
Albumin: 3.9 g/dL (ref 3.8–4.8)
Alkaline Phosphatase: 113 IU/L (ref 39–117)
BUN/Creatinine Ratio: 20 (ref 12–28)
BUN: 31 mg/dL — ABNORMAL HIGH (ref 8–27)
Bilirubin Total: 0.3 mg/dL (ref 0.0–1.2)
CO2: 26 mmol/L (ref 20–29)
Calcium: 9.5 mg/dL (ref 8.7–10.3)
Chloride: 106 mmol/L (ref 96–106)
Creatinine, Ser: 1.55 mg/dL — ABNORMAL HIGH (ref 0.57–1.00)
GFR calc Af Amer: 41 mL/min/{1.73_m2} — ABNORMAL LOW (ref >59)
GFR calc non Af Amer: 35 mL/min/{1.73_m2} — ABNORMAL LOW (ref >59)
Globulin, Total: 2.3 g/dL (ref 1.5–4.5)
Glucose: 101 mg/dL — ABNORMAL HIGH (ref 65–99)
Potassium: 5.2 mmol/L (ref 3.5–5.2)
Sodium: 141 mmol/L (ref 134–144)
Total Protein: 6.2 g/dL (ref 6.0–8.5)

## 2019-03-08 LAB — THYROID PANEL WITH TSH
Free Thyroxine Index: 1.9 (ref 1.2–4.9)
T3 Uptake Ratio: 26 % (ref 24–39)
T4, Total: 7.2 ug/dL (ref 4.5–12.0)
TSH: 1.37 u[IU]/mL (ref 0.450–4.500)

## 2019-03-11 LAB — IGP,CTNGTV,APT HPV
Chlamydia, Nuc. Acid Amp: NEGATIVE
Gonococcus, Nuc. Acid Amp: NEGATIVE
HPV Aptima: NEGATIVE
Trich vag by NAA: NEGATIVE

## 2019-04-07 ENCOUNTER — Ambulatory Visit: Payer: Worker's Compensation | Attending: Internal Medicine

## 2019-04-07 ENCOUNTER — Other Ambulatory Visit: Payer: Self-pay

## 2019-04-07 DIAGNOSIS — Z20822 Contact with and (suspected) exposure to covid-19: Secondary | ICD-10-CM

## 2019-04-08 ENCOUNTER — Telehealth: Payer: Self-pay | Admitting: Nurse Practitioner

## 2019-04-08 LAB — NOVEL CORONAVIRUS, NAA: SARS-CoV-2, NAA: DETECTED — AB

## 2019-04-08 NOTE — Telephone Encounter (Signed)
Patient tested positive for Covid yesterday.  She is interested in receiving the therapy that includes the antibodies and is wondering with her health conditions would she qualify.

## 2019-04-09 ENCOUNTER — Other Ambulatory Visit: Payer: Self-pay | Admitting: Internal Medicine

## 2019-04-09 DIAGNOSIS — I10 Essential (primary) hypertension: Secondary | ICD-10-CM

## 2019-04-09 DIAGNOSIS — U071 COVID-19: Secondary | ICD-10-CM

## 2019-04-09 NOTE — Progress Notes (Unsigned)
  I connected by phone with Lori Ortiz on 04/09/2019 at 9:06 AM to discuss the potential use of an new treatment for mild to moderate COVID-19 viral infection in non-hospitalized patients.  This patient is a 64 y.o. female that meets the FDA criteria for Emergency Use Authorization of bamlanivimab or casirivimab\imdevimab.  Has a (+) direct SARS-CoV-2 viral test result  Has mild or moderate COVID-19   Is ? 65 years of age and weighs ? 40 kg  Is NOT hospitalized due to COVID-19  Is NOT requiring oxygen therapy or requiring an increase in baseline oxygen flow rate due to COVID-19  Is within 10 days of symptom onset  Has at least one of the high risk factor(s) for progression to severe COVID-19 and/or hospitalization as defined in EUA.  Specific high risk criteria : Hypertension and hx of CVA   I have spoken and communicated the following to the patient or parent/caregiver:  1. FDA has authorized the emergency use of bamlanivimab and casirivimab\imdevimab for the treatment of mild to moderate COVID-19 in adults and pediatric patients with positive results of direct SARS-CoV-2 viral testing who are 40 years of age and older weighing at least 40 kg, and who are at high risk for progressing to severe COVID-19 and/or hospitalization.  2. The significant known and potential risks and benefits of bamlanivimab and casirivimab\imdevimab, and the extent to which such potential risks and benefits are unknown.  3. Information on available alternative treatments and the risks and benefits of those alternatives, including clinical trials.  4. Patients treated with bamlanivimab and casirivimab\imdevimab should continue to self-isolate and use infection control measures (e.g., wear mask, isolate, social distance, avoid sharing personal items, clean and disinfect "high touch" surfaces, and frequent handwashing) according to CDC guidelines.   5. The patient or parent/caregiver has the option to accept or  refuse bamlanivimab or casirivimab\imdevimab .  After reviewing this information with the patient, The patient agreed to proceed with receiving the bamlanimivab infusion and will be provided a copy of the Fact sheet prior to receiving the infusion.   Infusion scheduled for 2/14 at 1030.   Alan Ripper, NP-C [place]

## 2019-04-09 NOTE — Telephone Encounter (Signed)
Contacted infusion center with patient information- patient is aware and is waiting for her call.

## 2019-04-10 ENCOUNTER — Encounter (HOSPITAL_COMMUNITY): Payer: Self-pay

## 2019-04-10 ENCOUNTER — Ambulatory Visit (HOSPITAL_COMMUNITY)
Admission: RE | Admit: 2019-04-10 | Discharge: 2019-04-10 | Disposition: A | Discharge status: Home / Self Care | Payer: 59 | Source: Ambulatory Visit | Attending: Pulmonary Disease | Admitting: Pulmonary Disease

## 2019-04-10 DIAGNOSIS — I10 Essential (primary) hypertension: Secondary | ICD-10-CM | POA: Insufficient documentation

## 2019-04-10 DIAGNOSIS — Z23 Encounter for immunization: Secondary | ICD-10-CM | POA: Diagnosis not present

## 2019-04-10 DIAGNOSIS — U071 COVID-19: Secondary | ICD-10-CM | POA: Diagnosis present

## 2019-04-10 MED ORDER — SODIUM CHLORIDE 0.9 % IV SOLN
INTRAVENOUS | Status: DC | PRN
  Administered 2019-04-10: 250 mL via INTRAVENOUS

## 2019-04-10 MED ORDER — EPINEPHRINE 0.3 MG/0.3ML IJ SOAJ: 0.3000 mg | Freq: Once | INTRAMUSCULAR | Status: DC | PRN

## 2019-04-10 MED ORDER — DIPHENHYDRAMINE HCL 50 MG/ML IJ SOLN: 50.0000 mg | Freq: Once | INTRAMUSCULAR | Status: DC | PRN

## 2019-04-10 MED ORDER — SODIUM CHLORIDE 0.9 % IV SOLN
700.0000 mg | Freq: Once | INTRAVENOUS | Status: AC
  Administered 2019-04-10: 700 mg via INTRAVENOUS
  Filled 2019-04-10: qty 20

## 2019-04-10 MED ORDER — FAMOTIDINE IN NACL 20-0.9 MG/50ML-% IV SOLN: 20.0000 mg | Freq: Once | INTRAVENOUS | Status: DC | PRN

## 2019-04-10 MED ORDER — ALBUTEROL SULFATE HFA 108 (90 BASE) MCG/ACT IN AERS: 2.0000 | INHALATION_SPRAY | Freq: Once | RESPIRATORY_TRACT | Status: DC | PRN

## 2019-04-10 MED ORDER — METHYLPREDNISOLONE SODIUM SUCC 125 MG IJ SOLR: 125.0000 mg | Freq: Once | INTRAMUSCULAR | Status: DC | PRN

## 2019-04-10 NOTE — Discharge Instructions (Signed)

## 2019-04-10 NOTE — Progress Notes (Signed)
  Diagnosis: COVID-19  Physician: Dr. Joya Gaskins  Procedure: [facility] Infusion Clinic Med: bamlanivimab infusion - Provided patient with bamlanimivab fact sheet for patients, parents and caregivers prior to infusion.  Complications: No immediate complications noted.  Discharge: Discharged home   Lori Ortiz 04/10/2019

## 2019-04-12 ENCOUNTER — Telehealth: Payer: Self-pay

## 2019-04-12 NOTE — Telephone Encounter (Signed)
Louisa from [place]. HD called for [employer] number. Provided pt's work number and Passenger transport manager person Candis Musa. Silverio Lay verbalized understanding.

## 2019-06-20 ENCOUNTER — Other Ambulatory Visit: Payer: Self-pay | Admitting: *Deleted

## 2019-06-20 DIAGNOSIS — J301 Allergic rhinitis due to pollen: Secondary | ICD-10-CM

## 2019-06-20 MED ORDER — LORATADINE 10 MG PO TABS: 10.0000 mg | ORAL_TABLET | Freq: Every day | ORAL | 1 refills | Status: DC

## 2019-06-30 ENCOUNTER — Telehealth: Payer: Self-pay | Admitting: Nurse Practitioner

## 2019-07-01 NOTE — Telephone Encounter (Signed)
Appt made for Monday. °

## 2019-07-04 ENCOUNTER — Ambulatory Visit: Payer: PRIVATE HEALTH INSURANCE | Admitting: Nurse Practitioner

## 2019-07-04 ENCOUNTER — Other Ambulatory Visit: Payer: Self-pay

## 2019-07-04 ENCOUNTER — Encounter: Payer: Self-pay | Admitting: Nurse Practitioner

## 2019-07-04 VITALS — BP 126/59 | HR 50 | Temp 98.0°F | Resp 20 | Ht 69.0 in | Wt 265.0 lb

## 2019-07-04 DIAGNOSIS — Z09 Encounter for follow-up examination after completed treatment for conditions other than malignant neoplasm: Secondary | ICD-10-CM | POA: Diagnosis not present

## 2019-07-04 DIAGNOSIS — I509 Heart failure, unspecified: Secondary | ICD-10-CM

## 2019-07-04 DIAGNOSIS — I480 Paroxysmal atrial fibrillation: Secondary | ICD-10-CM

## 2019-07-04 MED ORDER — APIXABAN 5 MG PO TABS: 5.0000 mg | ORAL_TABLET | Freq: Two times a day (BID) | ORAL | 5 refills | Status: DC

## 2019-07-04 MED ORDER — HYDRALAZINE HCL 25 MG PO TABS: 25.0000 mg | ORAL_TABLET | Freq: Three times a day (TID) | ORAL | 5 refills | Status: DC

## 2019-07-04 MED ORDER — TORSEMIDE 20 MG PO TABS: 40.0000 mg | ORAL_TABLET | Freq: Every day | ORAL | 5 refills | Status: DC

## 2019-07-04 NOTE — Patient Instructions (Signed)

## 2019-07-04 NOTE — Progress Notes (Signed)
   Subjective:    Patient ID: Lori Ortiz, female    DOB: 01/15/1956, 64 y.o.   MRN: [id number]   Chief Complaint: Hospitalization Follow-up   HPI Patient comes in today for hospital follow up. She went to [facility] ER for SOB. They admitted her with heart failure and new onset atrial fib. She was given torsemide and was discharged home. Her weight came down 20lb wihile in hospital. They alo changed her metoprolol to 12.'5mg'$  BID and started her on eliquis '5mg'$  bID. He says she is no longer SOB and no edema that she has noticed. Has cardiology follow up on May 27,2021.   Mali score CHF-1 HTN-1 Age>70-0 Diabetes-0 Hx stroke (2)-2 Vascular disease-0 Age >65-1 Sex- female-1 TOTAL 6pts.     Review of Systems  Constitutional: Negative for diaphoresis.  Eyes: Negative for pain.  Respiratory: Negative for shortness of breath.   Cardiovascular: Negative for chest pain, palpitations and leg swelling.  Gastrointestinal: Negative for abdominal pain.  Endocrine: Negative for polydipsia.  Skin: Negative for rash.  Neurological: Negative for dizziness, weakness and headaches.  Hematological: Does not bruise/bleed easily.  All other systems reviewed and are negative.      Objective:   Physical Exam Vitals and nursing note reviewed.  Constitutional:      Appearance: Normal appearance.  Cardiovascular:     Rate and Rhythm: Normal rate. Rhythm irregular.     Pulses: Normal pulses.     Heart sounds: Normal heart sounds.  Pulmonary:     Effort: Pulmonary effort is normal.     Breath sounds: Normal breath sounds.  Skin:    General: Skin is warm.  Neurological:     General: No focal deficit present.     Mental Status: She is alert and oriented to person, place, and time.  Psychiatric:        Mood and Affect: Mood normal.        Behavior: Behavior normal.     Blood pressure (!) 126/59, pulse (!) 50, temperature 98 F (36.7 C), temperature source Temporal, resp. rate 20, height '5\' 9"'$   (1.753 m), weight 265 lb (120.2 kg), SpO2 96 %.  EKG-Atrial fib-Preliminary reading by Ronnald Collum, FNP  [facility]      Assessment & Plan:  Lori Ortiz in today with chief complaint of Hospitalization Follow-up   1. Acute congestive heart failure, unspecified heart failure type ([place]) Continue torsemide as rx - CMP14+EGFR - torsemide (DEMADEX) 20 MG tablet; Take 2 tablets (40 mg total) by mouth daily.  Dispense: 60 tablet; Refill: 5 - hydrALAZINE (APRESOLINE) 25 MG tablet; Take 1 tablet (25 mg total) by mouth 3 (three) times daily.  Dispense: 90 tablet; Refill: 5  2. Paroxysmal atrial fibrillation (HCC) Continue eliquis kep follow up with cardiology - EKG 12-Lead - apixaban (ELIQUIS) 5 MG TABS tablet; Take 1 tablet (5 mg total) by mouth 2 (two) times daily.  Dispense: 60 tablet; Refill: 5 - EKG 12-Lead  3. Hospital discharge follow-up hospital records brought in by patient were reviewed    The above assessment and management plan was discussed with the patient. The patient verbalized understanding of and has agreed to the management plan. Patient is aware to call the clinic if symptoms persist or worsen. Patient is aware when to return to the clinic for a follow-up visit. Patient educated on when it is appropriate to go to the emergency department.   Mary-Margaret Hassell Done, FNP

## 2019-07-05 LAB — CMP14+EGFR
ALT: 12 IU/L (ref 0–32)
AST: 9 IU/L (ref 0–40)
Albumin/Globulin Ratio: 1.5 (ref 1.2–2.2)
Albumin: 3.9 g/dL (ref 3.8–4.8)
Alkaline Phosphatase: 122 IU/L — ABNORMAL HIGH (ref 39–117)
BUN/Creatinine Ratio: 20 (ref 12–28)
BUN: 38 mg/dL — ABNORMAL HIGH (ref 8–27)
Bilirubin Total: 0.4 mg/dL (ref 0.0–1.2)
CO2: 25 mmol/L (ref 20–29)
Calcium: 9.6 mg/dL (ref 8.7–10.3)
Chloride: 102 mmol/L (ref 96–106)
Creatinine, Ser: 1.87 mg/dL — ABNORMAL HIGH (ref 0.57–1.00)
GFR calc Af Amer: 32 mL/min/{1.73_m2} — ABNORMAL LOW (ref >59)
GFR calc non Af Amer: 28 mL/min/{1.73_m2} — ABNORMAL LOW (ref >59)
Globulin, Total: 2.6 g/dL (ref 1.5–4.5)
Glucose: 103 mg/dL — ABNORMAL HIGH (ref 65–99)
Potassium: 4.2 mmol/L (ref 3.5–5.2)
Sodium: 143 mmol/L (ref 134–144)
Total Protein: 6.5 g/dL (ref 6.0–8.5)

## 2019-07-07 ENCOUNTER — Ambulatory Visit: Payer: PRIVATE HEALTH INSURANCE | Admitting: Nurse Practitioner

## 2019-07-21 DIAGNOSIS — I4821 Permanent atrial fibrillation: Secondary | ICD-10-CM | POA: Insufficient documentation

## 2019-07-26 ENCOUNTER — Ambulatory Visit: Payer: PRIVATE HEALTH INSURANCE | Admitting: Nurse Practitioner

## 2019-08-25 ENCOUNTER — Telehealth: Payer: Self-pay | Admitting: Nurse Practitioner

## 2019-08-25 NOTE — Telephone Encounter (Signed)
appt made for 09/06/19

## 2019-09-06 ENCOUNTER — Other Ambulatory Visit: Payer: Self-pay

## 2019-09-06 ENCOUNTER — Encounter: Payer: Self-pay | Admitting: Nurse Practitioner

## 2019-09-06 ENCOUNTER — Ambulatory Visit: Payer: PRIVATE HEALTH INSURANCE | Admitting: Nurse Practitioner

## 2019-09-06 VITALS — BP 160/86 | HR 51 | Temp 98.1°F | Resp 20 | Ht 69.0 in | Wt 267.0 lb

## 2019-09-06 DIAGNOSIS — R748 Abnormal levels of other serum enzymes: Secondary | ICD-10-CM | POA: Diagnosis not present

## 2019-09-06 DIAGNOSIS — Z09 Encounter for follow-up examination after completed treatment for conditions other than malignant neoplasm: Secondary | ICD-10-CM

## 2019-09-06 NOTE — Progress Notes (Signed)
   Subjective:    Patient ID: Lori Ortiz, female    DOB: 1955-09-16, 64 y.o.   MRN: [id number]   Chief Complaint: Hospitalization Follow-up   HPI Patient come sin today for hospital follow up. She was in hospital 08/24/19 for left atrial appendage closure. They actually put a watchman in to prevent clots from going to her heart. She stayed over night then was discharged. She is feeling  Fine. thye had some concerned abiut her kidney function while she wasin the hospital. Lab Results  Component Value Date   CREATININE 1.87 (H) 07/04/2019   BUN 38 (H) 07/04/2019   NA 143 07/04/2019   K 4.2 07/04/2019   CL 102 07/04/2019   CO2 25 07/04/2019      Review of Systems  Constitutional: Negative for diaphoresis.  Eyes: Negative for pain.  Respiratory: Negative for shortness of breath.   Cardiovascular: Negative for chest pain, palpitations and leg swelling.  Gastrointestinal: Negative for abdominal pain.  Endocrine: Negative for polydipsia.  Skin: Negative for rash.  Neurological: Negative for dizziness, weakness and headaches.  Hematological: Does not bruise/bleed easily.  All other systems reviewed and are negative.      Objective:   Physical Exam Vitals and nursing note reviewed.  Constitutional:      Appearance: Normal appearance.  Cardiovascular:     Rate and Rhythm: Normal rate and regular rhythm.     Heart sounds: Murmur (2/6 systolic) heard.   Pulmonary:     Breath sounds: Normal breath sounds.  Skin:    General: Skin is warm.  Neurological:     General: No focal deficit present.     Mental Status: She is alert and oriented to person, place, and time.  Psychiatric:        Mood and Affect: Mood normal.        Behavior: Behavior normal.    BP (!) 160/86   Pulse (!) 51   Temp 98.1 F (36.7 C) (Temporal)   Resp 20   Ht _0  (1.753 m)   Wt 267 lb (121.1 kg)   SpO2 94%   BMI 39.43 kg/m   BP Readings from Last 3 Encounters:  09/06/19 (!) 160/86  07/04/19  (!) 126/59  04/10/19 (!) 109/54         Assessment & Plan:  Lori Ortiz in today with chief complaint of Hospitalization Follow-up   1. Elevated creatine kinase Labs pending  2. Hospital discharge follow-up Hospital records reviewed   Orders Placed This Encounter  Procedures  . BMP8+EGFR     The above assessment and management plan was discussed with the patient. The patient verbalized understanding of and has agreed to the management plan. Patient is aware to call the clinic if symptoms persist or worsen. Patient is aware when to return to the clinic for a follow-up visit. Patient educated on when it is appropriate to go to the emergency department.   Mary-Margaret Hassell Done, FNP

## 2019-10-02 ENCOUNTER — Other Ambulatory Visit: Payer: Self-pay | Admitting: Nurse Practitioner

## 2019-10-02 DIAGNOSIS — E782 Mixed hyperlipidemia: Secondary | ICD-10-CM

## 2019-10-04 DIAGNOSIS — E785 Hyperlipidemia, unspecified: Secondary | ICD-10-CM | POA: Insufficient documentation

## 2019-10-04 DIAGNOSIS — Z8673 Personal history of transient ischemic attack (TIA), and cerebral infarction without residual deficits: Secondary | ICD-10-CM | POA: Insufficient documentation

## 2019-10-06 ENCOUNTER — Ambulatory Visit: Payer: PRIVATE HEALTH INSURANCE | Admitting: Nurse Practitioner

## 2019-10-06 ENCOUNTER — Encounter: Payer: Self-pay | Admitting: Nurse Practitioner

## 2019-10-06 ENCOUNTER — Other Ambulatory Visit: Payer: Self-pay

## 2019-10-06 VITALS — BP 152/80 | HR 63 | Temp 98.2°F | Resp 20 | Ht 69.0 in | Wt 263.0 lb

## 2019-10-06 DIAGNOSIS — E782 Mixed hyperlipidemia: Secondary | ICD-10-CM

## 2019-10-06 DIAGNOSIS — I10 Essential (primary) hypertension: Secondary | ICD-10-CM | POA: Diagnosis not present

## 2019-10-06 DIAGNOSIS — K219 Gastro-esophageal reflux disease without esophagitis: Secondary | ICD-10-CM

## 2019-10-06 DIAGNOSIS — M8588 Other specified disorders of bone density and structure, other site: Secondary | ICD-10-CM

## 2019-10-06 DIAGNOSIS — I509 Heart failure, unspecified: Secondary | ICD-10-CM

## 2019-10-06 LAB — CMP14+EGFR
ALT: 14 IU/L (ref 0–32)
AST: 12 IU/L (ref 0–40)
Albumin/Globulin Ratio: 1.6 (ref 1.2–2.2)
Albumin: 3.9 g/dL (ref 3.8–4.8)
Alkaline Phosphatase: 128 IU/L — ABNORMAL HIGH (ref 48–121)
BUN/Creatinine Ratio: 16 (ref 12–28)
BUN: 21 mg/dL (ref 8–27)
Bilirubin Total: 0.4 mg/dL (ref 0.0–1.2)
CO2: 25 mmol/L (ref 20–29)
Calcium: 9.5 mg/dL (ref 8.7–10.3)
Chloride: 106 mmol/L (ref 96–106)
Creatinine, Ser: 1.35 mg/dL — ABNORMAL HIGH (ref 0.57–1.00)
GFR calc Af Amer: 48 mL/min/{1.73_m2} — ABNORMAL LOW (ref >59)
GFR calc non Af Amer: 41 mL/min/{1.73_m2} — ABNORMAL LOW (ref >59)
Globulin, Total: 2.4 g/dL (ref 1.5–4.5)
Glucose: 94 mg/dL (ref 65–99)
Potassium: 4.7 mmol/L (ref 3.5–5.2)
Sodium: 144 mmol/L (ref 134–144)
Total Protein: 6.3 g/dL (ref 6.0–8.5)

## 2019-10-06 LAB — CBC WITH DIFFERENTIAL/PLATELET
Basophils Absolute: 0.1 10*3/uL (ref 0.0–0.2)
Basos: 1 %
EOS (ABSOLUTE): 0.2 10*3/uL (ref 0.0–0.4)
Eos: 3 %
Hematocrit: 35.3 % (ref 34.0–46.6)
Hemoglobin: 11.5 g/dL (ref 11.1–15.9)
Immature Grans (Abs): 0 10*3/uL (ref 0.0–0.1)
Immature Granulocytes: 0 %
Lymphocytes Absolute: 2 10*3/uL (ref 0.7–3.1)
Lymphs: 26 %
MCH: 29.4 pg (ref 26.6–33.0)
MCHC: 32.6 g/dL (ref 31.5–35.7)
MCV: 90 fL (ref 79–97)
Monocytes Absolute: 0.4 10*3/uL (ref 0.1–0.9)
Monocytes: 5 %
Neutrophils Absolute: 5.2 10*3/uL (ref 1.4–7.0)
Neutrophils: 65 %
Platelets: 294 10*3/uL (ref 150–450)
RBC: 3.91 x10E6/uL (ref 3.77–5.28)
RDW: 13.1 % (ref 11.7–15.4)
WBC: 7.9 10*3/uL (ref 3.4–10.8)

## 2019-10-06 LAB — LIPID PANEL
Chol/HDL Ratio: 3.3 ratio (ref 0.0–4.4)
Cholesterol, Total: 110 mg/dL (ref 100–199)
HDL: 33 mg/dL — ABNORMAL LOW (ref >39)
LDL Chol Calc (NIH): 59 mg/dL (ref 0–99)
Triglycerides: 92 mg/dL (ref 0–149)
VLDL Cholesterol Cal: 18 mg/dL (ref 5–40)

## 2019-10-06 MED ORDER — LISINOPRIL 20 MG PO TABS: 20.0000 mg | ORAL_TABLET | Freq: Every day | ORAL | 1 refills | Status: DC

## 2019-10-06 MED ORDER — AMLODIPINE BESYLATE 10 MG PO TABS: 10.0000 mg | ORAL_TABLET | Freq: Every day | ORAL | 1 refills | Status: DC

## 2019-10-06 MED ORDER — PANTOPRAZOLE SODIUM 40 MG PO TBEC: 40.0000 mg | DELAYED_RELEASE_TABLET | Freq: Every day | ORAL | 1 refills | Status: DC

## 2019-10-06 NOTE — Progress Notes (Signed)
Subjective:    Patient ID: Lori Ortiz, female    DOB: Jul 17, 1955, 64 y.o.   MRN: [id number]   Chief Complaint: Medical Management of Chronic Issues    HPI:  1. Essential hypertension No c/o chest pain, sob or headache. Does not check blood pressure at home. BP Readings from Last 3 Encounters:  10/06/19 (!) 159/74  09/06/19 (!) 160/86  07/04/19 (!) 126/59     2. Mixed hyperlipidemia Does not watch diet and does very little exercise. Lab Results  Component Value Date   CHOL 150 03/07/2019   HDL 37 (L) 03/07/2019   LDLCALC 96 03/07/2019   TRIG 89 03/07/2019   CHOLHDL 4.1 03/07/2019   The 10-year ASCVD risk score Mikey Bussing DC Jr., et al., 2013) is: 20.4%   Values used to calculate the score:     Age: 76 years     Sex: Female     Is Non-Hispanic African American: No     Diabetic: No     Tobacco smoker: Yes     Systolic Blood Pressure: 335 mmHg     Is BP treated: Yes     HDL Cholesterol: 37 mg/dL     Total Cholesterol: 150 mg/dL   3. Gastroesophageal reflux disease, unspecified whether esophagitis present Is on protonix daly and is working well.  4. Osteopenia of lumbar spine Does not do any weight bearing exercise. Last dexascan was done on 03/05/18. tscore was -1.9. is on vitamin d supplement daily  5. Morbid obesity ([place]) Weight is down 4lbs since  Last visit Wt Readings from Last 3 Encounters:  10/06/19 263 lb (119.3 kg)  09/06/19 267 lb (121.1 kg)  07/04/19 265 lb (120.2 kg)   BMI Readings from Last 3 Encounters:  10/06/19 38.84 kg/m  09/06/19 39.43 kg/m  07/04/19 39.13 kg/m       Outpatient Encounter Medications as of 10/06/2019  Medication Sig  . amLODipine (NORVASC) 10 MG tablet Take 1 tablet (10 mg total) by mouth daily.  Marland Kitchen apixaban (ELIQUIS) 5 MG TABS tablet Take 1 tablet (5 mg total) by mouth 2 (two) times daily.  Marland Kitchen atorvastatin (LIPITOR) 40 MG tablet TAKE 1 TABLET BY MOUTH EVERY DAY  . hydrALAZINE (APRESOLINE) 25 MG tablet Take 1 tablet (25 mg  total) by mouth 3 (three) times daily.  Marland Kitchen loratadine (CLARITIN) 10 MG tablet Take 1 tablet (10 mg total) by mouth daily.  . pantoprazole (PROTONIX) 40 MG tablet Take 1 tablet (40 mg total) by mouth daily. (Needs to be seen)  . torsemide (DEMADEX) 20 MG tablet Take 2 tablets (40 mg total) by mouth daily.  Marland Kitchen VITAMIN D PO Take by mouth.  History reviewed. No pertinent surgical history.  Family History  Problem Relation Age of Onset  . Heart disease Mother   . Diabetes Mother   . Cancer Mother   . Aneurysm Father   . Hypertension Father   . Hypertension Brother     New complaints: None today  Social history: Lives with husband-  Controlled substance contract: n/a    Review of Systems  Constitutional: Negative for diaphoresis.  HENT: Positive for congestion.   Eyes: Negative for pain.  Respiratory: Positive for cough. Negative for shortness of breath.   Cardiovascular: Negative for chest pain, palpitations and leg swelling.  Gastrointestinal: Negative for abdominal pain.  Endocrine: Negative for polydipsia.  Skin: Negative for rash.  Neurological: Negative for dizziness, weakness and headaches.  Hematological: Does not bruise/bleed easily.  All other systems reviewed  and are negative.      Objective:   Physical Exam Vitals and nursing note reviewed.  Constitutional:      General: She is not in acute distress.    Appearance: Normal appearance. She is well-developed.  HENT:     Head: Normocephalic.     Nose: Nose normal.  Eyes:     Pupils: Pupils are equal, round, and reactive to light.  Neck:     Vascular: No carotid bruit or JVD.  Cardiovascular:     Rate and Rhythm: Normal rate and regular rhythm.     Heart sounds: Murmur (2/6 systolis) heard.      Comments: Ropey varicosities bil lower ext. Pulmonary:     Effort: Pulmonary effort is normal. No respiratory distress.     Breath sounds: Normal breath sounds. No wheezing or rales.  Chest:     Chest wall: No  tenderness.  Abdominal:     General: Bowel sounds are normal. There is no distension or abdominal bruit.     Palpations: Abdomen is soft. There is no hepatomegaly, splenomegaly, mass or pulsatile mass.     Tenderness: There is no abdominal tenderness.  Musculoskeletal:        General: Normal range of motion.     Cervical back: Normal range of motion and neck supple.     Right lower leg: Edema (1+) present.     Left lower leg: Edema (1+) present.  Lymphadenopathy:     Cervical: No cervical adenopathy.  Skin:    General: Skin is warm and dry.     Findings: Bruising (varying in size and location all over body.) present.  Neurological:     Mental Status: She is alert and oriented to person, place, and time.     Deep Tendon Reflexes: Reflexes are normal and symmetric.  Psychiatric:        Behavior: Behavior normal.        Thought Content: Thought content normal.        Judgment: Judgment normal.    BP (!) 152/80 (BP Location: Left Arm, Cuff Size: Large)   Pulse 63   Temp 98.2 F (36.8 C) (Temporal)   Resp 20   Ht '5\' 9"'  (1.753 m)   Wt 263 lb (119.3 kg)   SpO2 97%   BMI 38.84 kg/m          Assessment & Plan:  Lori Ortiz comes in today with chief complaint of Medical Management of Chronic Issues   Diagnosis and orders addressed:  1. Essential hypertension Low sodium diet - lisinopril (ZESTRIL) 20 MG tablet; Take 1 tablet (20 mg total) by mouth daily.  Dispense: 90 tablet; Refill: 1 - amLODipine (NORVASC) 10 MG tablet; Take 1 tablet (10 mg total) by mouth daily.  Dispense: 90 tablet; Refill: 1 - CBC with Differential/Platelet - CMP14+EGFR  2. Mixed hyperlipidemia Low fat diet - Lipid panel  3. Gastroesophageal reflux disease, unspecified whether esophagitis present Avoid spicy foods Do not eat 2 hours prior to bedtime  4. Osteopenia of lumbar spine Weight bearing exercise  5. Morbid obesity ([place]) Discussed diet and exercise for person with BMI >25 Will  recheck weight in 3-6 months  6. Acute congestive heart failure, unspecified heart failure type [facility]) Keep follow up with cardiology  7. Gastroesophageal reflux disease Avoid spicy foods Do not eat 2 hours prior to bedtime - pantoprazole (PROTONIX) 40 MG tablet; Take 1 tablet (40 mg total) by mouth daily. (Needs to be seen)  Dispense:  90 tablet; Refill: 1   Labs pending Health Maintenance reviewed Diet and exercise encouraged  Follow up plan: 6 months   Mary-Margaret Hassell Done, FNP

## 2019-10-06 NOTE — Patient Instructions (Signed)
DASH Eating Plan DASH stands for "Dietary Approaches to Stop Hypertension." The DASH eating plan is a healthy eating plan that has been shown to reduce high blood pressure (hypertension). It may also reduce your risk for type 2 diabetes, heart disease, and stroke. The DASH eating plan may also help with weight loss. What are tips for following this plan?  General guidelines  Avoid eating more than 2,300 mg (milligrams) of salt (sodium) a day. If you have hypertension, you may need to reduce your sodium intake to 1,500 mg a day.  Limit alcohol intake to no more than 1 drink a day for nonpregnant women and 2 drinks a day for men. One drink equals 12 oz of beer, 5 oz of wine, or 1 oz of hard liquor.  Work with your health care provider to maintain a healthy body weight or to lose weight. Ask what an ideal weight is for you.  Get at least 30 minutes of exercise that causes your heart to beat faster (aerobic exercise) most days of the week. Activities may include walking, swimming, or biking.  Work with your health care provider or diet and nutrition specialist (dietitian) to adjust your eating plan to your individual calorie needs. Reading food labels   Check food labels for the amount of sodium per serving. Choose foods with less than 5 percent of the Daily Value of sodium. Generally, foods with less than 300 mg of sodium per serving fit into this eating plan.  To find whole grains, look for the word "whole" as the first word in the ingredient list. Shopping  Buy products labeled as "low-sodium" or "no salt added."  Buy fresh foods. Avoid canned foods and premade or frozen meals. Cooking  Avoid adding salt when cooking. Use salt-free seasonings or herbs instead of table salt or sea salt. Check with your health care provider or pharmacist before using salt substitutes.  Do not fry foods. Cook foods using healthy methods such as baking, boiling, grilling, and broiling instead.  Cook with  heart-healthy oils, such as olive, canola, soybean, or sunflower oil. Meal planning  Eat a balanced diet that includes: ? 5 or more servings of fruits and vegetables each day. At each meal, try to fill half of your plate with fruits and vegetables. ? Up to 6-8 servings of whole grains each day. ? Less than 6 oz of lean meat, poultry, or fish each day. A 3-oz serving of meat is about the same size as a deck of cards. One egg equals 1 oz. ? 2 servings of low-fat dairy each day. ? A serving of nuts, seeds, or beans 5 times each week. ? Heart-healthy fats. Healthy fats called Omega-3 fatty acids are found in foods such as flaxseeds and coldwater fish, like sardines, salmon, and mackerel.  Limit how much you eat of the following: ? Canned or prepackaged foods. ? Food that is high in trans fat, such as fried foods. ? Food that is high in saturated fat, such as fatty meat. ? Sweets, desserts, sugary drinks, and other foods with added sugar. ? Full-fat dairy products.  Do not salt foods before eating.  Try to eat at least 2 vegetarian meals each week.  Eat more home-cooked food and less restaurant, buffet, and fast food.  When eating at a restaurant, ask that your food be prepared with less salt or no salt, if possible. What foods are recommended? The items listed may not be a complete list. Talk with your dietitian about   what dietary choices are best for you. Grains Whole-grain or whole-wheat bread. Whole-grain or whole-wheat pasta. Brown rice. Oatmeal. Quinoa. Bulgur. Whole-grain and low-sodium cereals. Pita bread. Low-fat, low-sodium crackers. Whole-wheat flour tortillas. Vegetables Fresh or frozen vegetables (raw, steamed, roasted, or grilled). Low-sodium or reduced-sodium tomato and vegetable juice. Low-sodium or reduced-sodium tomato sauce and tomato paste. Low-sodium or reduced-sodium canned vegetables. Fruits All fresh, dried, or frozen fruit. Canned fruit in natural juice (without  added sugar). Meat and other protein foods Skinless chicken or turkey. Ground chicken or turkey. Pork with fat trimmed off. Fish and seafood. Egg whites. Dried beans, peas, or lentils. Unsalted nuts, nut butters, and seeds. Unsalted canned beans. Lean cuts of beef with fat trimmed off. Low-sodium, lean deli meat. Dairy Low-fat (1%) or fat-free (skim) milk. Fat-free, low-fat, or reduced-fat cheeses. Nonfat, low-sodium ricotta or cottage cheese. Low-fat or nonfat yogurt. Low-fat, low-sodium cheese. Fats and oils Soft margarine without trans fats. Vegetable oil. Low-fat, reduced-fat, or light mayonnaise and salad dressings (reduced-sodium). Canola, safflower, olive, soybean, and sunflower oils. Avocado. Seasoning and other foods Herbs. Spices. Seasoning mixes without salt. Unsalted popcorn and pretzels. Fat-free sweets. What foods are not recommended? The items listed may not be a complete list. Talk with your dietitian about what dietary choices are best for you. Grains Baked goods made with fat, such as croissants, muffins, or some breads. Dry pasta or rice meal packs. Vegetables Creamed or fried vegetables. Vegetables in a cheese sauce. Regular canned vegetables (not low-sodium or reduced-sodium). Regular canned tomato sauce and paste (not low-sodium or reduced-sodium). Regular tomato and vegetable juice (not low-sodium or reduced-sodium). Pickles. Olives. Fruits Canned fruit in a light or heavy syrup. Fried fruit. Fruit in cream or butter sauce. Meat and other protein foods Fatty cuts of meat. Ribs. Fried meat. Bacon. Sausage. Bologna and other processed lunch meats. Salami. Fatback. Hotdogs. Bratwurst. Salted nuts and seeds. Canned beans with added salt. Canned or smoked fish. Whole eggs or egg yolks. Chicken or turkey with skin. Dairy Whole or 2% milk, cream, and half-and-half. Whole or full-fat cream cheese. Whole-fat or sweetened yogurt. Full-fat cheese. Nondairy creamers. Whipped toppings.  Processed cheese and cheese spreads. Fats and oils Butter. Stick margarine. Lard. Shortening. Ghee. Bacon fat. Tropical oils, such as coconut, palm kernel, or palm oil. Seasoning and other foods Salted popcorn and pretzels. Onion salt, garlic salt, seasoned salt, table salt, and sea salt. Worcestershire sauce. Tartar sauce. Barbecue sauce. Teriyaki sauce. Soy sauce, including reduced-sodium. Steak sauce. Canned and packaged gravies. Fish sauce. Oyster sauce. Cocktail sauce. Horseradish that you find on the shelf. Ketchup. Mustard. Meat flavorings and tenderizers. Bouillon cubes. Hot sauce and Tabasco sauce. Premade or packaged marinades. Premade or packaged taco seasonings. Relishes. Regular salad dressings. Where to find more information:  National Heart, Lung, and Blood Institute: www.nhlbi.nih.gov  American Heart Association: www.heart.org Summary  The DASH eating plan is a healthy eating plan that has been shown to reduce high blood pressure (hypertension). It may also reduce your risk for type 2 diabetes, heart disease, and stroke.  With the DASH eating plan, you should limit salt (sodium) intake to 2,300 mg a day. If you have hypertension, you may need to reduce your sodium intake to 1,500 mg a day.  When on the DASH eating plan, aim to eat more fresh fruits and vegetables, whole grains, lean proteins, low-fat dairy, and heart-healthy fats.  Work with your health care provider or diet and nutrition specialist (dietitian) to adjust your eating plan to your   individual calorie needs. This information is not intended to replace advice given to you by your health care provider. Make sure you discuss any questions you have with your health care provider. Document Revised: 01/23/2017 Document Reviewed: 02/04/2016 Elsevier Patient Education  2020 Else[employer] Inc.  

## 2020-03-10 ENCOUNTER — Other Ambulatory Visit: Payer: Self-pay | Admitting: Nurse Practitioner

## 2020-03-10 DIAGNOSIS — J301 Allergic rhinitis due to pollen: Secondary | ICD-10-CM

## 2020-04-09 ENCOUNTER — Ambulatory Visit: Payer: Self-pay | Admitting: Nurse Practitioner

## 2020-06-25 ENCOUNTER — Encounter: Payer: Self-pay | Admitting: Nurse Practitioner

## 2020-06-25 ENCOUNTER — Ambulatory Visit (INDEPENDENT_AMBULATORY_CARE_PROVIDER_SITE_OTHER): Payer: PRIVATE HEALTH INSURANCE | Admitting: Nurse Practitioner

## 2020-06-25 DIAGNOSIS — U071 COVID-19: Secondary | ICD-10-CM | POA: Diagnosis not present

## 2020-06-25 MED ORDER — NIRMATRELVIR/RITONAVIR (PAXLOVID)TABLET: ORAL_TABLET | ORAL | 0 refills | Status: DC

## 2020-06-25 NOTE — Progress Notes (Signed)
Virtual Visit  Note Due to COVID-19 pandemic this visit was conducted virtually. This visit type was conducted due to national recommendations for restrictions regarding the COVID-19 Pandemic (e.g. social distancing, sheltering in place) in an effort to limit this patient's exposure and mitigate transmission in our community. All issues noted in this document were discussed and addressed.  A physical exam was not performed with this format.  I connected with Lori Ortiz on 06/25/20 at *10:58** by telephone and verified that I am speaking with the correct person using two identifiers. Lori Ortiz is currently located at home and her husband is currently with her during visit. The provider, Mary-Margaret Hassell Done, FNP is located in their office at time of visit.  I discussed the limitations, risks, security and privacy concerns of performing an evaluation and management service by telephone and the availability of in person appointments. I also discussed with the patient that there may be a patient responsible charge related to this service. The patient expressed understanding and agreed to proceed.   History and Present Illness:   Chief Complaint: Covid Positive   HPI patient says she tested positive for covid yesterday. She has cough, congestion, runny nose , sore throat and headache. This is the second time she has had covid in the last year.   Review of Systems  Constitutional: Negative for chills and fever.  HENT: Positive for congestion, ear pain and sore throat.   Respiratory: Positive for cough and sputum production.   Musculoskeletal: Positive for myalgias.  Neurological: Positive for headaches.     Observations/Objective: Alert and oriented- answers all questions appropriately No distress Voice hoarse Cough noted   Assessment and Plan: Lori Ortiz in today with chief complaint of Covid Positive   1. Lab test positive for detection of COVID-19 virus 1. Take meds as  prescribed 2. Use a cool mist humidifier especially during the winter months and when heat has been humid. 3. Use saline nose sprays frequently 4. Saline irrigations of the nose can be very helpful if done frequently.  * 4X daily for 1 week*  * Use of a nettie pot can be helpful with this. Follow directions with this* 5. Drink plenty of fluids 6. Keep thermostat turn down low 7.For any cough or congestion  Use plain Mucinex- regular strength or max strength is fine   * Children- consult with Pharmacist for dosing 8. For fever or aces or pains- take tylenol or ibuprofen appropriate for age and weight.  * for fevers greater than 101 orally you may alternate ibuprofen and tylenol every  3 hours.   Quarantine for 10 days  Meds ordered this encounter  Medications  . nirmatrelvir/ritonavir EUA (PAXLOVID) TABS    Sig: Patient GFR is 1.35. Take nirmatrelvir (150 mg) 2 tablet(s) twice daily for 5 days and ritonavir (100 mg) one tablet twice daily for 5 days.    Dispense:  30 tablet    Refill:  0    Order Specific Question:   Supervising Provider    Answer:   Caryl Pina A [[id number]]       Follow Up Instructions: .prn    I discussed the assessment and treatment plan with the patient. The patient was provided an opportunity to ask questions and all were answered. The patient agreed with the plan and demonstrated an understanding of the instructions.   The patient was advised to call back or seek an in-person evaluation if the symptoms worsen or if the condition fails to improve  as anticipated.  The above assessment and management plan was discussed with the patient. The patient verbalized understanding of and has agreed to the management plan. Patient is aware to call the clinic if symptoms persist or worsen. Patient is aware when to return to the clinic for a follow-up visit. Patient educated on when it is appropriate to go to the emergency department.   Time call ended:  11:10  I  provided 12 minutes of  non face-to-face time during this encounter.    Mary-Margaret Hassell Done, FNP

## 2020-07-10 DIAGNOSIS — I350 Nonrheumatic aortic (valve) stenosis: Secondary | ICD-10-CM | POA: Insufficient documentation

## 2020-09-02 ENCOUNTER — Other Ambulatory Visit: Payer: Self-pay | Admitting: Nurse Practitioner

## 2020-09-02 DIAGNOSIS — J301 Allergic rhinitis due to pollen: Secondary | ICD-10-CM

## 2020-09-02 DIAGNOSIS — K219 Gastro-esophageal reflux disease without esophagitis: Secondary | ICD-10-CM

## 2020-09-17 ENCOUNTER — Other Ambulatory Visit: Payer: Self-pay | Admitting: Nurse Practitioner

## 2020-09-17 DIAGNOSIS — K219 Gastro-esophageal reflux disease without esophagitis: Secondary | ICD-10-CM

## 2020-11-21 ENCOUNTER — Other Ambulatory Visit: Payer: Self-pay | Admitting: Nurse Practitioner

## 2020-11-21 DIAGNOSIS — I509 Heart failure, unspecified: Secondary | ICD-10-CM

## 2020-12-17 ENCOUNTER — Other Ambulatory Visit: Payer: Self-pay | Admitting: Nurse Practitioner

## 2020-12-17 DIAGNOSIS — K219 Gastro-esophageal reflux disease without esophagitis: Secondary | ICD-10-CM

## 2020-12-17 NOTE — Telephone Encounter (Signed)
MMM NTBS 30 days given 11/25/20

## 2021-01-22 ENCOUNTER — Other Ambulatory Visit: Payer: Self-pay

## 2021-01-22 ENCOUNTER — Encounter: Payer: Self-pay | Admitting: Nurse Practitioner

## 2021-01-22 ENCOUNTER — Ambulatory Visit (INDEPENDENT_AMBULATORY_CARE_PROVIDER_SITE_OTHER): Payer: PRIVATE HEALTH INSURANCE

## 2021-01-22 ENCOUNTER — Ambulatory Visit: Payer: PRIVATE HEALTH INSURANCE | Admitting: Nurse Practitioner

## 2021-01-22 VITALS — BP 142/87 | HR 50 | Temp 97.7°F | Resp 20 | Ht 69.0 in | Wt 236.0 lb

## 2021-01-22 DIAGNOSIS — M8588 Other specified disorders of bone density and structure, other site: Secondary | ICD-10-CM

## 2021-01-22 DIAGNOSIS — I509 Heart failure, unspecified: Secondary | ICD-10-CM

## 2021-01-22 DIAGNOSIS — E782 Mixed hyperlipidemia: Secondary | ICD-10-CM

## 2021-01-22 DIAGNOSIS — I10 Essential (primary) hypertension: Secondary | ICD-10-CM

## 2021-01-22 DIAGNOSIS — Z6834 Body mass index (BMI) 34.0-34.9, adult: Secondary | ICD-10-CM

## 2021-01-22 DIAGNOSIS — K219 Gastro-esophageal reflux disease without esophagitis: Secondary | ICD-10-CM | POA: Diagnosis not present

## 2021-01-22 MED ORDER — PANTOPRAZOLE SODIUM 40 MG PO TBEC: 40.0000 mg | DELAYED_RELEASE_TABLET | Freq: Every day | ORAL | 1 refills | Status: DC

## 2021-01-22 MED ORDER — HYDRALAZINE HCL 25 MG PO TABS: 25.0000 mg | ORAL_TABLET | Freq: Three times a day (TID) | ORAL | 5 refills | Status: DC

## 2021-01-22 MED ORDER — TORSEMIDE 20 MG PO TABS: 40.0000 mg | ORAL_TABLET | Freq: Every day | ORAL | 1 refills | Status: DC

## 2021-01-22 NOTE — Progress Notes (Addendum)
Subjective:    Patient ID: Lori Ortiz, female    DOB: 1955-04-18, 65 y.o.   MRN: [id number]  Chief Complaint: Medical Management of Chronic Issues    HPI:  1. Primary hypertension No c/o chest pain, sob or headache. Does not check her blood pressure at home. BP Readings from Last 3 Encounters:  01/22/21 (!) 156/63  10/06/19 (!) 152/80  09/06/19 (!) 160/86     2. Mixed hyperlipidemia She tries to watch fats in her diet. Does no dedicated exercise. Lab Results  Component Value Date   CHOL 110 10/06/2019   HDL 33 (L) 10/06/2019   LDLCALC 59 10/06/2019   TRIG 92 10/06/2019   CHOLHDL 3.3 10/06/2019     3. Gastroesophageal reflux disease, unspecified whether esophagitis present Is on protonix daily and is doing well.  4. Osteopenia of lumbar spine Last dexascan was done 03/05/18. Her t score was -1.9.   5. Morbid obesity ([place]) Weight is down 24lbs Wt Readings from Last 3 Encounters:  01/22/21 236 lb (107 kg)  10/06/19 263 lb (119.3 kg)  09/06/19 267 lb (121.1 kg)   BMI Readings from Last 3 Encounters:  01/22/21 34.85 kg/m  10/06/19 38.84 kg/m  09/06/19 39.43 kg/m       Outpatient Encounter Medications as of 01/22/2021  Medication Sig   hydrALAZINE (APRESOLINE) 25 MG tablet Take 1 tablet (25 mg total) by mouth 3 (three) times daily.   loratadine (CLARITIN) 10 MG tablet Take 1 tablet (10 mg total) by mouth daily.   pantoprazole (PROTONIX) 40 MG tablet Take 1 tablet (40 mg total) by mouth daily. (NEEDS TO BE SEEN BEFORE NEXT REFILL)   torsemide (DEMADEX) 20 MG tablet Take 2 tablets (40 mg total) by mouth daily.   VITAMIN D PO Take by mouth.   [DISCONTINUED] amLODipine (NORVASC) 10 MG tablet Take 1 tablet (10 mg total) by mouth daily.   [DISCONTINUED] apixaban (ELIQUIS) 5 MG TABS tablet Take 1 tablet (5 mg total) by mouth 2 (two) times daily.   [DISCONTINUED] atorvastatin (LIPITOR) 40 MG tablet TAKE 1 TABLET BY MOUTH EVERY DAY   [DISCONTINUED] lisinopril  (ZESTRIL) 20 MG tablet Take 1 tablet (20 mg total) by mouth daily.   [DISCONTINUED] nirmatrelvir/ritonavir EUA (PAXLOVID) TABS Patient GFR is 1.35. Take nirmatrelvir (150 mg) 2 tablet(s) twice daily for 5 days and ritonavir (100 mg) one tablet twice daily for 5 days.   No facility-administered encounter medications on file as of 01/22/2021.    History reviewed. No pertinent surgical history.  Family History  Problem Relation Age of Onset   Heart disease Mother    Diabetes Mother    Cancer Mother    Aneurysm Father    Hypertension Father    Hypertension Brother     New complaints: None today  Social history: Lives her friend  Controlled substance contract: n/a     Review of Systems  Constitutional:  Negative for diaphoresis.  Eyes:  Negative for pain.  Respiratory:  Negative for shortness of breath.   Cardiovascular:  Negative for chest pain, palpitations and leg swelling.  Gastrointestinal:  Negative for abdominal pain.  Endocrine: Negative for polydipsia.  Skin:  Negative for rash.  Neurological:  Negative for dizziness, weakness and headaches.  Hematological:  Does not bruise/bleed easily.  All other systems reviewed and are negative.     Objective:   Physical Exam Vitals and nursing note reviewed.  Constitutional:      General: She is not in acute distress.  Appearance: Normal appearance. She is well-developed.  HENT:     Head: Normocephalic.     Right Ear: Tympanic membrane normal.     Left Ear: Tympanic membrane normal.     Nose: Nose normal.     Mouth/Throat:     Mouth: Mucous membranes are moist.  Eyes:     Pupils: Pupils are equal, round, and reactive to light.  Neck:     Vascular: No carotid bruit or JVD.  Cardiovascular:     Rate and Rhythm: Normal rate and regular rhythm.     Heart sounds: Murmur (2/6) heard.  Pulmonary:     Effort: Pulmonary effort is normal. No respiratory distress.     Breath sounds: Normal breath sounds. No wheezing or  rales.  Chest:     Chest wall: No tenderness.  Abdominal:     General: Bowel sounds are normal. There is no distension or abdominal bruit.     Palpations: Abdomen is soft. There is no hepatomegaly, splenomegaly, mass or pulsatile mass.     Tenderness: There is no abdominal tenderness.  Musculoskeletal:        General: Normal range of motion.     Cervical back: Normal range of motion and neck supple.     Right lower leg: No edema.     Left lower leg: No edema.  Lymphadenopathy:     Cervical: No cervical adenopathy.  Skin:    General: Skin is warm and dry.  Neurological:     Mental Status: She is alert and oriented to person, place, and time.     Deep Tendon Reflexes: Reflexes are normal and symmetric.  Psychiatric:        Behavior: Behavior normal.        Thought Content: Thought content normal.        Judgment: Judgment normal.    BP (!) 142/87   Pulse (!) 50   Temp 97.7 F (36.5 C) (Temporal)   Resp 20   Ht 5' 9" (1.753 m)   Wt 236 lb (107 kg)   SpO2 98%   BMI 34.85 kg/m         Assessment & Plan:  Lori Ortiz comes in today with chief complaint of Medical Management of Chronic Issues   Diagnosis and orders addressed:  1. Primary hypertension Low sodium diet - CBC with Differential/Platelet - CMP14+EGFR  2. Mixed hyperlipidemia Low fat diet - Lipid panel  3. Gastroesophageal reflux disease, unspecified whether esophagitis present Avoid spicy foods Do not eat 2 hours prior to bedtime  4. Osteopenia of lumbar spine Weight bearing exercises - DG [facility] DEXA  5. BMI 34.0-34.9,adult Discussed diet and exercise for person with BMI >25 Will recheck weight in 3-6 months  6. Acute congestive heart failure, unspecified heart failure type ([place]) Keep follow up with cardiology - torsemide (DEMADEX) 20 MG tablet; Take 2 tablets (40 mg total) by mouth daily.  Dispense: 180 tablet; Refill: 1 - hydrALAZINE (APRESOLINE) 25 MG tablet; Take 1 tablet (25 mg total) by  mouth 3 (three) times daily.  Dispense: 90 tablet; Refill: 5  7. Gastroesophageal reflux disease Avoid spicy foods Do not eat 2 hours prior to bedtime - pantoprazole (PROTONIX) 40 MG tablet; Take 1 tablet (40 mg total) by mouth daily. (NEEDS TO BE SEEN BEFORE NEXT REFILL)  Dispense: 90 tablet; Refill: 1   Labs pending Health Maintenance reviewed Diet and exercise encouraged  Follow up plan: 6 months   Mary-Margaret Hassell Done, FNP

## 2021-01-23 LAB — CBC WITH DIFFERENTIAL/PLATELET
Basophils Absolute: 0.1 10*3/uL (ref 0.0–0.2)
Basos: 1 %
EOS (ABSOLUTE): 0.1 10*3/uL (ref 0.0–0.4)
Eos: 2 %
Hematocrit: 33.6 % — ABNORMAL LOW (ref 34.0–46.6)
Hemoglobin: 11 g/dL — ABNORMAL LOW (ref 11.1–15.9)
Immature Grans (Abs): 0 10*3/uL (ref 0.0–0.1)
Immature Granulocytes: 0 %
Lymphocytes Absolute: 1.1 10*3/uL (ref 0.7–3.1)
Lymphs: 19 %
MCH: 30.1 pg (ref 26.6–33.0)
MCHC: 32.7 g/dL (ref 31.5–35.7)
MCV: 92 fL (ref 79–97)
Monocytes Absolute: 0.4 10*3/uL (ref 0.1–0.9)
Monocytes: 7 %
Neutrophils Absolute: 4.1 10*3/uL (ref 1.4–7.0)
Neutrophils: 71 %
Platelets: 222 10*3/uL (ref 150–450)
RBC: 3.65 x10E6/uL — ABNORMAL LOW (ref 3.77–5.28)
RDW: 13.2 % (ref 11.7–15.4)
WBC: 5.9 10*3/uL (ref 3.4–10.8)

## 2021-01-23 LAB — CMP14+EGFR
ALT: 14 IU/L (ref 0–32)
AST: 16 IU/L (ref 0–40)
Albumin/Globulin Ratio: 1.7 (ref 1.2–2.2)
Albumin: 3.9 g/dL (ref 3.8–4.8)
Alkaline Phosphatase: 120 IU/L (ref 44–121)
BUN/Creatinine Ratio: 15 (ref 12–28)
BUN: 30 mg/dL — ABNORMAL HIGH (ref 8–27)
Bilirubin Total: 0.6 mg/dL (ref 0.0–1.2)
CO2: 22 mmol/L (ref 20–29)
Calcium: 9.4 mg/dL (ref 8.7–10.3)
Chloride: 111 mmol/L — ABNORMAL HIGH (ref 96–106)
Creatinine, Ser: 2.06 mg/dL — ABNORMAL HIGH (ref 0.57–1.00)
Globulin, Total: 2.3 g/dL (ref 1.5–4.5)
Glucose: 85 mg/dL (ref 70–99)
Potassium: 4.9 mmol/L (ref 3.5–5.2)
Sodium: 145 mmol/L — ABNORMAL HIGH (ref 134–144)
Total Protein: 6.2 g/dL (ref 6.0–8.5)
eGFR: 26 mL/min/{1.73_m2} — ABNORMAL LOW (ref >59)

## 2021-01-23 LAB — LIPID PANEL
Chol/HDL Ratio: 3 ratio (ref 0.0–4.4)
Cholesterol, Total: 123 mg/dL (ref 100–199)
HDL: 41 mg/dL (ref >39)
LDL Chol Calc (NIH): 69 mg/dL (ref 0–99)
Triglycerides: 58 mg/dL (ref 0–149)
VLDL Cholesterol Cal: 13 mg/dL (ref 5–40)

## 2021-01-26 NOTE — Progress Notes (Signed)
dexa results showed- osteopenia, which is low bone mass. Your FRAX score , which is your 10 year probability of developing a fracture was 6.7%. Recommendations are :  Fall prevention Daily calcium supplement Daily vitamin d supplement of at least 1000IU Weight bearing exercises Repeat dexascan in 2 years

## 2021-03-04 ENCOUNTER — Ambulatory Visit: Payer: PRIVATE HEALTH INSURANCE | Admitting: Nurse Practitioner

## 2021-03-04 ENCOUNTER — Encounter: Payer: Self-pay | Admitting: Nurse Practitioner

## 2021-03-04 VITALS — BP 138/84 | HR 72 | Temp 97.7°F | Resp 20 | Ht 69.0 in | Wt 239.0 lb

## 2021-03-04 DIAGNOSIS — J069 Acute upper respiratory infection, unspecified: Secondary | ICD-10-CM | POA: Diagnosis not present

## 2021-03-04 MED ORDER — PSEUDOEPH-BROMPHEN-DM 30-2-10 MG/5ML PO SYRP
5.0000 mL | ORAL_SOLUTION | Freq: Four times a day (QID) | ORAL | 0 refills | Status: DC | PRN
Start: 2021-03-04 — End: 2021-07-04

## 2021-03-04 MED ORDER — AZITHROMYCIN 250 MG PO TABS: ORAL_TABLET | ORAL | 0 refills | Status: AC

## 2021-03-04 NOTE — Patient Instructions (Signed)

## 2021-03-04 NOTE — Progress Notes (Signed)
Acute Office Visit  Subjective:    Patient ID: Elene Downum, female    DOB: 1955-05-21, 66 y.o.   MRN: [id number]  Chief Complaint  Patient presents with   URI    URI  This is a new problem. The current episode started in the past 7 days. The problem has been gradually worsening. There has been no fever. Associated symptoms include congestion, coughing, headaches and swollen glands. Pertinent negatives include no diarrhea or sore throat.   Past Medical History:  Diagnosis Date   GERD (gastroesophageal reflux disease)    Hypertension    Stroke [facility])     History reviewed. No pertinent surgical history.  Family History  Problem Relation Age of Onset   Heart disease Mother    Diabetes Mother    Cancer Mother    Aneurysm Father    Hypertension Father    Hypertension Brother     Social History   Socioeconomic History   Marital status: Single    Spouse name: Not on file   Number of children: Not on file   Years of education: Not on file   Highest education level: Not on file  Occupational History   Not on file  Tobacco Use   Smoking status: Every Day    Packs/day: 0.50    Types: Cigarettes   Smokeless tobacco: Never  Substance and Sexual Activity   Alcohol use: No   Drug use: No   Sexual activity: Not on file  Other Topics Concern   Not on file  Social History Narrative   Not on file   Social Determinants of Health   Financial Resource Strain: Not on file  Food Insecurity: Not on file  Transportation Needs: Not on file  Physical Activity: Not on file  Stress: Not on file  Social Connections: Not on file  Intimate Partner Violence: Not on file    Outpatient Medications Prior to Visit  Medication Sig Dispense Refill   aspirin EC 81 MG tablet Take 81 mg by mouth daily. Swallow whole.     ferrous sulfate 325 (65 FE) MG EC tablet Take 325 mg by mouth 3 (three) times daily with meals.     hydrALAZINE (APRESOLINE) 25 MG tablet Take 1 tablet (25 mg total) by  mouth 3 (three) times daily. 90 tablet 5   loratadine (CLARITIN) 10 MG tablet Take 1 tablet (10 mg total) by mouth daily. 90 tablet 0   pantoprazole (PROTONIX) 40 MG tablet Take 1 tablet (40 mg total) by mouth daily. (NEEDS TO BE SEEN BEFORE NEXT REFILL) 90 tablet 1   torsemide (DEMADEX) 20 MG tablet Take 2 tablets (40 mg total) by mouth daily. 180 tablet 1   VITAMIN D PO Take by mouth.     No facility-administered medications prior to visit.    No Known Allergies  Review of Systems  Constitutional: Negative.   HENT:  Positive for congestion. Negative for sore throat.   Respiratory:  Positive for cough.   Gastrointestinal:  Negative for diarrhea.  Neurological:  Positive for headaches.  All other systems reviewed and are negative.     Objective:    Physical Exam Vitals and nursing note reviewed.  Constitutional:      Appearance: Normal appearance.  HENT:     Head: Normocephalic.     Nose: Congestion present.     Mouth/Throat:     Mouth: Mucous membranes are moist.  Eyes:     Conjunctiva/sclera: Conjunctivae normal.  Cardiovascular:  Rate and Rhythm: Normal rate.  Pulmonary:     Effort: Pulmonary effort is normal.     Breath sounds: Normal breath sounds.  Abdominal:     General: Bowel sounds are normal.  Skin:    General: Skin is warm.     Findings: No rash.  Neurological:     Mental Status: She is alert and oriented to person, place, and time.  Psychiatric:        Mood and Affect: Mood normal.        Behavior: Behavior normal.    BP 138/84    Pulse 72    Temp 97.7 F (36.5 C) (Oral)    Resp 20    Ht _0  (1.753 m)    Wt 239 lb (108.4 kg)    SpO2 95%    BMI 35.29 kg/m  Wt Readings from Last 3 Encounters:  03/04/21 239 lb (108.4 kg)  01/22/21 236 lb (107 kg)  10/06/19 263 lb (119.3 kg)    Health Maintenance Due  Topic Date Due   COLONOSCOPY (Pts 45-84yr Insurance coverage will need to be confirmed)  05/27/2020   COVID-19 Vaccine (3 - Booster for  Moderna series) 03/05/2021    There are no preventive care reminders to display for this patient.   Lab Results  Component Value Date   TSH 1.370 03/07/2019   Lab Results  Component Value Date   WBC 5.9 01/22/2021   HGB 11.0 (L) 01/22/2021   HCT 33.6 (L) 01/22/2021   MCV 92 01/22/2021   PLT 222 01/22/2021   Lab Results  Component Value Date   NA 145 (H) 01/22/2021   K 4.9 01/22/2021   CO2 22 01/22/2021   GLUCOSE 85 01/22/2021   BUN 30 (H) 01/22/2021   CREATININE 2.06 (H) 01/22/2021   BILITOT 0.6 01/22/2021   ALKPHOS 120 01/22/2021   AST 16 01/22/2021   ALT 14 01/22/2021   PROT 6.2 01/22/2021   ALBUMIN 3.9 01/22/2021   CALCIUM 9.4 01/22/2021   EGFR 26 (L) 01/22/2021   Lab Results  Component Value Date   CHOL 123 01/22/2021   Lab Results  Component Value Date   HDL 41 01/22/2021   Lab Results  Component Value Date   LDLCALC 69 01/22/2021   Lab Results  Component Value Date   TRIG 58 01/22/2021   Lab Results  Component Value Date   CHOLHDL 3.0 01/22/2021   No results found for: HGBA1C     Assessment & Plan:  Take meds as prescribed - Use a cool mist humidifier  -Use saline nose sprays frequently -Force fluids -For fever or aches or pains- take Tylenol or ibuprofen. -Azithromycin 500 mg tablet by mouth day 1, 250 mg tablet day 2-5. -If symptoms do not improve, she may need to be COVID tested to rule this out -Bromfed for cough and cold symptoms Follow up with worsening unresolved symptoms  Problem List Items Addressed This Visit       Respiratory   Upper respiratory infection with cough and congestion - Primary   Relevant Medications   azithromycin (ZITHROMAX) 250 MG tablet   brompheniramine-pseudoephedrine-DM 30-2-10 MG/5ML syrup     Meds ordered this encounter  Medications   azithromycin (ZITHROMAX) 250 MG tablet    Sig: Take 2 tablets on day 1, then 1 tablet daily on days 2 through 5    Dispense:  6 tablet    Refill:  0    Order  Specific Question:   Supervising  Provider    Answer:   Claretta Fraise [phone]   brompheniramine-pseudoephedrine-DM 30-2-10 MG/5ML syrup    Sig: Take 5 mLs by mouth 4 (four) times daily as needed.    Dispense:  120 mL    Refill:  0    Order Specific Question:   Supervising Provider    Answer:   Claretta Fraise [[id number]]     Ivy Lynn, NP

## 2021-06-24 DIAGNOSIS — I5032 Chronic diastolic (congestive) heart failure: Secondary | ICD-10-CM | POA: Insufficient documentation

## 2021-06-24 DIAGNOSIS — I5023 Acute on chronic systolic (congestive) heart failure: Secondary | ICD-10-CM | POA: Insufficient documentation

## 2021-06-24 DIAGNOSIS — R7989 Other specified abnormal findings of blood chemistry: Secondary | ICD-10-CM | POA: Insufficient documentation

## 2021-06-24 DIAGNOSIS — R841 Abnormal level of hormones in specimens from respiratory organs and thorax: Secondary | ICD-10-CM | POA: Insufficient documentation

## 2021-07-01 ENCOUNTER — Telehealth: Payer: Self-pay

## 2021-07-01 NOTE — Telephone Encounter (Signed)
Transition Care Management Follow-up Telephone Call ?Date of discharge and from where: [facility] - 06/27/21 - acute on chronic CHF ?How have you been since you were released from the hospital? Still pretty weak and gets tired and SOB very fast ?Any questions or concerns? No ? ?Items Reviewed: ?Did the pt receive and understand the discharge instructions provided? Yes  ?Medications obtained and verified? Yes  ?Other? No  ?Any new allergies since your discharge? No  ?Dietary orders reviewed? Yes ?Do you have support at home? Yes  ? ?Home Care and Equipment/Supplies: ?Were home health services ordered? no ? ?Were any new equipment or medical supplies ordered?  No ? ?Functional Questionnaire: (I = Independent and D = Dependent) ?ADLs: I ? ?Bathing/Dressing- I ? ?Meal Prep- I ? ?Eating- I ? ?Maintaining continence- I ? ?Transferring/Ambulation- I ? ?Managing Meds- I ? ?Follow up appointments reviewed: ? ?PCP Hospital f/u appt confirmed? Yes  Scheduled to see Marjorie Smolder on 07/04/21 @ 9. ?[facility] f/u appt confirmed? Yes  Scheduled to see Nephrologist in [place] on 07/02/21 @ 1:30. ?Are transportation arrangements needed? No  ?If their condition worsens, is the pt aware to call PCP or go to the Emergency Dept.? Yes ?Was the patient provided with contact information for the PCP's office or ED? Yes ?Was to pt encouraged to call back with questions or concerns? Yes  ?

## 2021-07-04 ENCOUNTER — Encounter: Payer: Self-pay | Admitting: Family Medicine

## 2021-07-04 ENCOUNTER — Ambulatory Visit: Payer: PRIVATE HEALTH INSURANCE | Admitting: Family Medicine

## 2021-07-04 VITALS — BP 171/65 | HR 53 | Temp 97.9°F | Ht 69.0 in | Wt 227.1 lb

## 2021-07-04 DIAGNOSIS — Z09 Encounter for follow-up examination after completed treatment for conditions other than malignant neoplasm: Secondary | ICD-10-CM

## 2021-07-04 DIAGNOSIS — I10 Essential (primary) hypertension: Secondary | ICD-10-CM | POA: Diagnosis not present

## 2021-07-04 DIAGNOSIS — N049 Nephrotic syndrome with unspecified morphologic changes: Secondary | ICD-10-CM

## 2021-07-04 DIAGNOSIS — R0683 Snoring: Secondary | ICD-10-CM

## 2021-07-04 DIAGNOSIS — I5033 Acute on chronic diastolic (congestive) heart failure: Secondary | ICD-10-CM | POA: Diagnosis not present

## 2021-07-04 NOTE — Progress Notes (Signed)
? ?Established Patient Office Visit ? ?Subjective   ?Patient ID: Lori Ortiz, female    DOB: 08/17/1955  Age: 66 y.o. MRN: [id number] ? ?Chief Complaint  ?Patient presents with  ? Transitions Of Care  ? ? ?HPI ? ?Today's visit was for Transitional Care Management. ? ?The patient was discharged from [facility] on 06/27/21 with a primary diagnosis of acute on chronic diastolic heart failure.  ? ?Contact with the patient and/or caregiver, by a clinical staff member, was made on 07/01/21 and was documented as a telephone encounter within the EMR. ? ? ?Lori Ortiz had an appt with her cardiologist on 5/1 and was instructed to go to [facility] for admission for a 40 lb weight gain that was no responding to PO diuretics. During admission, she had 6L of urine output with IV diuretics. She was on oxygen for a short period of time but did fine on oxygen test prior to discharge. Her creatine was elevated but stable at 2.9 with GFR of 26. Urine testing showed nephrotic range proteinuria. A referral was placed to nephrology. Her BP remained elevated at discharge. Hydralazine was increased to 50 mg TID and isordil 20 mg TID was added. Instructed to remain on torsemide 20 mg TID.  ? ?She has had her outpatient follow up with nephrology. Nephrology believes edema has been driven by nephrotic syndrome, not CHF. ?Labs for hep, hiv, PTH, Ca, phos, vit D, and autoimmune labs were done. A kidney biopsy was planned for next week. She was told to avoid NSAIDs and amlodipine 5 mg was ordered 2 days ago for her to start. Also instructed to take torsemide 60 mg daily not 20 mg TID. Her BP was 174/58 at this appt. ACE/ARB was not started due to recent decline in GFR to 17. ? ?She was notified that both her cardiologist and nephrologist were out of network. She needs new referrals for both.  ? ?She has not been checking BP at home. Has been checking weight, no weight gain. She has actually lost a few pounds since discharge. Reports stable swelling in both feet  and ankles. Reports shortness of breath with distances of 30 feet or so, this is stable for her. Denies chest pain, focal weakness, visual disturbances, confusion, orthopnea.  ? ?She would also like a referral for a sleep study. She reports that she snoring loudly and that she has been told that she gasps and stops breathing in her sleep.  ? ? ?Past Medical History:  ?Diagnosis Date  ? GERD (gastroesophageal reflux disease)   ? Hypertension   ? Stroke (HCC)   ? ?  ? ?ROS ?As per HPI.  ?  ?Objective:  ?  ? ?BP (!) 171/65   Pulse (!) 53   Temp 97.9 ?F (36.6 ?C) (Temporal)   Ht 5' 9" (1.753 m)   Wt 227 lb 2 oz (103 kg)   SpO2 96%   BMI 33.54 kg/m?  ?BP Readings from Last 3 Encounters:  ?07/04/21 (!) 171/65  ?03/04/21 138/84  ?01/22/21 (!) 142/87  ? ? ?Physical Exam ?Vitals and nursing note reviewed.  ?Constitutional:   ?   General: She is not in acute distress. ?   Appearance: She is not ill-appearing, toxic-appearing or diaphoretic.  ?HENT:  ?   Head: Normocephalic and atraumatic.  ?   Nose: Nose normal.  ?   Mouth/Throat:  ?   Mouth: Mucous membranes are moist.  ?   Pharynx: Oropharynx is clear.  ?Eyes:  ?     Extraocular Movements: Extraocular movements intact.  ?   Pupils: Pupils are equal, round, and reactive to light.  ?Neck:  ?   Vascular: No JVD.  ?Cardiovascular:  ?   Rate and Rhythm: Normal rate and regular rhythm.  ?   Chest Wall: PMI is not displaced. No thrill.  ?   Heart sounds: Normal heart sounds. No murmur heard. ?  No friction rub. No gallop. No S3 or S4 sounds.  ?Pulmonary:  ?   Effort: Pulmonary effort is normal. No respiratory distress.  ?   Breath sounds: Normal breath sounds. No wheezing, rhonchi or rales.  ?Chest:  ?   Chest wall: No tenderness.  ?Abdominal:  ?   General: Bowel sounds are normal. There is no distension.  ?   Palpations: Abdomen is soft.  ?   Tenderness: There is no abdominal tenderness. There is no guarding or rebound.  ?Musculoskeletal:  ?   Cervical back: No rigidity.  ?    Right lower leg: 1+ Edema (nonpitting) present.  ?   Left lower leg: 1+ Edema (nonpitting) present.  ?Skin: ?   General: Skin is warm and dry.  ?Neurological:  ?   General: No focal deficit present.  ?   Mental Status: She is alert and oriented to person, place, and time.  ?   Motor: No weakness.  ?   Gait: Gait normal.  ?Psychiatric:     ?   Mood and Affect: Mood normal.     ?   Behavior: Behavior normal.     ?   Thought Content: Thought content normal.     ?   Judgment: Judgment normal.  ? ? ? ?No results found for any visits on 07/04/21. ? ?Last CBC ?Lab Results  ?Component Value Date  ? WBC 5.9 01/22/2021  ? HGB 11.0 (L) 01/22/2021  ? HCT 33.6 (L) 01/22/2021  ? MCV 92 01/22/2021  ? MCH 30.1 01/22/2021  ? RDW 13.2 01/22/2021  ? PLT 222 01/22/2021  ? ?Last metabolic panel ?Lab Results  ?Component Value Date  ? GLUCOSE 85 01/22/2021  ? NA 145 (H) 01/22/2021  ? K 4.9 01/22/2021  ? CL 111 (H) 01/22/2021  ? CO2 22 01/22/2021  ? BUN 30 (H) 01/22/2021  ? CREATININE 2.06 (H) 01/22/2021  ? EGFR 26 (L) 01/22/2021  ? CALCIUM 9.4 01/22/2021  ? PROT 6.2 01/22/2021  ? ALBUMIN 3.9 01/22/2021  ? LABGLOB 2.3 01/22/2021  ? AGRATIO 1.7 01/22/2021  ? BILITOT 0.6 01/22/2021  ? ALKPHOS 120 01/22/2021  ? AST 16 01/22/2021  ? ALT 14 01/22/2021  ? ?Last lipids ?Lab Results  ?Component Value Date  ? CHOL 123 01/22/2021  ? HDL 41 01/22/2021  ? [place] 69 01/22/2021  ? TRIG 58 01/22/2021  ? CHOLHDL 3.0 01/22/2021  ? ?Last hemoglobin A1c ?No results found for: HGBA1C ?Last thyroid functions ?Lab Results  ?Component Value Date  ? TSH 1.370 03/07/2019  ? T4TOTAL 7.2 03/07/2019  ? ?  ? ?The ASCVD Risk score (Arnett DK, et al., 2019) failed to calculate for the following reasons: ?  The valid total cholesterol range is 130 to 320 mg/dL ? ?  ?Assessment & Plan:  ? ?Lori Ortiz was seen today for transitions of care. ? ?Diagnoses and all orders for this visit: ? ?Acute on chronic diastolic heart failure ([place]) ?Urgent referral to new cardiologist placed.  Labs pending. Continue to monitor weight at home. Appears stable. Mild non pitting edema to bilateral ankles. Strict return precautions.  ?-  Ambulatory referral to Cardiology ?-     CMP14+EGFR ?-     CBC with Differential/Platelet ? ?Uncontrolled hypertension ?Remains uncontrolled. Just started on amlodipine yesterday. Monitor BP at home. Will follow up in 1 week, can increase amlodipine to 10 mg daily if needed. Discussed when to seek emergency care.  ?-     Ambulatory referral to Cardiology ?-     CMP14+EGFR ?-     CBC with Differential/Platelet ? ?Nephrotic syndrome ?New urgent referral placed. Reviewed note from nephrology appt 2 days ago with [facility], they had planned for biopsy next week. Last GFR was 17.  ?-     Ambulatory referral to Nephrology ?-     CMP14+EGFR ?-     CBC with Differential/Platelet ? ?Loud snoring ?Referral placed.  ?-     Ambulatory referral to Sleep Studies ? ?Hospital discharge follow-up ?Reviewed ED record.  ? ?Follow up in 1 weeks, sooner for new or worsening symptoms. Discussed when to seek emergency care.  ? ?The patient indicates understanding of these issues and agrees with the plan. ? ?Tiffany M Morgan, FNP ? ?

## 2021-07-05 ENCOUNTER — Telehealth: Payer: Self-pay | Admitting: Emergency Medicine

## 2021-07-05 LAB — CBC WITH DIFFERENTIAL/PLATELET
Basophils Absolute: 0.1 10*3/uL (ref 0.0–0.2)
Basos: 1 %
EOS (ABSOLUTE): 0.1 10*3/uL (ref 0.0–0.4)
Eos: 3 %
Hematocrit: 32 % — ABNORMAL LOW (ref 34.0–46.6)
Hemoglobin: 10.8 g/dL — ABNORMAL LOW (ref 11.1–15.9)
Immature Grans (Abs): 0 10*3/uL (ref 0.0–0.1)
Immature Granulocytes: 0 %
Lymphocytes Absolute: 0.9 10*3/uL (ref 0.7–3.1)
Lymphs: 20 %
MCH: 31.3 pg (ref 26.6–33.0)
MCHC: 33.8 g/dL (ref 31.5–35.7)
MCV: 93 fL (ref 79–97)
Monocytes Absolute: 0.5 10*3/uL (ref 0.1–0.9)
Monocytes: 12 %
Neutrophils Absolute: 2.9 10*3/uL (ref 1.4–7.0)
Neutrophils: 64 %
Platelets: 199 10*3/uL (ref 150–450)
RBC: 3.45 x10E6/uL — ABNORMAL LOW (ref 3.77–5.28)
RDW: 12.9 % (ref 11.7–15.4)
WBC: 4.6 10*3/uL (ref 3.4–10.8)

## 2021-07-05 LAB — CMP14+EGFR
ALT: 49 IU/L — ABNORMAL HIGH (ref 0–32)
AST: 46 IU/L — ABNORMAL HIGH (ref 0–40)
Albumin/Globulin Ratio: 1.6 (ref 1.2–2.2)
Albumin: 4.1 g/dL (ref 3.8–4.8)
Alkaline Phosphatase: 131 IU/L — ABNORMAL HIGH (ref 44–121)
BUN/Creatinine Ratio: 10 — ABNORMAL LOW (ref 12–28)
BUN: 32 mg/dL — ABNORMAL HIGH (ref 8–27)
Bilirubin Total: 0.4 mg/dL (ref 0.0–1.2)
CO2: 26 mmol/L (ref 20–29)
Calcium: 9.6 mg/dL (ref 8.7–10.3)
Chloride: 104 mmol/L (ref 96–106)
Creatinine, Ser: 3.14 mg/dL (ref 0.57–1.00)
Globulin, Total: 2.5 g/dL (ref 1.5–4.5)
Glucose: 99 mg/dL (ref 70–99)
Potassium: 4.5 mmol/L (ref 3.5–5.2)
Sodium: 145 mmol/L — ABNORMAL HIGH (ref 134–144)
Total Protein: 6.6 g/dL (ref 6.0–8.5)
eGFR: 16 mL/min/{1.73_m2} — ABNORMAL LOW (ref >59)

## 2021-07-05 NOTE — Telephone Encounter (Signed)
Call from [facility]- Creatine 3.14. ? ?Please advise. ?

## 2021-07-05 NOTE — Telephone Encounter (Signed)
Patient aware and verbalizes understanding. 

## 2021-07-05 NOTE — Telephone Encounter (Signed)
Please make sure patient is staying hydrated and avoiding NSAIDs. This lab work is unchanged from when she saw her nephrologist the day prior.  ?

## 2021-07-08 ENCOUNTER — Telehealth: Payer: Self-pay | Admitting: Nurse Practitioner

## 2021-07-08 ENCOUNTER — Ambulatory Visit: Payer: PRIVATE HEALTH INSURANCE | Admitting: Nurse Practitioner

## 2021-07-08 ENCOUNTER — Encounter: Payer: Self-pay | Admitting: Nurse Practitioner

## 2021-07-08 VITALS — BP 168/70 | HR 52 | Temp 97.8°F | Resp 20 | Ht 69.0 in | Wt 228.0 lb

## 2021-07-08 DIAGNOSIS — I10 Essential (primary) hypertension: Secondary | ICD-10-CM | POA: Diagnosis not present

## 2021-07-08 DIAGNOSIS — R609 Edema, unspecified: Secondary | ICD-10-CM | POA: Diagnosis not present

## 2021-07-08 MED ORDER — LOSARTAN POTASSIUM 100 MG PO TABS: 100.0000 mg | ORAL_TABLET | Freq: Every day | ORAL | 1 refills | Status: DC

## 2021-07-08 NOTE — Patient Instructions (Signed)
Food Basics for Chronic Kidney Disease Chronic kidney disease (CKD) occurs when the kidneys are permanently damaged over a long period of time. When your kidneys are not working well, they cannot remove waste, fluids, and other substances from your blood as well as they did before. The substances can build up, which can worsen kidney damage and affect how your body functions. Certain foods lead to a buildup of these substances. By changing your diet, you can help prevent more kidney damage and delay or prevent the need for dialysis. What are tips for following this plan? Reading food labels Check the amount of salt (sodium) in foods. Choose foods that have less than 300 milligrams (mg) per serving. Check the ingredient list for phosphorus or potassium-based additives or preservatives. Check the amount of saturated fat and trans fat. Limit or avoid these fats as told by your dietitian. Shopping Avoid buying foods that are: Processed or prepackaged. Calcium-enriched or that have calcium added to them (are fortified). Do not buy foods that have salt or sodium listed among the first five ingredients. Buy canned vegetables and beans that say "no salt added" or "low sodium" and rinse them before eating. Cooking Soak vegetables, such as potatoes, before cooking to reduce potassium. To do this: Peel and cut the vegetables into small pieces. Soak the vegetables in warm water for at least 2 hours. For every 1 cup of vegetables, use 10 cups of water. Drain and rinse the vegetables with warm water. Boil the vegetables for at least 5 minutes. Meal planning Limit the amount of protein you eat from plant and animal sources each day. Do not add salt to food when cooking or before eating. Eat meals and snacks at around the same time each day. General information Talk with your health care provider about whether you should take a vitamin and mineral supplement. Use standard measuring cups and spoons to  measure servings of foods. Use a kitchen scale to measure portions of protein foods. If told by your health care provider, avoid drinking too much fluid. Measure and count all liquids, including water, ice, soups, flavored gelatin, and frozen desserts such as ice pops or ice cream. If you have diabetes: If you have diabetes (diabetes mellitus) and CKD, it is important to keep your blood sugar (glucose) in the target range recommended by your health care provider. Follow your diabetes management plan. This may include: Checking your blood glucose regularly. Taking medicines by mouth, taking insulin, or taking both. Exercising for at least 30 minutes on 5 or more days each week, or as told by your health care provider. Tracking how many servings of carbohydrates you eat at each meal. You may be given specific guidelines on how much of certain foods and nutrients you may eat, depending on your stage of kidney disease and whether you have high blood pressure (hypertension). Follow your meal plan as told by your dietitian. What nutrients should I limit? Work with your health care provider and dietitian to develop a meal plan that is right for you. Foods you can eat and foods you should limit or avoid will depend on the stage of your kidney disease and any other health conditions you have. The items listed below are not a complete list. Talk with your dietitian about what dietary choices are best for you. Potassium Potassium affects how steadily your heart beats. If too much potassium builds up in your blood, the potassium can cause an irregular heartbeat or even a heart attack. You   may need to limit or avoid foods that are high in potassium, such as: Milk and soy milk. Fruits, such as bananas, apricots, nectarines, melon, prunes, raisins, kiwi, and oranges. Vegetables, such as potatoes, sweet potatoes, yams, tomatoes, leafy greens, beets, avocado, pumpkin, and winter squash. White and lima  beans. Whole-wheat breads and pastas. Beans and nuts. Phosphorus Phosphorus is a mineral found in your bones. A balance between calcium and phosphorus is needed to build and maintain healthy bones. Too much phosphorus pulls calcium from your bones. This can make your bones weak and more likely to break. Too much phosphorus can also make your skin itch. You may need to limit or avoid foods that are high in phosphorus, such as: Milk and dairy products. Dried beans and peas. Tofu, soy milk, and other soy-based meat replacements. Dark-colored sodas. Nuts and peanut butter. Meat, poultry, and fish. Bran cereals and oatmeal. Protein  Protein helps you make and keep muscle. It also helps to repair your body's cells and tissues. One of the natural breakdown products of protein is a waste product called urea. When your kidneys are not working properly, they cannot remove wastes, such as urea. Reducing how much protein you eat can help prevent a buildup of urea in your blood. Depending on your stage of kidney disease, you may need to limit foods that are high in protein. Sources of animal protein include: Meat (all types). Fish and seafood. Poultry. Eggs. Dairy. Other protein foods include: Beans and legumes. Nuts and nut butter. Soy and tofu.  Sodium Sodium helps to maintain a healthy balance of fluids in your body. Too much sodium can increase your blood pressure and have a negative effect on your heart and lungs. Too much sodium can also cause your body to retain too much fluid, making your kidneys work harder. Most people should have less than 2,300 mg of sodium each day. If you have hypertension, you may need to limit your sodium to 1,500 mg each day. You may need to limit or avoid foods that are high in sodium, such as: Salt seasonings. Soy sauce. Cured and processed meats. Salted crackers and snack foods. Fast food. Canned soups and most canned foods. Pickled foods. Vegetable  juice. Boxed mixes or ready-to-eat boxed meals and side dishes. Bottled dressings, sauces, and marinades. Talk with your dietitian about how much potassium, phosphorus, protein, and sodium you may have each day. Summary Chronic kidney disease (CKD) can lead to a buildup of waste and extra substances in the body. Certain foods lead to a buildup of these substances. By changing your diet as told, you can help prevent more kidney damage and delay or prevent the need for dialysis. Food intake changes are different for each person with CKD. Work with a dietitian to set up nutrient goals and a meal plan that is right for you. If you have diabetes and CKD, it is important to keep your blood sugar in the target range recommended by your health care provider. This information is not intended to replace advice given to you by your health care provider. Make sure you discuss any questions you have with your health care provider. Document Revised: 06/06/2019 Document Reviewed: 06/06/2019 Elsevier Patient Education  2023 [employer].  

## 2021-07-08 NOTE — Telephone Encounter (Signed)
Pts referral was sent to [facility] which pt is aware of. Says she has not heard from anyone about it. Asked pt if she has called them to schedule an appt. Pt declined. Said she was told by MMM that if she had not heard anything from anyone to let MMM know and MMM would call and see what is going on.  ?

## 2021-07-08 NOTE — Progress Notes (Signed)
? ?Subjective:  ? ? Patient ID: Lori Ortiz, female    DOB: September 17, 1955, 66 y.o.   MRN: [id number] ? ? ?Chief Complaint: Discuss labs ? ? ?HPI ?Patient comes in  with elevated blood pressure and edema. Her last creatine has been >2.0. She just recently saw nephrology but they are not in network, so we are having to do referral to another nephrologist. She has had edema in bil lower ext and elevated blood pressure. Blood pressure at home has been running greater than 782 systolic. ? ?BP Readings from Last 3 Encounters:  ?07/08/21 (!) 168/70  ?07/04/21 (!) 171/65  ?03/04/21 138/84  ? ? ? ?Review of Systems  ?Constitutional:  Negative for diaphoresis.  ?Eyes:  Negative for pain.  ?Respiratory:  Negative for shortness of breath.   ?Cardiovascular:  Negative for chest pain, palpitations and leg swelling.  ?Gastrointestinal:  Negative for abdominal pain.  ?Endocrine: Negative for polydipsia.  ?Skin:  Negative for rash.  ?Neurological:  Negative for dizziness, weakness and headaches.  ?Hematological:  Does not bruise/bleed easily.  ?All other systems reviewed and are negative. ? ?   ?Objective:  ? Physical Exam ?Vitals and nursing note reviewed.  ?Constitutional:   ?   General: She is not in acute distress. ?   Appearance: Normal appearance. She is well-developed.  ?Neck:  ?   Vascular: No carotid bruit or JVD.  ?Cardiovascular:  ?   Rate and Rhythm: Normal rate and regular rhythm.  ?   Heart sounds: Murmur (3/6 systolic) heard.  ?Pulmonary:  ?   Effort: Pulmonary effort is normal. No respiratory distress.  ?   Breath sounds: Normal breath sounds. No wheezing or rales.  ?Chest:  ?   Chest wall: No tenderness.  ?Abdominal:  ?   General: Bowel sounds are normal. There is no distension or abdominal bruit.  ?   Palpations: Abdomen is soft. There is no hepatomegaly, splenomegaly, mass or pulsatile mass.  ?   Tenderness: There is no abdominal tenderness.  ?Musculoskeletal:     ?   General: Normal range of motion.  ?   Cervical  back: Normal range of motion and neck supple.  ?   Right lower leg: Edema (2+) present.  ?   Left lower leg: Edema (2+) present.  ?Lymphadenopathy:  ?   Cervical: No cervical adenopathy.  ?Skin: ?   General: Skin is warm and dry.  ?Neurological:  ?   Mental Status: She is alert and oriented to person, place, and time.  ?   Deep Tendon Reflexes: Reflexes are normal and symmetric.  ?Psychiatric:     ?   Behavior: Behavior normal.     ?   Thought Content: Thought content normal.     ?   Judgment: Judgment normal.  ? ? ?BP (!) 168/70   Pulse (!) 52   Temp 97.8 ?F (36.6 ?C) (Temporal)   Resp 20   Ht '5\' 9"'$  (1.753 m)   Wt 228 lb (103.4 kg)   SpO2 96%   BMI 33.67 kg/m?  ? ? ? ?   ?Assessment & Plan:  ? ?Monque Haggar in today with chief complaint of Discuss labs ? ? ?1. Peripheral edema ?Elevate legs when sitting ?Compression socks ?- Ambulatory referral to Nephrology ?- Ambulatory referral to Cardiology ? ?2. Primary hypertension ?Low sodium diet ?Added losartan ?- losartan (COZAAR) 100 MG tablet; Take 1 tablet (100 mg total) by mouth daily.  Dispense: 90 tablet; Refill: 1 ? ? ?Discussed all  lab results with family and patuent ?The above assessment and management plan was discussed with the patient. The patient verbalized understanding of and has agreed to the management plan. Patient is aware to call the clinic if symptoms persist or worsen. Patient is aware when to return to the clinic for a follow-up visit. Patient educated on when it is appropriate to go to the emergency department.  ? ?Mary-Margaret Hassell Done, FNP ? ? ?

## 2021-07-09 NOTE — Telephone Encounter (Signed)
Lori Ortiz, ? ?Could you check on the status of an appt for this pt. The cardio and nephrology referral have been approved. ? ?Explained that their offices would call with an appt but pt wants something asap. ? ?Do you have the office numbers to cardio and nephrology. ? ?Need to make pt aware of numbers so she can call them to hopefully speed things up. ?

## 2021-07-09 NOTE — Telephone Encounter (Signed)
Patient is calling back to speak to MMM about referral. Left message on 5-16 and hadn't heard back. ?

## 2021-07-09 NOTE — Telephone Encounter (Signed)
Patient calling back to check on referral. Said she was told she would know something yesterday but no one has called her. Please call back.  ?

## 2021-07-16 ENCOUNTER — Other Ambulatory Visit (HOSPITAL_COMMUNITY): Payer: Self-pay | Admitting: Nephrology

## 2021-07-16 ENCOUNTER — Other Ambulatory Visit: Payer: Self-pay | Admitting: Nephrology

## 2021-07-16 DIAGNOSIS — N184 Chronic kidney disease, stage 4 (severe): Secondary | ICD-10-CM

## 2021-07-23 ENCOUNTER — Encounter: Payer: Self-pay | Admitting: Nurse Practitioner

## 2021-07-23 ENCOUNTER — Ambulatory Visit: Payer: PRIVATE HEALTH INSURANCE | Admitting: Nurse Practitioner

## 2021-07-23 VITALS — BP 152/78 | HR 50 | Temp 97.9°F | Resp 20 | Ht 69.0 in | Wt 226.0 lb

## 2021-07-23 DIAGNOSIS — I4821 Permanent atrial fibrillation: Secondary | ICD-10-CM

## 2021-07-23 DIAGNOSIS — E782 Mixed hyperlipidemia: Secondary | ICD-10-CM | POA: Diagnosis not present

## 2021-07-23 DIAGNOSIS — Z8673 Personal history of transient ischemic attack (TIA), and cerebral infarction without residual deficits: Secondary | ICD-10-CM

## 2021-07-23 DIAGNOSIS — I5023 Acute on chronic systolic (congestive) heart failure: Secondary | ICD-10-CM

## 2021-07-23 DIAGNOSIS — I10 Essential (primary) hypertension: Secondary | ICD-10-CM | POA: Diagnosis not present

## 2021-07-23 DIAGNOSIS — Z1211 Encounter for screening for malignant neoplasm of colon: Secondary | ICD-10-CM

## 2021-07-23 DIAGNOSIS — I509 Heart failure, unspecified: Secondary | ICD-10-CM

## 2021-07-23 DIAGNOSIS — K219 Gastro-esophageal reflux disease without esophagitis: Secondary | ICD-10-CM

## 2021-07-23 MED ORDER — AMLODIPINE BESYLATE 5 MG PO TABS: 5.0000 mg | ORAL_TABLET | Freq: Every day | ORAL | 11 refills | Status: DC

## 2021-07-23 MED ORDER — ISOSORBIDE DINITRATE 10 MG PO TABS: 20.0000 mg | ORAL_TABLET | Freq: Three times a day (TID) | ORAL | 11 refills | Status: DC

## 2021-07-23 MED ORDER — FERROUS SULFATE 325 (65 FE) MG PO TBEC: 325.0000 mg | DELAYED_RELEASE_TABLET | Freq: Every day | ORAL | 1 refills | Status: DC

## 2021-07-23 MED ORDER — LOSARTAN POTASSIUM 100 MG PO TABS
100.0000 mg | ORAL_TABLET | Freq: Every day | ORAL | 1 refills | Status: DC
Start: 2021-07-23 — End: 2022-01-22

## 2021-07-23 MED ORDER — PANTOPRAZOLE SODIUM 40 MG PO TBEC
40.0000 mg | DELAYED_RELEASE_TABLET | Freq: Every day | ORAL | 1 refills | Status: DC
Start: 2021-07-23 — End: 2022-01-01

## 2021-07-23 MED ORDER — ATORVASTATIN CALCIUM 40 MG PO TABS
40.0000 mg | ORAL_TABLET | Freq: Every day | ORAL | 1 refills | Status: DC
Start: 2021-07-23 — End: 2022-01-01

## 2021-07-23 MED ORDER — TORSEMIDE 20 MG PO TABS: 40.0000 mg | ORAL_TABLET | Freq: Every day | ORAL | 1 refills | Status: AC

## 2021-07-23 NOTE — Patient Instructions (Signed)

## 2021-07-23 NOTE — Progress Notes (Signed)
Subjective:    Patient ID: Lori Ortiz, female    DOB: 06-25-1955, 66 y.o.   MRN: [id number]  Chief Complaint: medical management of chronic issues     HPI:  Lori Ortiz is a 66 y.o. who identifies as a female who was assigned female at birth.   Social history: Lives with: lives with a friend Work history: retired   Scientist, forensic in today for follow up of the following chronic medical issues:  1. Primary hypertension No c/o chest pain, sob or headache. Does not check blood pressure at home. BP Readings from Last 3 Encounters:  07/08/21 (!) 168/70  07/04/21 (!) 171/65  03/04/21 138/84    2. Mixed hyperlipidemia Dies try to watch diet bit does no dedicated exercise. Lab Results  Component Value Date   CHOL 123 01/22/2021   HDL 41 01/22/2021   LDLCALC 69 01/22/2021   TRIG 58 01/22/2021   CHOLHDL 3.0 01/22/2021   The ASCVD Risk score (Arnett DK, et al., 2019) failed to calculate for the following reasons:   The valid total cholesterol range is 130 to 320 mg/dL   3. Permanent atrial fibrillation (HCC) Is on no blood thinners but has a watchman device.  4. Acute on chronic systolic congestive heart failure ([place]) Is on isordil daily. Last saw cardiodlogy on 06/24/21. They sent her to the ED for IV diuresis. She was in the hospital for 3 days. No chnages were madeto her medications.  5. Gastroesophageal reflux disease, unspecified whether esophagitis present Is on protonix daily and is doing well.  6. 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  7. Morbid obesity ([place]) Wt Readings from Last 3 Encounters:  07/23/21 226 lb (102.5 kg)  07/08/21 228 lb (103.4 kg)  07/04/21 227 lb 2 oz (103 kg)   BMI Readings from Last 3 Encounters:  07/23/21 33.37 kg/m  07/08/21 33.67 kg/m  07/04/21 33.54 kg/m       New complaints: No issues Is scheduled fo ru/s kidney biopsy on thursday  No Known Allergies Outpatient  Encounter Medications as of 07/23/2021  Medication Sig   amLODipine (NORVASC) 5 MG tablet Take 1 tablet by mouth daily.   aspirin EC 81 MG tablet Take 81 mg by mouth daily. Swallow whole.   atorvastatin (LIPITOR) 40 MG tablet Take 40 mg by mouth daily.   Cholecalciferol (VITAMIN D3) 100000 UNIT/GM POWD Take by mouth.   ferrous sulfate 325 (65 FE) MG EC tablet Take 325 mg by mouth 3 (three) times daily with meals.   hydrALAZINE (APRESOLINE) 25 MG tablet Take 1 tablet (25 mg total) by mouth 3 (three) times daily.   isosorbide dinitrate (ISORDIL) 10 MG tablet Take 20 mg by mouth 3 (three) times daily.   losartan (COZAAR) 100 MG tablet Take 1 tablet (100 mg total) by mouth daily.   pantoprazole (PROTONIX) 40 MG tablet Take 1 tablet (40 mg total) by mouth daily. (NEEDS TO BE SEEN BEFORE NEXT REFILL) (Patient not taking: Reported on 07/08/2021)   torsemide (DEMADEX) 20 MG tablet Take 2 tablets (40 mg total) by mouth daily. (Patient taking differently: Take 60 mg by mouth daily.)   No facility-administered encounter medications on file as of 07/23/2021.    No past surgical history on file.  Family History  Problem Relation Age of Onset   Heart disease Mother    Diabetes Mother    Cancer Mother    Aneurysm Father    Hypertension Father  Hypertension Brother       Controlled substance contract: n/a     Review of Systems  Constitutional:  Negative for diaphoresis.  Eyes:  Negative for pain.  Respiratory:  Negative for shortness of breath.   Cardiovascular:  Negative for chest pain, palpitations and leg swelling.  Gastrointestinal:  Negative for abdominal pain.  Endocrine: Negative for polydipsia.  Skin:  Negative for rash.  Neurological:  Negative for dizziness, weakness and headaches.  Hematological:  Does not bruise/bleed easily.  All other systems reviewed and are negative.     Objective:   Physical Exam Vitals and nursing note reviewed.  Constitutional:      General: She  is not in acute distress.    Appearance: Normal appearance. She is well-developed.  HENT:     Head: Normocephalic.     Right Ear: Tympanic membrane normal.     Left Ear: Tympanic membrane normal.     Nose: Nose normal.     Mouth/Throat:     Mouth: Mucous membranes are moist.  Eyes:     Pupils: Pupils are equal, round, and reactive to light.  Neck:     Vascular: No carotid bruit or JVD.  Cardiovascular:     Rate and Rhythm: Normal rate and regular rhythm.     Heart sounds: Murmur (3/6 systolic) heard.  Pulmonary:     Effort: Pulmonary effort is normal. No respiratory distress.     Breath sounds: Normal breath sounds. No wheezing or rales.  Chest:     Chest wall: No tenderness.  Abdominal:     General: Bowel sounds are normal. There is no distension or abdominal bruit.     Palpations: Abdomen is soft. There is no hepatomegaly, splenomegaly, mass or pulsatile mass.     Tenderness: There is no abdominal tenderness.  Musculoskeletal:        General: Normal range of motion.     Cervical back: Normal range of motion and neck supple.  Lymphadenopathy:     Cervical: No cervical adenopathy.  Skin:    General: Skin is warm and dry.  Neurological:     Mental Status: She is alert and oriented to person, place, and time.     Deep Tendon Reflexes: Reflexes are normal and symmetric.  Psychiatric:        Behavior: Behavior normal.        Thought Content: Thought content normal.        Judgment: Judgment normal.    BP (!) 152/78   Pulse (!) 50   Temp 97.9 F (36.6 C) (Temporal)   Resp 20   Ht '5\' 9"'$  (1.753 m)   Wt 226 lb (102.5 kg)   SpO2 98%   BMI 33.37 kg/m         Assessment & Plan:  Lori Ortiz comes in today with chief complaint of Medical Management of Chronic Issues   Diagnosis and orders addressed:  1. Primary hypertension Low sodium diet - losartan (COZAAR) 100 MG tablet; Take 1 tablet (100 mg total) by mouth daily.  Dispense: 90 tablet; Refill: 1 - amLODipine  (NORVASC) 5 MG tablet; Take 1 tablet (5 mg total) by mouth daily.  Dispense: 30 tablet; Refill: 11  2. Mixed hyperlipidemia Low fat diet - atorvastatin (LIPITOR) 40 MG tablet; Take 1 tablet (40 mg total) by mouth daily.  Dispense: 90 tablet; Refill: 1  3. Permanent atrial fibrillation ([place]) Keep follow up with cardiology  4. Acute on chronic systolic congestive heart failure ([place])  Again- keep follow up with cardiology - isosorbide dinitrate (ISORDIL) 10 MG tablet; Take 2 tablets (20 mg total) by mouth 3 (three) times daily.  Dispense: 180 tablet; Refill: 11  5. Gastroesophageal reflux disease, unspecified whether esophagitis present Avoid spicy foods Do not eat 2 hours prior to bedtime - pantoprazole (PROTONIX) 40 MG tablet; Take 1 tablet (40 mg total) by mouth daily. (NEEDS TO BE SEEN BEFORE NEXT REFILL)  Dispense: 90 tablet; Refill: 1  6. 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 Morbid obesity ([place]) Discussed diet and exercise for person with BMI >25 Will recheck weight in 3-6 months   8. Encounter for screening colonoscopy Referral made - Ambulatory referral to Gastroenterology  9. Acute congestive heart failure, unspecified heart failure type (HCC) - torsemide (DEMADEX) 20 MG tablet; Take 2 tablets (40 mg total) by mouth daily.  Dispense: 180 tablet; Refill: 1   Labs pending Health Maintenance reviewed Diet and exercise encouraged  Follow up plan: 6 months   Mary-Margaret Hassell Done, FNP

## 2021-07-24 ENCOUNTER — Other Ambulatory Visit: Payer: Self-pay | Admitting: Physician Assistant

## 2021-07-24 ENCOUNTER — Other Ambulatory Visit (HOSPITAL_COMMUNITY): Payer: Self-pay | Admitting: Physician Assistant

## 2021-07-24 DIAGNOSIS — Z01812 Encounter for preprocedural laboratory examination: Secondary | ICD-10-CM

## 2021-07-24 DIAGNOSIS — I10 Essential (primary) hypertension: Secondary | ICD-10-CM

## 2021-07-24 NOTE — H&P (Signed)
Chief Complaint: Patient was seen in consultation today for random renal biopsy at the request of [place] S  Referring Physician(s): Saco S  Supervising Physician: Arne Cleveland  Patient Status: [facility] Out[facility]  History of Present Illness: Lori Ortiz is a 66 y.o. 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 Pt followed by Dr. Ulice Bold for CKD. Pt was referred to IR for random renal biopsy d/t CKD and nephrotic range proteinuria.   Past Medical History:  Diagnosis Date   GERD (gastroesophageal reflux disease)    Hypertension    Stroke ([place])     No past surgical history on file.  Allergies: Patient has no known allergies.  Medications: Prior to Admission medications   Medication Sig Start Date End Date Taking? Authorizing Provider  Cholecalciferol (VITAMIN D) 50 MCG (2000 UT) tablet Take 2,000 Units by mouth daily.   Yes [provider]  hydrALAZINE (APRESOLINE) 25 MG tablet Take 1 tablet (25 mg total) by mouth 3 (three) times daily. Patient taking differently: Take 50 mg by mouth 3 (three) times daily. 01/22/21  Yes Martin, Mary-Margaret, FNP  MELATONIN PO Take 1 capsule by mouth at bedtime as needed (sleep). With elderberry and zinc   Yes [provider]  amLODipine (NORVASC) 5 MG tablet Take 1 tablet (5 mg total) by mouth daily. 07/23/21 07/23/22  Hassell Done Mary-Margaret, FNP  aspirin EC 81 MG tablet Take 81 mg by mouth daily. Swallow whole.    [provider]  atorvastatin (LIPITOR) 40 MG tablet Take 1 tablet (40 mg total) by mouth daily. 07/23/21   Hassell Done, Mary-Margaret, FNP  ferrous sulfate 325 (65 FE) MG EC tablet Take 1 tablet (325 mg total) by mouth daily. 07/23/21   Hassell Done, Mary-Margaret, FNP  isosorbide dinitrate (ISORDIL) 10 MG tablet Take 2 tablets (20 mg total) by mouth 3 (three) times daily. 07/23/21 07/23/22  Hassell Done, Mary-Margaret, FNP   losartan (COZAAR) 100 MG tablet Take 1 tablet (100 mg total) by mouth daily. 07/23/21   Hassell Done, Mary-Margaret, FNP  pantoprazole (PROTONIX) 40 MG tablet Take 1 tablet (40 mg total) by mouth daily. (NEEDS TO BE SEEN BEFORE NEXT REFILL) 07/23/21   Chevis Pretty, FNP  torsemide (DEMADEX) 20 MG tablet Take 2 tablets (40 mg total) by mouth daily. 07/23/21   Chevis Pretty, FNP     Family History  Problem Relation Age of Onset   Heart disease Mother    Diabetes Mother    Cancer Mother    Aneurysm Father    Hypertension Father    Hypertension Brother     Social History   Socioeconomic History   Marital status: Single    Spouse name: Not on file   Number of children: Not on file   Years of education: Not on file   Highest education level: Not on file  Occupational History   Not on file  Tobacco Use   Smoking status: Every Day    Packs/day: 0.50    Types: Cigarettes   Smokeless tobacco: Never  Substance and Sexual Activity   Alcohol use: No   Drug use: No   Sexual activity: Not on file  Other Topics Concern   Not on file  Social History Narrative   Not on file   Social Determinants of Health   Financial Resource Strain: Not on file  Food Insecurity: Not on file  Transportation Needs: Not on file  Physical Activity: Not on  file  Stress: Not on file  Social Connections: Not on file    Review of Systems: A 12 point ROS discussed and pertinent positives are indicated in the HPI above.  All other systems are negative.  Review of Systems  Vital Signs: There were no vitals taken for this visit.  Physical Exam  Imaging: No results found.  Labs:  CBC: Recent Labs    01/22/21 0952 07/04/21 1003  WBC 5.9 4.6  HGB 11.0* 10.8*  HCT 33.6* 32.0*  PLT 222 199    COAGS: No results for input(s): INR, APTT in the last 8760 hours.  BMP: Recent Labs    01/22/21 0952 07/04/21 1003  NA 145* 145*  K 4.9 4.5  CL 111* 104  CO2 22 26  GLUCOSE 85 99   BUN 30* 32*  CALCIUM 9.4 9.6  CREATININE 2.06* 3.14*    LIVER FUNCTION TESTS: Recent Labs    01/22/21 0952 07/04/21 1003  BILITOT 0.6 0.4  AST 16 46*  ALT 14 49*  ALKPHOS 120 131*  PROT 6.2 6.6  ALBUMIN 3.9 4.1    TUMOR MARKERS: No results for input(s): AFPTM, CEA, CA199, CHROMGRNA in the last 8760 hours.  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 Pt followed by Dr. Ulice Bold for CKD. Pt was referred to IR for random renal biopsy d/t CKD and nephrotic range proteinuria.   Risks and benefits of random renal biopsy with moderate sedation was discussed with the patient and/or patient's family including, but not limited to bleeding, infection, damage to adjacent structures or low yield requiring additional tests.  All of the questions were answered and there is agreement to proceed.  Consent signed and in chart.   Thank you for this interesting consult.  I greatly enjoyed meeting Lori Ortiz and look forward to participating in their care.  A copy of this report was sent to the requesting provider on this date.  Electronically Signed: Tyson Alias, NP 07/24/2021, 3:46 PM   I spent a total of 20 minutes in face to face in clinical consultation, greater than 50% of which was counseling/coordinating care for random renal biopsy.

## 2021-07-25 ENCOUNTER — Ambulatory Visit (HOSPITAL_COMMUNITY)
Admission: RE | Admit: 2021-07-25 | Discharge: 2021-07-25 | Disposition: A | Discharge status: Home / Self Care | Payer: PRIVATE HEALTH INSURANCE | Source: Ambulatory Visit | Attending: Nephrology | Admitting: Nephrology

## 2021-07-25 ENCOUNTER — Other Ambulatory Visit (HOSPITAL_COMMUNITY): Payer: Self-pay | Admitting: Nephrology

## 2021-07-25 ENCOUNTER — Other Ambulatory Visit: Payer: Self-pay

## 2021-07-25 ENCOUNTER — Encounter (HOSPITAL_COMMUNITY): Payer: Self-pay

## 2021-07-25 DIAGNOSIS — N184 Chronic kidney disease, stage 4 (severe): Secondary | ICD-10-CM | POA: Diagnosis present

## 2021-07-25 DIAGNOSIS — Z01812 Encounter for preprocedural laboratory examination: Secondary | ICD-10-CM | POA: Insufficient documentation

## 2021-07-25 LAB — PROTIME-INR
INR: 1.1 (ref 0.8–1.2)
Prothrombin Time: 13.7 seconds (ref 11.4–15.2)

## 2021-07-25 IMAGING — US CT BIOPSY CORE RENAL
1 series · 3 of 3 positions shown · non-contrast
Comparison: none

INDICATION: Chronic kidney disease stage 4

[Series 1: us biopsy (kidney) · 3 of 3 slices shown]
[im 1/3]
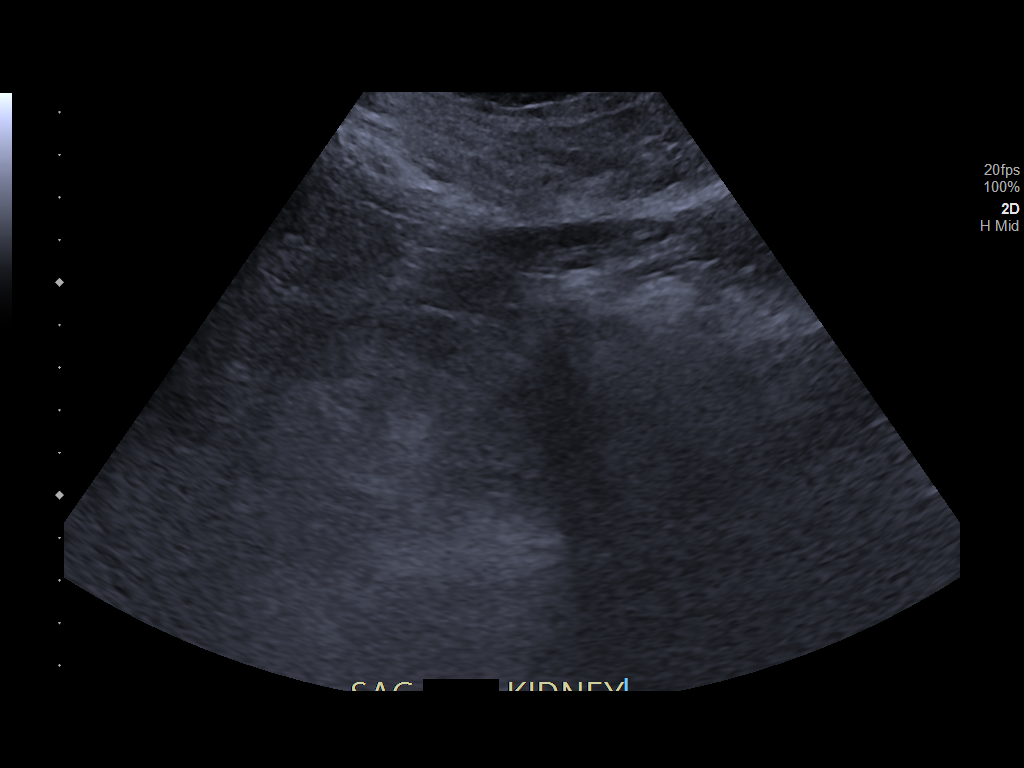
[im 2/3]
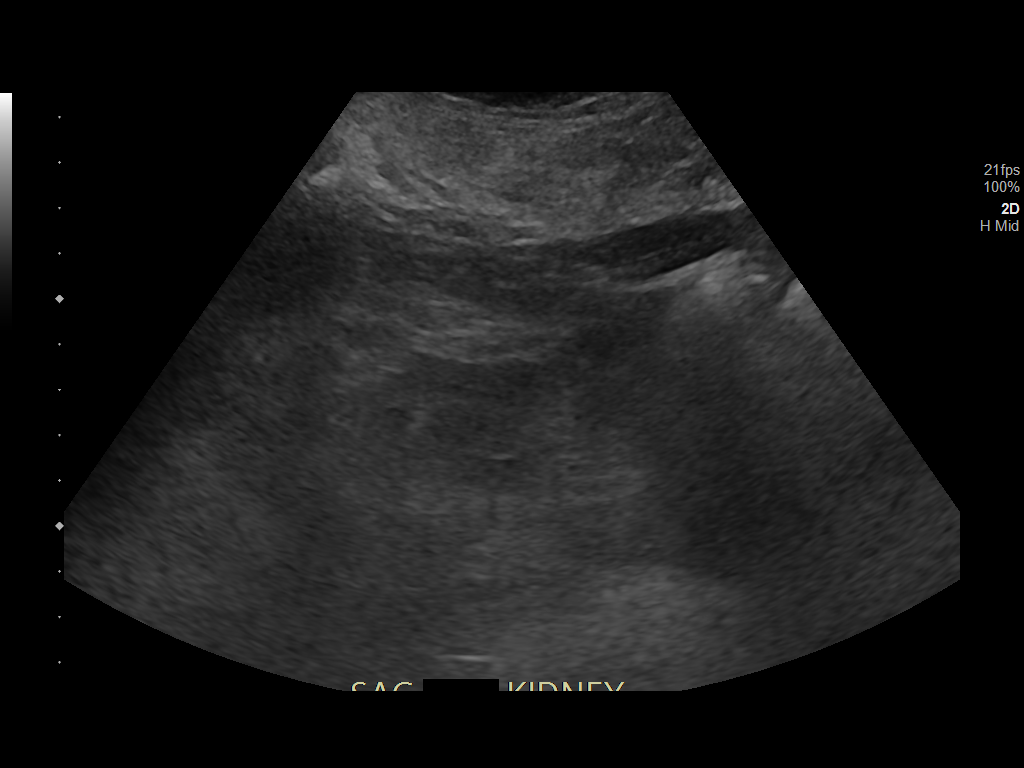
[im 3/3]
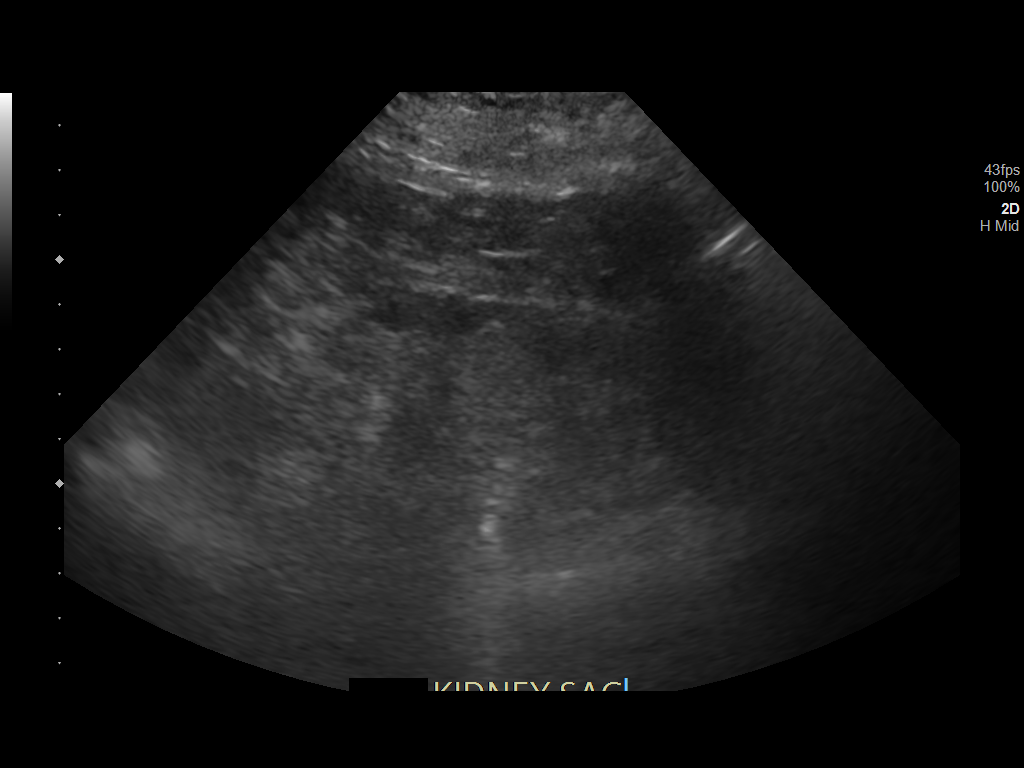

[3 of 3 positions shown; findings below may reference images not displayed]

EXAM:
CT-GUIDED CORE RENAL BIOPSY

PROCEDURE:
Initially, ultrasound was utilized to assess both kidneys for
guidance of core biopsy. However, because of the small kidney size
and echogenic parenchyma in the setting of patient body habitus,
patient was transferred to CT for more accurate imaging guidance.
Multidetector CT imaging of the abdomen was performed following the
standard protocol without IV contrast.

An appropriate skin entry site was determined and marked. Region
prepped with chlorhexidine, draped in usual sterile fashion,
infiltrated locally with 1% lidocaine.

Under intermittent CT fluoroscopic guidance, 15 gauge trocar needle
was advanced to the margin of the lower pole left kidney. Once
needle tip position was confirmed, 3 solid-appearing coaxial 16
gauge core biopsy samples were obtained, submitted to surgical
pathology as per routine. Postprocedure scan shows only minimal
regional hemorrhage. The patient tolerated the procedure well.

RADIATION DOSE REDUCTION: This exam was performed according to the
departmental dose-optimization program which includes automated
exposure control, adjustment of the mA and/or kV according to
patient size and/or use of iterative reconstruction technique.

MEDICATIONS:
Lidocaine 1% subcutaneous

ANESTHESIA/SEDATION:
Intravenous Fentanyl [9N] and Versed 1mg were administered as
conscious sedation during continuous monitoring of the patient's
level of consciousness and physiological / cardiorespiratory status
by the radiology RN, with a total moderate sedation time of 12
minutes.

COMPLICATIONS:
None immediate.
IMPRESSION: 1. Technically successful CT-guided random renal core biopsy.

## 2021-07-25 MED ORDER — MIDAZOLAM HCL 2 MG/2ML IJ SOLN
INTRAMUSCULAR | Status: AC
  Filled 2021-07-25: qty 2

## 2021-07-25 MED ORDER — MIDAZOLAM HCL 2 MG/2ML IJ SOLN
INTRAMUSCULAR | Status: AC | PRN
  Administered 2021-07-25: .5 mg via INTRAVENOUS

## 2021-07-25 MED ORDER — HYDROCODONE-ACETAMINOPHEN 5-325 MG PO TABS: 1.0000 | ORAL_TABLET | ORAL | Status: DC | PRN

## 2021-07-25 MED ORDER — SODIUM CHLORIDE 0.9 % IV SOLN: INTRAVENOUS | Status: DC

## 2021-07-25 MED ORDER — FENTANYL CITRATE (PF) 100 MCG/2ML IJ SOLN
INTRAMUSCULAR | Status: AC
  Filled 2021-07-25: qty 2

## 2021-07-25 MED ORDER — LIDOCAINE HCL (PF) 1 % IJ SOLN
INTRAMUSCULAR | Status: AC
  Filled 2021-07-25: qty 30

## 2021-07-25 MED ORDER — FENTANYL CITRATE (PF) 100 MCG/2ML IJ SOLN
INTRAMUSCULAR | Status: AC | PRN
  Administered 2021-07-25: 25 ug via INTRAVENOUS

## 2021-07-25 MED ORDER — HYDRALAZINE HCL 20 MG/ML IJ SOLN
10.0000 mg | Freq: Once | INTRAMUSCULAR | Status: AC
  Administered 2021-07-25: 10 mg via INTRAVENOUS
  Filled 2021-07-25: qty 1

## 2021-07-25 NOTE — Progress Notes (Signed)
Patient and son was given discharge instructions. Both verbalized understanding. 

## 2021-07-25 NOTE — Procedures (Signed)
  Procedure:  CT core biopsy LLP renal   Preprocedure diagnosis: Renal insufficiency Postprocedure diagnosis: same EBL:    minimal Complications:   none immediate  See full dictation in [facility].  Dillard Cannon MD Main # [phone] Pager  [phone] Mobile [phone]

## 2021-08-01 ENCOUNTER — Encounter (HOSPITAL_COMMUNITY): Payer: Self-pay

## 2021-08-01 LAB — SURGICAL PATHOLOGY

## 2021-08-05 ENCOUNTER — Ambulatory Visit: Payer: PRIVATE HEALTH INSURANCE | Admitting: Nurse Practitioner

## 2021-08-05 ENCOUNTER — Encounter: Payer: Self-pay | Admitting: Nurse Practitioner

## 2021-08-05 VITALS — BP 131/58 | HR 51 | Temp 97.1°F | Resp 20 | Ht 69.0 in | Wt 217.0 lb

## 2021-08-05 DIAGNOSIS — I10 Essential (primary) hypertension: Secondary | ICD-10-CM

## 2021-08-05 NOTE — Patient Instructions (Signed)

## 2021-08-05 NOTE — Progress Notes (Signed)
   Subjective:    Patient ID: Lori Ortiz, female    DOB: May 23, 1955, 66 y.o.   MRN: [id number]   Chief Complaint: Hypertension   Hypertension Pertinent negatives include no chest pain, headaches, palpitations or shortness of breath.   Patient comes in today for follow up of blood pressure she is having kidney issues and recently had bx performed. She has not been informed of results yet. But this is probably making her blood pressure higher.  BP Readings from Last 3 Encounters:  08/05/21 (!) 131/58  07/25/21 (!) 160/54  07/25/21 (!) 164/62       Review of Systems  Constitutional:  Negative for diaphoresis.  Eyes:  Negative for pain.  Respiratory:  Negative for shortness of breath.   Cardiovascular:  Negative for chest pain, palpitations and leg swelling.  Gastrointestinal:  Negative for abdominal pain.  Endocrine: Negative for polydipsia.  Skin:  Negative for rash.  Neurological:  Negative for dizziness, weakness and headaches.  Hematological:  Does not bruise/bleed easily.  All other systems reviewed and are negative.      Objective:   Physical Exam Vitals reviewed.  Constitutional:      Appearance: Normal appearance. She is obese.  Cardiovascular:     Rate and Rhythm: Normal rate and regular rhythm.     Heart sounds: Murmur (2/6 systolic) heard.  Pulmonary:     Effort: Pulmonary effort is normal.     Breath sounds: Normal breath sounds.  Skin:    General: Skin is warm.  Neurological:     General: No focal deficit present.     Mental Status: She is alert and oriented to person, place, and time.  Psychiatric:        Mood and Affect: Mood normal.        Behavior: Behavior normal.     BP (!) 131/58   Pulse (!) 51   Temp (!) 97.1 F (36.2 C) (Temporal)   Resp 20   Ht '5\' 9"'$  (1.753 m)   Wt 217 lb (98.4 kg)   SpO2 97%   BMI 32.05 kg/m        Assessment & Plan:  [name] in today with chief complaint of Hypertension   1. Primary  hypertension Continue all current meds Keep follow up with specialist about kidney test Dash diet Follow up as scheduled    The above assessment and management plan was discussed with the patient. The patient verbalized understanding of and has agreed to the management plan. Patient is aware to call the clinic if symptoms persist or worsen. Patient is aware when to return to the clinic for a follow-up visit. Patient educated on when it is appropriate to go to the emergency department.   Mary-Margaret Hassell Done, FNP

## 2021-08-06 ENCOUNTER — Other Ambulatory Visit (HOSPITAL_COMMUNITY)
Admission: RE | Admit: 2021-08-06 | Discharge: 2021-08-06 | Disposition: A | Discharge status: Home / Self Care | Payer: PRIVATE HEALTH INSURANCE | Source: Ambulatory Visit | Attending: Nephrology | Admitting: Nephrology

## 2021-08-06 DIAGNOSIS — N184 Chronic kidney disease, stage 4 (severe): Secondary | ICD-10-CM | POA: Insufficient documentation

## 2021-08-06 LAB — RENAL FUNCTION PANEL
Albumin: 3.7 g/dL (ref 3.5–5.0)
Anion gap: 6 (ref 5–15)
BUN: 43 mg/dL — ABNORMAL HIGH (ref 8–23)
CO2: 25 mmol/L (ref 22–32)
Calcium: 9.2 mg/dL (ref 8.9–10.3)
Chloride: 107 mmol/L (ref 98–111)
Creatinine, Ser: 3.01 mg/dL — ABNORMAL HIGH (ref 0.44–1.00)
GFR, Estimated: 16 mL/min — ABNORMAL LOW (ref >60)
Glucose, Bld: 102 mg/dL — ABNORMAL HIGH (ref 70–99)
Phosphorus: 5.1 mg/dL — ABNORMAL HIGH (ref 2.5–4.6)
Potassium: 4.1 mmol/L (ref 3.5–5.1)
Sodium: 138 mmol/L (ref 135–145)

## 2021-08-06 LAB — FERRITIN: Ferritin: 145 ng/mL (ref 11–307)

## 2021-08-06 LAB — IRON AND TIBC
Iron: 59 ug/dL (ref 28–170)
Saturation Ratios: 20 % (ref 10.4–31.8)
TIBC: 298 ug/dL (ref 250–450)
UIBC: 239 ug/dL

## 2021-08-06 LAB — CBC
HCT: 31.1 % — ABNORMAL LOW (ref 36.0–46.0)
Hemoglobin: 10 g/dL — ABNORMAL LOW (ref 12.0–15.0)
MCH: 30.5 pg (ref 26.0–34.0)
MCHC: 32.2 g/dL (ref 30.0–36.0)
MCV: 94.8 fL (ref 80.0–100.0)
Platelets: 271 10*3/uL (ref 150–400)
RBC: 3.28 MIL/uL — ABNORMAL LOW (ref 3.87–5.11)
RDW: 13.3 % (ref 11.5–15.5)
WBC: 6.9 10*3/uL (ref 4.0–10.5)
nRBC: 0 % (ref 0.0–0.2)

## 2021-08-06 LAB — PROTEIN / CREATININE RATIO, URINE
Creatinine, Urine: 57.94 mg/dL
Protein Creatinine Ratio: 4.45 mg/mg{Cre} — ABNORMAL HIGH (ref 0.00–0.15)
Total Protein, Urine: 258 mg/dL

## 2021-08-06 LAB — VITAMIN D 25 HYDROXY (VIT D DEFICIENCY, FRACTURES): Vit D, 25-Hydroxy: 29.73 ng/mL — ABNORMAL LOW (ref 30–100)

## 2021-08-07 LAB — PTH, INTACT AND CALCIUM
Calcium, Total (PTH): 9.2 mg/dL (ref 8.7–10.3)
PTH: 99 pg/mL — ABNORMAL HIGH (ref 15–65)

## 2021-08-08 LAB — PROTEIN ELECTROPHORESIS, SERUM
A/G Ratio: 1.1 (ref 0.7–1.7)
Albumin ELP: 3.4 g/dL (ref 2.9–4.4)
Alpha-1-Globulin: 0.3 g/dL (ref 0.0–0.4)
Alpha-2-Globulin: 0.7 g/dL (ref 0.4–1.0)
Beta Globulin: 0.8 g/dL (ref 0.7–1.3)
Gamma Globulin: 1.2 g/dL (ref 0.4–1.8)
Globulin, Total: 3 g/dL (ref 2.2–3.9)
Total Protein ELP: 6.4 g/dL (ref 6.0–8.5)

## 2021-08-09 LAB — IMMUNOFIXATION ELECTROPHORESIS
IgA: <5 mg/dL — ABNORMAL LOW (ref 87–352)
IgG (Immunoglobin G), Serum: 1240 mg/dL (ref 586–1602)
IgM (Immunoglobulin M), Srm: 97 mg/dL (ref 26–217)
Total Protein ELP: 6.4 g/dL (ref 6.0–8.5)

## 2021-09-05 DIAGNOSIS — G4733 Obstructive sleep apnea (adult) (pediatric): Secondary | ICD-10-CM | POA: Diagnosis not present

## 2021-09-16 ENCOUNTER — Other Ambulatory Visit: Payer: Self-pay | Admitting: Nurse Practitioner

## 2021-09-16 ENCOUNTER — Encounter: Payer: Self-pay | Admitting: Nurse Practitioner

## 2021-09-16 ENCOUNTER — Ambulatory Visit
Admission: RE | Admit: 2021-09-16 | Discharge: 2021-09-16 | Disposition: A | Discharge status: Home / Self Care | Payer: Medicare Other | Source: Ambulatory Visit | Attending: Nurse Practitioner | Admitting: Nurse Practitioner

## 2021-09-16 DIAGNOSIS — Z1231 Encounter for screening mammogram for malignant neoplasm of breast: Secondary | ICD-10-CM

## 2021-09-17 DIAGNOSIS — E559 Vitamin D deficiency, unspecified: Secondary | ICD-10-CM | POA: Diagnosis not present

## 2021-09-17 DIAGNOSIS — R808 Other proteinuria: Secondary | ICD-10-CM | POA: Diagnosis not present

## 2021-09-17 DIAGNOSIS — I129 Hypertensive chronic kidney disease with stage 1 through stage 4 chronic kidney disease, or unspecified chronic kidney disease: Secondary | ICD-10-CM | POA: Diagnosis not present

## 2021-09-17 DIAGNOSIS — E211 Secondary hyperparathyroidism, not elsewhere classified: Secondary | ICD-10-CM | POA: Diagnosis not present

## 2021-09-17 DIAGNOSIS — I5032 Chronic diastolic (congestive) heart failure: Secondary | ICD-10-CM | POA: Diagnosis not present

## 2021-09-24 DIAGNOSIS — G4733 Obstructive sleep apnea (adult) (pediatric): Secondary | ICD-10-CM | POA: Diagnosis not present

## 2021-09-26 DIAGNOSIS — Z8679 Personal history of other diseases of the circulatory system: Secondary | ICD-10-CM | POA: Diagnosis not present

## 2021-09-26 DIAGNOSIS — N19 Unspecified kidney failure: Secondary | ICD-10-CM | POA: Diagnosis not present

## 2021-09-26 DIAGNOSIS — D638 Anemia in other chronic diseases classified elsewhere: Secondary | ICD-10-CM | POA: Diagnosis not present

## 2021-09-26 DIAGNOSIS — R42 Dizziness and giddiness: Secondary | ICD-10-CM | POA: Diagnosis not present

## 2021-09-26 DIAGNOSIS — I952 Hypotension due to drugs: Secondary | ICD-10-CM | POA: Diagnosis not present

## 2021-09-26 DIAGNOSIS — E861 Hypovolemia: Secondary | ICD-10-CM | POA: Diagnosis not present

## 2021-09-26 DIAGNOSIS — I129 Hypertensive chronic kidney disease with stage 1 through stage 4 chronic kidney disease, or unspecified chronic kidney disease: Secondary | ICD-10-CM | POA: Diagnosis not present

## 2021-09-26 DIAGNOSIS — R808 Other proteinuria: Secondary | ICD-10-CM | POA: Diagnosis not present

## 2021-09-26 DIAGNOSIS — I5032 Chronic diastolic (congestive) heart failure: Secondary | ICD-10-CM | POA: Diagnosis not present

## 2021-09-26 DIAGNOSIS — N170 Acute kidney failure with tubular necrosis: Secondary | ICD-10-CM | POA: Diagnosis not present

## 2021-09-30 DIAGNOSIS — I10 Essential (primary) hypertension: Secondary | ICD-10-CM | POA: Diagnosis not present

## 2021-09-30 DIAGNOSIS — E785 Hyperlipidemia, unspecified: Secondary | ICD-10-CM | POA: Diagnosis not present

## 2021-09-30 DIAGNOSIS — I350 Nonrheumatic aortic (valve) stenosis: Secondary | ICD-10-CM | POA: Diagnosis not present

## 2021-09-30 DIAGNOSIS — I4821 Permanent atrial fibrillation: Secondary | ICD-10-CM | POA: Diagnosis not present

## 2021-10-06 DIAGNOSIS — G4733 Obstructive sleep apnea (adult) (pediatric): Secondary | ICD-10-CM | POA: Diagnosis not present

## 2021-10-13 NOTE — Progress Notes (Unsigned)
10/13/2021 Lori Ortiz [id number] [birth year]   CHIEF COMPLAINT:   HISTORY OF PRESENT ILLNESS: Lori Ortiz is a 66 year old female with a past medical history of hypertension, chronic diastolic CHF, CVA, nephrotic syndrome, anemia, GERD  She presents to our office today as referred by Lori Pretty NP to schedule a screening colonoscopy.   Hospital admissions Patient was admitted from May 1 till Jun 27, 2021 with chief complaint of Acute kidney injury Nephrotic range proteinuria Acute on chronic diastolic CHF Atrial fibrillation Dyslipidemia anasarca  Patient was admitted from June 30 till August 25, 2019 with chief complaint of Persistent atrial fibrillation Watchman left atrial bandage closure device     Latest Ref Rng & Units 08/06/2021    4:04 PM 07/04/2021   10:03 AM 01/22/2021    9:52 AM  CBC  WBC 4.0 - 10.5 K/uL 6.9  4.6  5.9   Hemoglobin 12.0 - 15.0 g/dL 10.0  10.8  11.0   Hematocrit 36.0 - 46.0 % 31.1  32.0  33.6   Platelets 150 - 400 K/uL 271  199  222    MCV 94.8.      Latest Ref Rng & Units 08/06/2021    4:04 PM 07/04/2021   10:03 AM 01/22/2021    9:52 AM  CMP  Glucose 70 - 99 mg/dL 102  99  85   BUN 8 - 23 mg/dL 43  32  30   Creatinine 0.44 - 1.00 mg/dL 3.01  3.14  2.06   Sodium 135 - 145 mmol/L 138  145  145   Potassium 3.5 - 5.1 mmol/L 4.1  4.5  4.9   Chloride 98 - 111 mmol/L 107  104  111   CO2 22 - 32 mmol/L '25  26  22   '$ Calcium 8.9 - 10.3 mg/dL 8.7 - 10.3 mg/dL 9.2    9.2  9.6  9.4   Total Protein 6.0 - 8.5 g/dL  6.6  6.2   Total Bilirubin 0.0 - 1.2 mg/dL  0.4  0.6   Alkaline Phos 44 - 121 IU/L  131  120   AST 0 - 40 IU/L  46  16   ALT 0 - 32 IU/L  49  14    Iron 59. TIBC 298. Labs 07/02/2021: Iron 19. Iron saturation 6%.     Past Medical History:  Diagnosis Date   GERD (gastroesophageal reflux disease)    Hypertension    Stroke ([place])    No past surgical history on file. Social History:  Family History:    reports that she  has been smoking cigarettes. She has been smoking an average of .5 packs per day. She has never used smokeless tobacco. She reports that she does not drink alcohol and does not use drugs. family history includes Aneurysm in her father; Cancer in her mother; Diabetes in her mother; Heart disease in her mother; Hypertension in her brother and father.  No Known Allergies    Outpatient Encounter Medications as of 10/14/2021  Medication Sig   amLODipine (NORVASC) 5 MG tablet Take 1 tablet (5 mg total) by mouth daily.   aspirin EC 81 MG tablet Take 81 mg by mouth daily. Swallow whole.   atorvastatin (LIPITOR) 40 MG tablet Take 1 tablet (40 mg total) by mouth daily.   Cholecalciferol (VITAMIN D) 50 MCG (2000 UT) tablet Take 2,000 Units by mouth daily.   ferrous sulfate 325 (65 FE) MG EC tablet Take 1 tablet (325  mg total) by mouth daily.   hydrALAZINE (APRESOLINE) 25 MG tablet Take 1 tablet (25 mg total) by mouth 3 (three) times daily. (Patient taking differently: Take 50 mg by mouth 3 (three) times daily.)   isosorbide dinitrate (ISORDIL) 10 MG tablet Take 2 tablets (20 mg total) by mouth 3 (three) times daily.   losartan (COZAAR) 100 MG tablet Take 1 tablet (100 mg total) by mouth daily.   MELATONIN PO Take 1 capsule by mouth at bedtime as needed (sleep). With elderberry and zinc   pantoprazole (PROTONIX) 40 MG tablet Take 1 tablet (40 mg total) by mouth daily. (NEEDS TO BE SEEN BEFORE NEXT REFILL)   torsemide (DEMADEX) 20 MG tablet Take 2 tablets (40 mg total) by mouth daily.   No facility-administered encounter medications on file as of 10/14/2021.     REVIEW OF SYSTEMS:  Gen: Denies fever, sweats or chills. No weight loss.  CV: Denies chest pain, palpitations or edema. Resp: Denies cough, shortness of breath of hemoptysis.  GI: Denies heartburn, dysphagia, stomach or lower abdominal pain. No diarrhea or constipation.  GU : Denies urinary burning, blood in urine, increased urinary frequency  or incontinence. MS: Denies joint pain, muscles aches or weakness. Derm: Denies rash, itchiness, skin lesions or unhealing ulcers. Psych: Denies depression, anxiety, memory loss, suicidal ideation and confusion. Heme: Denies bruising, easy bleeding. Neuro:  Denies headaches, dizziness or paresthesias. Endo:  Denies any problems with DM, thyroid or adrenal function.  PHYSICAL EXAM: There were no vitals taken for this visit. General: Well developed ... in no acute distress. Head: Normocephalic and atraumatic. Eyes:  Sclerae non-icteric, conjunctive pink. Ears: Normal auditory acuity. Mouth: Dentition intact. No ulcers or lesions.  Neck: Supple, no lymphadenopathy or thyromegaly.  Lungs: Clear bilaterally to auscultation without wheezes, crackles or rhonchi. Heart: Regular rate and rhythm. No murmur, rub or gallop appreciated.  Abdomen: Soft, nontender, non distended. No masses. No hepatosplenomegaly. Normoactive bowel sounds x 4 quadrants.  Rectal:  Musculoskeletal: Symmetrical with no gross deformities. Skin: Warm and dry. No rash or lesions on visible extremities. Extremities: No edema. Neurological: Alert oriented x 4, no focal deficits.  Psychological:  Alert and cooperative. Normal mood and affect.  ASSESSMENT AND PLAN:  Colon cancer screening   GERD  Anemia secondary to CKD and iron deficiency. On Ferrous Sulfate  Elevated LFTs   CC:  Hassell Done, Mary-Margaret, *

## 2021-10-14 ENCOUNTER — Encounter: Payer: Self-pay | Admitting: Nurse Practitioner

## 2021-10-14 ENCOUNTER — Telehealth: Payer: Self-pay

## 2021-10-14 ENCOUNTER — Ambulatory Visit: Payer: Medicare Other | Admitting: Nurse Practitioner

## 2021-10-14 VITALS — BP 148/72 | HR 58 | Ht 69.0 in | Wt 211.0 lb

## 2021-10-14 DIAGNOSIS — D508 Other iron deficiency anemias: Secondary | ICD-10-CM

## 2021-10-14 DIAGNOSIS — Z86010 Personal history of colonic polyps: Secondary | ICD-10-CM

## 2021-10-14 DIAGNOSIS — N189 Chronic kidney disease, unspecified: Secondary | ICD-10-CM

## 2021-10-14 DIAGNOSIS — D649 Anemia, unspecified: Secondary | ICD-10-CM

## 2021-10-14 DIAGNOSIS — D509 Iron deficiency anemia, unspecified: Secondary | ICD-10-CM | POA: Insufficient documentation

## 2021-10-14 NOTE — Patient Instructions (Addendum)
If you are age 67 or older, your body mass index should be between 23-30. Your Body mass index is 31.16 kg/m. If this is out of the aforementioned range listed, please consider follow up with your Primary Care Provider. ________________________________________________________  The [facility] providers would like to encourage you to use [facility] to communicate with providers for non-urgent requests or questions.  Due to long hold times on the telephone, sending your provider a message by [facility] may be a faster and more efficient way to get a response.  Please allow 48 business hours for a response.  Please remember that this is for non-urgent requests.  _______________________________________________________  We will be requesting clearance from Nephrology for you to proceed with having a Colonoscopy and possible Endoscopy.   Carl Best will also be discussing your case with Dr. Havery Moros. We will discuss the location of your procedure.  Thank you for entrusting me with your care and choosing [facility].  Esmond Plants, NP-C

## 2021-10-14 NOTE — Progress Notes (Signed)
Agree with assessment and plan as outlined. Given her comorbidities I agree her case should be done at the hospital.  Unfortunately we have a roughly 51-monthbacklog for hospital availability for anesthesia assistance for her case.  Given her iron deficiency she warrants an EGD in addition to colonoscopy.  Fortunately her iron studies look improved more recently.  I agree with obtaining clearance from nephrology/cardiology prior to proceeding.  She will be added to the wait list for scheduling.  If she has any overt bleeding or worsening anemia in the interim she needs to let uKoreaknow.  Thanks

## 2021-10-14 NOTE — Telephone Encounter (Signed)
Letter sent to Dr. Theador Hawthorne with [facility] for clearance for patient to schedule for Colonoscopy/Endoscopy.

## 2021-10-15 NOTE — Progress Notes (Signed)
Lori Ortiz, please contact patient's cardiologist and send a formal request for a cardiac clearance which is required prior to the patient proceeding with an EGD and colonoscopy with Dr. Havery Moros at [facility].  THX.

## 2021-10-16 ENCOUNTER — Telehealth: Payer: Self-pay

## 2021-10-16 NOTE — Telephone Encounter (Signed)
    Primary Cardiologist:None  Chart reviewed as part of pre-operative protocol coverage. Because of Keelin Neville past medical history and time since last visit, he/she will require a follow-up visit in order to better assess preoperative cardiovascular risk.  Pre-op covering staff: - Please schedule appointment and call patient to inform them. - Please contact requesting surgeon's office via preferred method (i.e, phone, fax) to inform them of need for appointment prior to surgery.  If applicable, this message will also be routed to pharmacy pool and/or primary cardiologist for input on holding anticoagulant/antiplatelet agent as requested below so that this information is available at time of patient's appointment.   Deberah Pelton, NP  10/16/2021, 1:10 PM

## 2021-10-16 NOTE — Telephone Encounter (Signed)
[facility] Pre-operative Risk Assessment     Request for surgical clearance:     Endoscopy Procedure  What type of surgery is being performed?     Colonoscopy with possible Endoscopy  When is this surgery scheduled?     TBD  What type of clearance is required ?   Medical  Practice name and name of physician performing surgery?      [facility]  What is your office phone and fax number?      Phone- [phone]  [phone]  Anesthesia type (None, local, MAC, general) ?       MAC  We will be obtaining a clearance form Pulmonary, but would also obtain a clearance from Cardiology

## 2021-10-18 NOTE — Telephone Encounter (Signed)
I will update the pre op provider as well to disregard clearance request.

## 2021-10-18 NOTE — Telephone Encounter (Signed)
Checked up on request for clearance and noticed that the patient is seen by [facility]. Please retract request for clearance as I will be sending a letter to [facility]. Thanks.

## 2021-10-21 DIAGNOSIS — N19 Unspecified kidney failure: Secondary | ICD-10-CM | POA: Diagnosis not present

## 2021-10-21 DIAGNOSIS — R808 Other proteinuria: Secondary | ICD-10-CM | POA: Diagnosis not present

## 2021-10-21 DIAGNOSIS — I129 Hypertensive chronic kidney disease with stage 1 through stage 4 chronic kidney disease, or unspecified chronic kidney disease: Secondary | ICD-10-CM | POA: Diagnosis not present

## 2021-10-21 DIAGNOSIS — N170 Acute kidney failure with tubular necrosis: Secondary | ICD-10-CM | POA: Diagnosis not present

## 2021-10-23 DIAGNOSIS — I129 Hypertensive chronic kidney disease with stage 1 through stage 4 chronic kidney disease, or unspecified chronic kidney disease: Secondary | ICD-10-CM | POA: Diagnosis not present

## 2021-10-23 DIAGNOSIS — R808 Other proteinuria: Secondary | ICD-10-CM | POA: Diagnosis not present

## 2021-10-23 DIAGNOSIS — D638 Anemia in other chronic diseases classified elsewhere: Secondary | ICD-10-CM | POA: Diagnosis not present

## 2021-10-23 DIAGNOSIS — I5032 Chronic diastolic (congestive) heart failure: Secondary | ICD-10-CM | POA: Diagnosis not present

## 2021-10-23 DIAGNOSIS — N170 Acute kidney failure with tubular necrosis: Secondary | ICD-10-CM | POA: Diagnosis not present

## 2021-10-24 NOTE — Telephone Encounter (Signed)
[facility] to follow up on clearance. I was advised that it did not get received and verified fax number. Clearance has been re-faxed.

## 2021-10-24 NOTE — Telephone Encounter (Signed)
[facility] at [facility], for update on clearance. It has been confirmed that they have received my fax, but awaiting response from provider.

## 2021-10-29 NOTE — Telephone Encounter (Signed)
Clearance has been received form [facility]. Patient has been given an okay to proceed.

## 2021-10-30 ENCOUNTER — Telehealth: Payer: Self-pay

## 2021-10-30 ENCOUNTER — Other Ambulatory Visit: Payer: Self-pay

## 2021-10-30 DIAGNOSIS — D649 Anemia, unspecified: Secondary | ICD-10-CM

## 2021-10-30 DIAGNOSIS — Z86010 Personal history of colonic polyps: Secondary | ICD-10-CM

## 2021-10-30 MED ORDER — PLENVU 140 G PO SOLR: 1.0000 | ORAL | 0 refills | Status: DC

## 2021-10-30 NOTE — Telephone Encounter (Addendum)
Called and spoke with patient to offer her an appt at [facility] for [facility]. Patient is able to come in for this appt. We have scheduled her for EGD and colon at [facility] on Monday, 11/11/21 at 11:15 m. Pt is aware that she will need to arrive at [facility] by 9:45 am with a care partner. Pt is aware that I will send her instructions to her via MyChart and will also mail her a copy. Pt would like prep sent to pharmacy on file. Pt knows to pick this up when she returns to town. Pt verbalized understanding and had no concerns at the end of the call.  Ambulatory referral to GI. Instructions sent to patient via MyChart and mailed. Plenvu prep sent to pharmacy on file.

## 2021-10-30 NOTE — Telephone Encounter (Signed)
Left voicemail to follow up on clearance

## 2021-10-31 ENCOUNTER — Ambulatory Visit: Payer: PRIVATE HEALTH INSURANCE | Admitting: Nurse Practitioner

## 2021-11-01 ENCOUNTER — Encounter (HOSPITAL_COMMUNITY): Payer: Self-pay | Admitting: Gastroenterology

## 2021-11-05 NOTE — Telephone Encounter (Signed)
[facility] in [place] [phone]). I spoke with Thayer Headings, Glass blower/designer. I was informed that clearance request has been received and they will have Dr. Theador Hawthorne sign off on clearance tomorrow when he returns to the office. I asked that clearance letter be faxed to my attention. Will await fax.

## 2021-11-06 DIAGNOSIS — G4733 Obstructive sleep apnea (adult) (pediatric): Secondary | ICD-10-CM | POA: Diagnosis not present

## 2021-11-07 NOTE — Telephone Encounter (Signed)
[facility] in [place] [phone]). I spoke with Thayer Headings, Glass blower/designer. She states that she faxed the clearance request back. I informed her that I had not received it. She will fax to me again at [phone]. Will await fax.

## 2021-11-07 NOTE — Telephone Encounter (Signed)
Fax with nephrology clearance received from Dr. Toya Smothers office. Unfortunately, the notation from Dr. Theador Hawthorne was not legible. I called [facility] and spoke with Thayer Headings to see if she could clarify the notation on the clearance letter. Per Thayer Headings letter states "No contraindication from a renal stand point."

## 2021-11-07 NOTE — Telephone Encounter (Signed)
Got it, okay thank you very much for clarifying.

## 2021-11-11 ENCOUNTER — Ambulatory Visit (HOSPITAL_COMMUNITY)
Admission: RE | Admit: 2021-11-11 | Discharge: 2021-11-11 | Disposition: A | Discharge status: Home / Self Care | Payer: Medicare Other | Attending: Gastroenterology | Admitting: Gastroenterology

## 2021-11-11 ENCOUNTER — Ambulatory Visit (HOSPITAL_BASED_OUTPATIENT_CLINIC_OR_DEPARTMENT_OTHER): Payer: Medicare Other | Admitting: Physician Assistant

## 2021-11-11 ENCOUNTER — Encounter (HOSPITAL_COMMUNITY): Payer: Self-pay | Admitting: Gastroenterology

## 2021-11-11 ENCOUNTER — Other Ambulatory Visit: Payer: Self-pay

## 2021-11-11 ENCOUNTER — Encounter (HOSPITAL_COMMUNITY)
Admission: RE | Disposition: A | Discharge status: Home / Self Care | Payer: Self-pay | Source: Home / Self Care | Attending: Gastroenterology

## 2021-11-11 ENCOUNTER — Ambulatory Visit (HOSPITAL_COMMUNITY): Payer: Medicare Other | Admitting: Physician Assistant

## 2021-11-11 DIAGNOSIS — Z86010 Personal history of colonic polyps: Secondary | ICD-10-CM

## 2021-11-11 DIAGNOSIS — D649 Anemia, unspecified: Secondary | ICD-10-CM

## 2021-11-11 DIAGNOSIS — K648 Other hemorrhoids: Secondary | ICD-10-CM

## 2021-11-11 DIAGNOSIS — G4730 Sleep apnea, unspecified: Secondary | ICD-10-CM | POA: Insufficient documentation

## 2021-11-11 DIAGNOSIS — D125 Benign neoplasm of sigmoid colon: Secondary | ICD-10-CM | POA: Insufficient documentation

## 2021-11-11 DIAGNOSIS — K635 Polyp of colon: Secondary | ICD-10-CM

## 2021-11-11 DIAGNOSIS — K2950 Unspecified chronic gastritis without bleeding: Secondary | ICD-10-CM | POA: Diagnosis not present

## 2021-11-11 DIAGNOSIS — D122 Benign neoplasm of ascending colon: Secondary | ICD-10-CM | POA: Diagnosis not present

## 2021-11-11 DIAGNOSIS — D631 Anemia in chronic kidney disease: Secondary | ICD-10-CM | POA: Diagnosis not present

## 2021-11-11 DIAGNOSIS — I080 Rheumatic disorders of both mitral and aortic valves: Secondary | ICD-10-CM | POA: Diagnosis not present

## 2021-11-11 DIAGNOSIS — K5730 Diverticulosis of large intestine without perforation or abscess without bleeding: Secondary | ICD-10-CM | POA: Insufficient documentation

## 2021-11-11 DIAGNOSIS — K449 Diaphragmatic hernia without obstruction or gangrene: Secondary | ICD-10-CM | POA: Diagnosis not present

## 2021-11-11 DIAGNOSIS — I130 Hypertensive heart and chronic kidney disease with heart failure and stage 1 through stage 4 chronic kidney disease, or unspecified chronic kidney disease: Secondary | ICD-10-CM | POA: Insufficient documentation

## 2021-11-11 DIAGNOSIS — K317 Polyp of stomach and duodenum: Secondary | ICD-10-CM

## 2021-11-11 DIAGNOSIS — K3189 Other diseases of stomach and duodenum: Secondary | ICD-10-CM

## 2021-11-11 DIAGNOSIS — F17210 Nicotine dependence, cigarettes, uncomplicated: Secondary | ICD-10-CM | POA: Diagnosis not present

## 2021-11-11 DIAGNOSIS — N184 Chronic kidney disease, stage 4 (severe): Secondary | ICD-10-CM | POA: Diagnosis not present

## 2021-11-11 DIAGNOSIS — I5032 Chronic diastolic (congestive) heart failure: Secondary | ICD-10-CM | POA: Insufficient documentation

## 2021-11-11 DIAGNOSIS — K219 Gastro-esophageal reflux disease without esophagitis: Secondary | ICD-10-CM | POA: Insufficient documentation

## 2021-11-11 DIAGNOSIS — K6389 Other specified diseases of intestine: Secondary | ICD-10-CM | POA: Insufficient documentation

## 2021-11-11 DIAGNOSIS — Z09 Encounter for follow-up examination after completed treatment for conditions other than malignant neoplasm: Secondary | ICD-10-CM | POA: Diagnosis not present

## 2021-11-11 DIAGNOSIS — D123 Benign neoplasm of transverse colon: Secondary | ICD-10-CM | POA: Insufficient documentation

## 2021-11-11 DIAGNOSIS — D132 Benign neoplasm of duodenum: Secondary | ICD-10-CM

## 2021-11-11 DIAGNOSIS — I4891 Unspecified atrial fibrillation: Secondary | ICD-10-CM | POA: Diagnosis not present

## 2021-11-11 DIAGNOSIS — D509 Iron deficiency anemia, unspecified: Secondary | ICD-10-CM | POA: Insufficient documentation

## 2021-11-11 DIAGNOSIS — D126 Benign neoplasm of colon, unspecified: Secondary | ICD-10-CM | POA: Diagnosis not present

## 2021-11-11 DIAGNOSIS — I272 Pulmonary hypertension, unspecified: Secondary | ICD-10-CM | POA: Diagnosis not present

## 2021-11-11 DIAGNOSIS — G4733 Obstructive sleep apnea (adult) (pediatric): Secondary | ICD-10-CM | POA: Diagnosis not present

## 2021-11-11 DIAGNOSIS — K2960 Other gastritis without bleeding: Secondary | ICD-10-CM | POA: Diagnosis not present

## 2021-11-11 HISTORY — PX: COLONOSCOPY WITH PROPOFOL: SHX5780

## 2021-11-11 HISTORY — PX: BIOPSY: SHX5522

## 2021-11-11 HISTORY — PX: POLYPECTOMY: SHX5525

## 2021-11-11 HISTORY — PX: ESOPHAGOGASTRODUODENOSCOPY (EGD) WITH PROPOFOL: SHX5813

## 2021-11-11 LAB — POCT I-STAT, CHEM 8
BUN: 36 mg/dL — ABNORMAL HIGH (ref 8–23)
Calcium, Ion: 1.27 mmol/L (ref 1.15–1.40)
Chloride: 115 mmol/L — ABNORMAL HIGH (ref 98–111)
Creatinine, Ser: 3.3 mg/dL — ABNORMAL HIGH (ref 0.44–1.00)
Glucose, Bld: 106 mg/dL — ABNORMAL HIGH (ref 70–99)
HCT: 31 % — ABNORMAL LOW (ref 36.0–46.0)
Hemoglobin: 10.5 g/dL — ABNORMAL LOW (ref 12.0–15.0)
Potassium: 4.6 mmol/L (ref 3.5–5.1)
Sodium: 147 mmol/L — ABNORMAL HIGH (ref 135–145)
TCO2: 21 mmol/L — ABNORMAL LOW (ref 22–32)

## 2021-11-11 SURGERY — ESOPHAGOGASTRODUODENOSCOPY (EGD) WITH PROPOFOL
Anesthesia: Monitor Anesthesia Care

## 2021-11-11 MED ORDER — PROPOFOL 1000 MG/100ML IV EMUL
INTRAVENOUS | Status: AC
  Filled 2021-11-11: qty 100

## 2021-11-11 MED ORDER — PROPOFOL 10 MG/ML IV BOLUS
INTRAVENOUS | Status: AC
  Filled 2021-11-11: qty 20

## 2021-11-11 MED ORDER — PROPOFOL 500 MG/50ML IV EMUL
INTRAVENOUS | Status: DC | PRN
  Administered 2021-11-11: 120 ug/kg/min via INTRAVENOUS

## 2021-11-11 MED ORDER — SODIUM CHLORIDE 0.9 % IV SOLN: INTRAVENOUS | Status: DC

## 2021-11-11 MED ORDER — PROPOFOL 10 MG/ML IV BOLUS
INTRAVENOUS | Status: DC | PRN
  Administered 2021-11-11: 10 mg via INTRAVENOUS
  Administered 2021-11-11 (×3): 20 mg via INTRAVENOUS
  Administered 2021-11-11 (×3): 10 mg via INTRAVENOUS
  Administered 2021-11-11 (×5): 20 mg via INTRAVENOUS

## 2021-11-11 SURGICAL SUPPLY — 25 items

## 2021-11-11 NOTE — Op Note (Signed)
[facility] Patient Name: Lori Ortiz Procedure Date: 11/11/2021 MRN: [id number] Attending MD: Carlota Raspberry. Havery Moros , MD Date of Birth: [birth year] CSN: [id number] Age: 66 Admit Type: Outpatient Procedure:                Colonoscopy Indications:              history of Iron deficiency, chronic anemia, history                            of reported polyps removed 2012 Providers:                Carlota Raspberry. Havery Moros, MD, Benay Pillow, RN, Allayne Gitelman, RN, [facility] Technician, Technician,                            Brien Mates, RNFA, Gloris Ham,                            Technician Referring MD:              Medicines:                Monitored Anesthesia Care Complications:            No immediate complications. Estimated blood loss:                            Minimal. Estimated Blood Loss:     Estimated blood loss was minimal. Procedure:                Pre-Anesthesia Assessment:                           - Prior to the procedure, a History and Physical                            was performed, and patient medications and                            allergies were reviewed. The patient's tolerance of                            previous anesthesia was also reviewed. The risks                            and benefits of the procedure and the sedation                            options and risks were discussed with the patient.                            All questions were answered, and informed consent                            was obtained. Prior Anticoagulants: The  patient has                            taken no previous anticoagulant or antiplatelet                            agents. ASA Grade Assessment: III - A patient with                            severe systemic disease. After reviewing the risks                            and benefits, the patient was deemed in                            satisfactory condition to undergo the  procedure.                           After obtaining informed consent, the colonoscope                            was passed under direct vision. Throughout the                            procedure, the patient's blood pressure, pulse, and                            oxygen saturations were monitored continuously. The                            CF-HQ190L ([id number] Olympus colonoscope was                            introduced through the anus and advanced to the the                            terminal ileum, with identification of the                            appendiceal orifice and IC valve. The colonoscopy                            was performed without difficulty. The patient                            tolerated the procedure well. The quality of the                            bowel preparation was good. The terminal ileum,                            ileocecal valve, appendiceal orifice, and rectum  were photographed. Scope In: 11:53:15 AM Scope Out: 12:20:30 PM Scope Withdrawal Time: 0 hours 22 minutes 20 seconds  Total Procedure Duration: 0 hours 27 minutes 15 seconds  Findings:      The perianal and digital rectal examinations were normal other than       perianal skin tags.      The terminal ileum appeared normal.      Three sessile polyps were found in the ascending colon. The polyps were       3 to 4 mm in size. These polyps were removed with a cold snare.       Resection and retrieval were complete.      A 2 mm polyp was found in the hepatic flexure. The polyp was sessile.       The polyp was removed with a cold snare. Resection and retrieval were       complete.      Two sessile polyps were found in the sigmoid colon. The polyps were 3 to       8 mm in size. These polyps were removed with a cold snare. Resection and       retrieval were complete.      A few small-mouthed diverticula were found in the sigmoid colon and       ascending colon.       Internal hemorrhoids were found during retroflexion.      The colon was tortous. The exam was otherwise without abnormality. Impression:               - The examined portion of the ileum was normal.                           - Three 3 to 4 mm polyps in the ascending colon,                            removed with a cold snare. Resected and retrieved.                           - One 2 mm polyp at the hepatic flexure, removed                            with a cold snare. Resected and retrieved.                           - Two 3 to 8 mm polyps in the sigmoid colon,                            removed with a cold snare. Resected and retrieved.                           - Diverticulosis in the sigmoid colon and in the                            ascending colon.                           - Internal hemorrhoids.                           -  Tortous colon                           - The examination was otherwise normal.                           No cause for iron deficiency on colonoscopy. Moderate Sedation:      No moderate sedation, case performed with MAC Recommendation:           - Patient has a contact number available for                            emergencies. The signs and symptoms of potential                            delayed complications were discussed with the                            patient. Return to normal activities tomorrow.                            Written discharge instructions were provided to the                            patient.                           - Resume previous diet.                           - Continue present medications.                           - Await pathology results. Procedure Code(s):        --- Professional ---                           [phone], Colonoscopy, flexible; with removal of                            tumor(s), polyp(s), or other lesion(s) by snare                            technique Diagnosis Code(s):        --- Professional ---                            K64.8, Other hemorrhoids                           K63.5, Polyp of colon                           D50.9, Iron deficiency anemia, unspecified                           K57.30, Diverticulosis of large intestine without  perforation or abscess without bleeding CPT copyright 2019 American Medical Association. All rights reserved. The codes documented in this report are preliminary and upon coder review may  be revised to meet current compliance requirements. Remo Lipps P. Kemar Pandit, MD 11/11/2021 12:27:46 PM This report has been signed electronically. Number of Addenda: 0

## 2021-11-11 NOTE — Discharge Instructions (Signed)
YOU HAD AN ENDOSCOPIC PROCEDURE TODAY: Refer to the procedure report and other information in the discharge instructions given to you for any specific questions about what was found during the examination. If this information does not answer your questions, please call [place] office at [phone] to clarify.  ° °YOU SHOULD EXPECT: Some feelings of bloating in the abdomen. Passage of more gas than usual. Walking can help get rid of the air that was put into your GI tract during the procedure and reduce the bloating. If you had a lower endoscopy (such as a colonoscopy or flexible sigmoidoscopy) you may notice spotting of blood in your stool or on the toilet paper. Some abdominal soreness may be present for a day or two, also. ° °DIET: Your first meal following the procedure should be a light meal and then it is ok to progress to your normal diet. A half-sandwich or bowl of soup is an example of a good first meal. Heavy or fried foods are harder to digest and may make you feel nauseous or bloated. Drink plenty of fluids but you should avoid alcoholic beverages for 24 hours. If you had a esophageal dilation, please see attached instructions for diet.   ° °ACTIVITY: Your care partner should take you home directly after the procedure. You should plan to take it easy, moving slowly for the rest of the day. You can resume normal activity the day after the procedure however YOU SHOULD NOT DRIVE, use power tools, machinery or perform tasks that involve climbing or major physical exertion for 24 hours (because of the sedation medicines used during the test).  ° °SYMPTOMS TO REPORT IMMEDIATELY: °A gastroenterologist can be reached at any hour. Please call [phone]  for any of the following symptoms:  °Following lower endoscopy (colonoscopy, flexible sigmoidoscopy) °Excessive amounts of blood in the stool  °Significant tenderness, worsening of abdominal pains  °Swelling of the abdomen that is new, acute  °Fever of 100° or  higher  °Following upper endoscopy (EGD, EUS, ERCP, esophageal dilation) °Vomiting of blood or coffee ground material  °New, significant abdominal pain  °New, significant chest pain or pain under the shoulder blades  °Painful or persistently difficult swallowing  °New shortness of breath  °Black, tarry-looking or red, bloody stools ° °FOLLOW UP:  °If any biopsies were taken you will be contacted by phone or by letter within the next 1-3 weeks. Call [phone]  if you have not heard about the biopsies in 3 weeks.  °Please also call with any specific questions about appointments or follow up tests. ° °

## 2021-11-11 NOTE — Anesthesia Postprocedure Evaluation (Signed)
Anesthesia Post Note  Patient: Lori Ortiz  Procedure(s) Performed: ESOPHAGOGASTRODUODENOSCOPY (EGD) WITH PROPOFOL COLONOSCOPY WITH PROPOFOL BIOPSY POLYPECTOMY     Patient location during evaluation: Endoscopy Anesthesia Type: MAC Level of consciousness: awake and alert Pain management: pain level controlled Vital Signs Assessment: post-procedure vital signs reviewed and stable Respiratory status: spontaneous breathing, nonlabored ventilation and respiratory function stable Cardiovascular status: blood pressure returned to baseline and stable Postop Assessment: no apparent nausea or vomiting Anesthetic complications: no   No notable events documented.  Last Vitals:  Vitals:   11/11/21 1240 11/11/21 1250  BP: 134/65 (!) 177/61  Pulse: (!) 58 60  Resp: (!) 21 20  Temp:    SpO2: 99% 99%    Last Pain:  Vitals:   11/11/21 1250  TempSrc:   PainSc: 0-No pain                 Lidia Collum

## 2021-11-11 NOTE — H&P (Signed)
[facility] History and Physical   Primary Care Physician:  Chevis Pretty, FNP   Reason for Procedure:   Iron deficiency anemia, colon cancer screening  Plan:    EGD and colonoscopy     HPI: Lori Ortiz is a 66 y.o. female  here for EGD and colonoscopy to evaluate issues as above. Patient denies any bowel symptoms at this time. No family history of colon cancer known. Otherwise feels well without any cardiopulmonary symptoms. She has CKD, multiple medical problems as outlined in her office visit from within the past month. No interval changes. Cardiac and nephrology clearance obtained. She tolerated the prep well, labs look okay this AM. I have discussed risks / benefits of anesthesia and EGD / colonoscopy with her, she wants to proceed. Last colonoscopy 2012, no prior EGD. Hgb stable in the 10s.   I have discussed risks / benefits of anesthesia and endoscopic procedure with Lori Ortiz and they wish to proceed with the exams as outlined today.    Past Medical History:  Diagnosis Date   Cardiac arrhythmia due to congenital heart disease    GERD (gastroesophageal reflux disease)    Hypertension    Sleep apnea    Stroke ([place])   CKD stage IV AS Atrial fibrillation Diastolic CHF Pulm HTN Nephrotic syndrome MGUS  History reviewed. No pertinent surgical history.  Prior to Admission medications   Medication Sig Start Date End Date Taking? Authorizing Provider  amLODipine (NORVASC) 5 MG tablet Take 1 tablet (5 mg total) by mouth daily. 07/23/21 07/23/22 Yes Martin, Mary-Margaret, FNP  aspirin EC 81 MG tablet Take 81 mg by mouth daily. Swallow whole.   Yes [provider]  atorvastatin (LIPITOR) 40 MG tablet Take 1 tablet (40 mg total) by mouth daily. 07/23/21  Yes Martin, Mary-Margaret, FNP  Cholecalciferol (VITAMIN D) 50 MCG (2000 UT) tablet Take 2,000 Units by mouth daily.   Yes [provider]  ferrous sulfate 325 (65 FE) MG EC tablet Take 1  tablet (325 mg total) by mouth daily. 07/23/21  Yes Martin, Mary-Margaret, FNP  hydrALAZINE (APRESOLINE) 25 MG tablet Take 1 tablet (25 mg total) by mouth 3 (three) times daily. Patient taking differently: Take 100 mg by mouth 3 (three) times daily. 01/22/21  Yes Martin, Mary-Margaret, FNP  isosorbide dinitrate (ISORDIL) 10 MG tablet Take 2 tablets (20 mg total) by mouth 3 (three) times daily. 07/23/21 07/23/22 Yes Martin, Mary-Margaret, FNP  pantoprazole (PROTONIX) 40 MG tablet Take 1 tablet (40 mg total) by mouth daily. (NEEDS TO BE SEEN BEFORE NEXT REFILL) 07/23/21  Yes Hassell Done, Mary-Margaret, FNP  PEG-KCl-NaCl-NaSulf-Na Asc-C (PLENVU) 140 g SOLR Take 1 kit by mouth as directed. Use coupon: BIN: [id number] PNC: CNRX Group: [id number] ID: [id number] 10/30/21  Yes Ulyana Pitones, Carlota Raspberry, MD  torsemide (DEMADEX) 20 MG tablet Take 2 tablets (40 mg total) by mouth daily. 07/23/21  Yes Martin, Mary-Margaret, FNP  losartan (COZAAR) 100 MG tablet Take 1 tablet (100 mg total) by mouth daily. Patient not taking: Reported on 11/05/2021 07/23/21   Chevis Pretty, FNP    Current Facility-Administered Medications  Medication Dose Route Frequency Provider Last Rate Last Admin   0.9 %  sodium chloride infusion   Intravenous Continuous Andrey Mccaskill, Carlota Raspberry, MD 20 mL/hr at 11/11/21 1026 New Bag at 11/11/21 1026    Allergies as of 10/30/2021   (No Known Allergies)    Family History  Problem Relation Age of Onset   Heart disease Mother    Diabetes Mother  Aneurysm Father    Hypertension Father    Cancer Sister    Hypertension Brother    Stomach cancer Neg Hx    Esophageal cancer Neg Hx    Colon cancer Neg Hx     Social History   Socioeconomic History   Marital status: Single    Spouse name: Not on file   Number of children: 0   Years of education: Not on file   Highest education level: Not on file  Occupational History   Occupation: retired  Tobacco Use   Smoking status: Every Day    Packs/day:  0.50    Types: Cigarettes   Smokeless tobacco: Never  Vaping Use   Vaping Use: Never used  Substance and Sexual Activity   Alcohol use: No   Drug use: No   Sexual activity: Not on file  Other Topics Concern   Not on file  Social History Narrative   Not on file   Social Determinants of Health   Financial Resource Strain: Not on file  Food Insecurity: Not on file  Transportation Needs: Not on file  Physical Activity: Not on file  Stress: Not on file  Social Connections: Not on file  Intimate Partner Violence: Not on file    Review of Systems: All other review of systems negative except as mentioned in the HPI.  Physical Exam: Vital signs BP (!) 167/50   Pulse (!) 51   Temp (!) 97.3 F (36.3 C) (Temporal)   Resp (!) 24   Ht '5\' 9"'  (1.753 m)   Wt 95.3 kg   SpO2 99%   BMI 31.01 kg/m   General:   Alert,  Well-developed, pleasant and cooperative in NAD Lungs:  Clear throughout to auscultation.   Heart:  Regular rate and rhythm. 2/6 SEM Abdomen:  Soft, nontender and nondistended.   Neuro/Psych:  Alert and cooperative. Normal mood and affect. A and O x 3  Jolly Mango, MD [facility]

## 2021-11-11 NOTE — Transfer of Care (Signed)
Immediate Anesthesia Transfer of Care Note  Patient: Lori Ortiz  Procedure(s) Performed: ESOPHAGOGASTRODUODENOSCOPY (EGD) WITH PROPOFOL COLONOSCOPY WITH PROPOFOL BIOPSY POLYPECTOMY  Patient Location: PACU  Anesthesia Type:MAC  Level of Consciousness: sedated  Airway & Oxygen Therapy: Patient Spontanous Breathing and Patient connected to face mask oxygen  Post-op Assessment: Report given to RN and Post -op Vital signs reviewed and stable  Post vital signs: Reviewed and stable  Last Vitals:  Vitals Value Taken Time  BP    Temp    Pulse 52 11/11/21 1225  Resp 13 11/11/21 1225  SpO2 100 % 11/11/21 1225  Vitals shown include unvalidated device data.  Last Pain:  Vitals:   11/11/21 1022  TempSrc: Temporal  PainSc: 0-No pain         Complications: No notable events documented.

## 2021-11-11 NOTE — Anesthesia Preprocedure Evaluation (Signed)
Anesthesia Evaluation  Patient identified by MRN, date of birth, ID band Patient awake    Reviewed: Allergy & Precautions, NPO status , Patient's Chart, lab work & pertinent test results  History of Anesthesia Complications Negative for: history of anesthetic complications  Airway Mallampati: II  TM Distance: >3 FB Neck ROM: Full    Dental   Pulmonary sleep apnea , Current Smoker,    Pulmonary exam normal        Cardiovascular hypertension, Normal cardiovascular exam+ dysrhythmias Atrial Fibrillation + Valvular Problems/Murmurs AS and MR   Echo 06/26/21 [facility]): Summary  1. The left ventricle is normal in size with mildly increased wall  thickness.  2. The left ventricular systolic function is normal, LVEF is visually  estimated at > 55%.  3. There is mild mitral valve regurgitation.  4. There is mild aortic valve stenosis.  5. The left atrium is moderately dilated in size.  6. The right ventricle is normal in size, with normal systolic function.  7. There is mild to moderate tricuspid regurgitation.  8. There is moderate pulmonary hypertension.  9. The right atrium is mildly to moderately dilated in size.  10. IVC size and inspiratory change suggest elevated right atrial pressure.  (10-20 mmHg).     Neuro/Psych CVA    GI/Hepatic Neg liver ROS, GERD  ,  Endo/Other  negative endocrine ROS  Renal/GU Renal disease  negative genitourinary   Musculoskeletal negative musculoskeletal ROS (+)   Abdominal   Peds  Hematology  (+) Blood dyscrasia, anemia ,   Anesthesia Other Findings   Reproductive/Obstetrics                             Anesthesia Physical Anesthesia Plan  ASA: 3  Anesthesia Plan: MAC   Post-op Pain Management: Minimal or no pain anticipated   Induction: Intravenous  PONV Risk Score and Plan: 2 and Propofol infusion, TIVA and Treatment may vary due to  age or medical condition  Airway Management Planned: Natural Airway, Nasal Cannula and Simple Face Mask  Additional Equipment: None  Intra-op Plan:   Post-operative Plan:   Informed Consent: I have reviewed the patients History and Physical, chart, labs and discussed the procedure including the risks, benefits and alternatives for the proposed anesthesia with the patient or authorized representative who has indicated his/her understanding and acceptance.       Plan Discussed with:   Anesthesia Plan Comments:         Anesthesia Quick Evaluation

## 2021-11-11 NOTE — Op Note (Signed)
[facility] Patient Name: Lori Ortiz Procedure Date: 11/11/2021 MRN: [id number] Attending MD: Carlota Raspberry. Havery Moros , MD Date of Birth: August 27, 1955 CSN: [id number] Age: 66 Admit Type: Outpatient Procedure:                Upper GI endoscopy Indications:              Iron deficiency, chronic anemia - CKD - history of                            GERD, clear upper tract in regards to iron                            deficiency component Providers:                Carlota Raspberry. Havery Moros, MD, Benay Pillow, RN, Allayne Gitelman, RN, [facility] [facility] Technician, Technician,                            Gloris Ham, Technician, Brien Mates,                            RNFA Referring MD:              Medicines:                Monitored Anesthesia Care Complications:            No immediate complications. Estimated blood loss:                            Minimal. Estimated Blood Loss:     Estimated blood loss was minimal. Procedure:                Pre-Anesthesia Assessment:                           - Prior to the procedure, a History and Physical                            was performed, and patient medications and                            allergies were reviewed. The patient's tolerance of                            previous anesthesia was also reviewed. The risks                            and benefits of the procedure and the sedation                            options and risks were discussed with the patient.                            All questions were  answered, and informed consent                            was obtained. Prior Anticoagulants: The patient has                            taken no previous anticoagulant or antiplatelet                            agents. ASA Grade Assessment: III - A patient with                            severe systemic disease. After reviewing the risks                            and benefits, the patient was deemed in                             satisfactory condition to undergo the procedure.                           After obtaining informed consent, the endoscope was                            passed under direct vision. Throughout the                            procedure, the patient's blood pressure, pulse, and                            oxygen saturations were monitored continuously. The                            [id number]-H190 ([id number]) Olympus endoscope was introduced                            through the mouth, and advanced to the third part                            of duodenum. The upper GI endoscopy was                            accomplished without difficulty. The patient                            tolerated the procedure well. Scope In: Scope Out: Findings:      Esophagogastric landmarks were identified: the Z-line was found at 39       cm, the gastroesophageal junction was found at 39 cm and the upper       extent of the gastric folds was found at 42 cm from the incisors.      A 3 cm hiatal hernia was present.      The exam of the esophagus was otherwise normal.      Multiple small sessile polyps were  found in the gastric fundus and in       the gastric body. Suspect benign fundic gland polyps. One representative       polyp was removed with a cold biopsy forceps. Resection and retrieval       were complete.      Patchy mildly erythematous mucosa was found in the gastric body and in       the gastric antrum.      The exam of the stomach was otherwise normal.      Biopsies were taken with a cold forceps in the gastric body, at the       incisura and in the gastric antrum for Helicobacter pylori testing.      A single diminutive sessile polyp was found in the second portion of the       duodenum. The polyp was removed with a cold biopsy forceps. Resection       and retrieval were complete.      Changes concerning for a possible polyp were found in the second portion       of the duodenum.  Hard to clearly see ampulla, this could have been in       close approximation. Biopsies were taken with a cold forceps for       histology to rule out adenoma.      The exam of the duodenum was otherwise normal. Impression:               - Esophagogastric landmarks identified.                           - 3 cm hiatal hernia.                           - Normal esophagus otherwise                           - Multiple gastric polyps. Suspect benign fundic                            gland polyps, on representative sample resected and                            retrieved.                           - Erythematous mucosa in the gastric body and                            antrum.                           - Normal stomach otherwise - biopsies taken to rule                            out H pylori                           - A single duodenal polyp. Resected and retrieved.                           -  Mucosal changes in the duodenum suspicious for                            polypoid changes but could not clearly see ampulla.                            Biopsied.                           - Normal duodenum otherwise.                           No overt cause for iron deficiency on this exam.                            Will await biopsies. Suspect multifactorial anemia                            with CKD. Iron studies have recently normalized. Moderate Sedation:      No moderate sedation, case performed with MAC Recommendation:           - Patient has a contact number available for                            emergencies. The signs and symptoms of potential                            delayed complications were discussed with the                            patient. Return to normal activities tomorrow.                            Written discharge instructions were provided to the                            patient.                           - Resume previous diet.                           - Continue  present medications.                           - Await pathology results with further                            recommendations Procedure Code(s):        --- Professional ---                           [phone], Esophagogastroduodenoscopy, flexible,                            transoral; with biopsy, single or multiple Diagnosis Code(s):        ---  Professional ---                           K44.9, Diaphragmatic hernia without obstruction or                            gangrene                           K31.7, Polyp of stomach and duodenum                           K31.89, Other diseases of stomach and duodenum                           D50.9, Iron deficiency anemia, unspecified CPT copyright 2019 American Medical Association. All rights reserved. The codes documented in this report are preliminary and upon coder review may  be revised to meet current compliance requirements. Remo Lipps P. Keilah Lemire, MD 11/11/2021 12:35:54 PM This report has been signed electronically. Number of Addenda: 0

## 2021-11-12 ENCOUNTER — Encounter (HOSPITAL_COMMUNITY): Payer: Self-pay | Admitting: Gastroenterology

## 2021-11-12 LAB — SURGICAL PATHOLOGY

## 2021-12-02 DIAGNOSIS — D638 Anemia in other chronic diseases classified elsewhere: Secondary | ICD-10-CM | POA: Diagnosis not present

## 2021-12-02 DIAGNOSIS — N170 Acute kidney failure with tubular necrosis: Secondary | ICD-10-CM | POA: Diagnosis not present

## 2021-12-02 DIAGNOSIS — R808 Other proteinuria: Secondary | ICD-10-CM | POA: Diagnosis not present

## 2021-12-02 DIAGNOSIS — I5032 Chronic diastolic (congestive) heart failure: Secondary | ICD-10-CM | POA: Diagnosis not present

## 2021-12-02 DIAGNOSIS — I129 Hypertensive chronic kidney disease with stage 1 through stage 4 chronic kidney disease, or unspecified chronic kidney disease: Secondary | ICD-10-CM | POA: Diagnosis not present

## 2021-12-06 DIAGNOSIS — G4733 Obstructive sleep apnea (adult) (pediatric): Secondary | ICD-10-CM | POA: Diagnosis not present

## 2021-12-11 DIAGNOSIS — I5032 Chronic diastolic (congestive) heart failure: Secondary | ICD-10-CM | POA: Diagnosis not present

## 2021-12-11 DIAGNOSIS — R808 Other proteinuria: Secondary | ICD-10-CM | POA: Diagnosis not present

## 2021-12-11 DIAGNOSIS — R601 Generalized edema: Secondary | ICD-10-CM | POA: Diagnosis not present

## 2021-12-11 DIAGNOSIS — N170 Acute kidney failure with tubular necrosis: Secondary | ICD-10-CM | POA: Diagnosis not present

## 2021-12-11 DIAGNOSIS — N19 Unspecified kidney failure: Secondary | ICD-10-CM | POA: Diagnosis not present

## 2021-12-11 DIAGNOSIS — D638 Anemia in other chronic diseases classified elsewhere: Secondary | ICD-10-CM | POA: Diagnosis not present

## 2021-12-11 DIAGNOSIS — I129 Hypertensive chronic kidney disease with stage 1 through stage 4 chronic kidney disease, or unspecified chronic kidney disease: Secondary | ICD-10-CM | POA: Diagnosis not present

## 2021-12-24 ENCOUNTER — Telehealth: Payer: Self-pay | Admitting: Nurse Practitioner

## 2021-12-24 NOTE — Telephone Encounter (Signed)
Please review and advise and if ok with you please forward to [employer] for review

## 2021-12-24 NOTE — Telephone Encounter (Signed)
Please review

## 2021-12-24 NOTE — Telephone Encounter (Signed)
Fine with me

## 2021-12-25 ENCOUNTER — Other Ambulatory Visit (HOSPITAL_COMMUNITY): Payer: Self-pay

## 2021-12-31 ENCOUNTER — Other Ambulatory Visit: Payer: Self-pay

## 2021-12-31 ENCOUNTER — Encounter (HOSPITAL_COMMUNITY)
Admission: RE | Admit: 2021-12-31 | Discharge: 2021-12-31 | Disposition: A | Discharge status: Home / Self Care | Payer: Medicare Other | Source: Ambulatory Visit | Attending: Nephrology | Admitting: Nephrology

## 2021-12-31 VITALS — BP 130/58 | HR 56 | Temp 97.4°F | Resp 22

## 2021-12-31 DIAGNOSIS — D631 Anemia in chronic kidney disease: Secondary | ICD-10-CM

## 2021-12-31 DIAGNOSIS — N184 Chronic kidney disease, stage 4 (severe): Secondary | ICD-10-CM | POA: Diagnosis not present

## 2021-12-31 DIAGNOSIS — E785 Hyperlipidemia, unspecified: Secondary | ICD-10-CM | POA: Diagnosis not present

## 2021-12-31 DIAGNOSIS — I10 Essential (primary) hypertension: Secondary | ICD-10-CM | POA: Diagnosis not present

## 2021-12-31 DIAGNOSIS — I4821 Permanent atrial fibrillation: Secondary | ICD-10-CM | POA: Diagnosis not present

## 2021-12-31 DIAGNOSIS — I5032 Chronic diastolic (congestive) heart failure: Secondary | ICD-10-CM | POA: Diagnosis not present

## 2021-12-31 DIAGNOSIS — I350 Nonrheumatic aortic (valve) stenosis: Secondary | ICD-10-CM | POA: Diagnosis not present

## 2021-12-31 LAB — POCT HEMOGLOBIN-HEMACUE: Hemoglobin: 9.5 g/dL — ABNORMAL LOW (ref 12.0–15.0)

## 2021-12-31 MED ORDER — EPOETIN ALFA-EPBX 3000 UNIT/ML IJ SOLN: 3000.0000 [IU] | Freq: Once | INTRAMUSCULAR | Status: AC

## 2021-12-31 MED ORDER — EPOETIN ALFA-EPBX 3000 UNIT/ML IJ SOLN
INTRAMUSCULAR | Status: AC
  Administered 2021-12-31: 3000 [IU]
  Filled 2021-12-31: qty 1

## 2021-12-31 NOTE — Progress Notes (Signed)
Diagnosis: Anemia in Chronic Kidney Disease  Provider:  Manpreet Bhutani MD  Procedure: Injection  Retacrit (epoetin alfa-epbx), Dose: 3000 Units, Site: subcutaneous, Number of injections: 1  Post Care: Observation period completed; 15 minutes  Discharge: Condition: Good, Destination: Home . AVS provided to patient.   Performed by:  Binnie Kand, RN

## 2022-01-01 ENCOUNTER — Other Ambulatory Visit: Payer: Self-pay | Admitting: Nurse Practitioner

## 2022-01-01 DIAGNOSIS — K219 Gastro-esophageal reflux disease without esophagitis: Secondary | ICD-10-CM

## 2022-01-01 DIAGNOSIS — E782 Mixed hyperlipidemia: Secondary | ICD-10-CM

## 2022-01-02 NOTE — Telephone Encounter (Signed)
Left message to call back.  When patient calls back schedule her a new patient appointment to establish care with Tiffany as her new provider.

## 2022-01-02 NOTE — Telephone Encounter (Signed)
That is ok 

## 2022-01-06 DIAGNOSIS — G4733 Obstructive sleep apnea (adult) (pediatric): Secondary | ICD-10-CM | POA: Diagnosis not present

## 2022-01-06 NOTE — Telephone Encounter (Signed)
Lori Ortiz has scheduled patient an appointment on 01/22/22

## 2022-01-14 ENCOUNTER — Encounter (HOSPITAL_COMMUNITY)
Admission: RE | Admit: 2022-01-14 | Discharge: 2022-01-14 | Disposition: A | Discharge status: Home / Self Care | Payer: Medicare Other | Source: Ambulatory Visit | Attending: Nephrology | Admitting: Nephrology

## 2022-01-14 VITALS — BP 156/52 | HR 56 | Temp 97.8°F | Resp 20

## 2022-01-14 DIAGNOSIS — N184 Chronic kidney disease, stage 4 (severe): Secondary | ICD-10-CM | POA: Insufficient documentation

## 2022-01-14 DIAGNOSIS — D631 Anemia in chronic kidney disease: Secondary | ICD-10-CM | POA: Diagnosis not present

## 2022-01-14 MED ORDER — EPOETIN ALFA-EPBX 3000 UNIT/ML IJ SOLN
3000.0000 [IU] | Freq: Once | INTRAMUSCULAR | Status: AC
  Administered 2022-01-14: 3000 [IU] via SUBCUTANEOUS
  Filled 2022-01-14: qty 1

## 2022-01-14 NOTE — Progress Notes (Signed)
Diagnosis: Anemia in Chronic Kidney Disease  Provider:  Manpreet Bhutani MD  Procedure: Injection  Retacrit (epoetin alfa-epbx), Dose: 3000 Units, Site: subcutaneous, Number of injections: 1  HGB 9.9  Post Care: Patient declined observation  Discharge: Condition: Good, Destination: Home . AVS provided to patient.   Performed by:  Kaileigh Viswanathan E, RN        

## 2022-01-22 ENCOUNTER — Ambulatory Visit (INDEPENDENT_AMBULATORY_CARE_PROVIDER_SITE_OTHER): Payer: Medicare Other | Admitting: Family Medicine

## 2022-01-22 ENCOUNTER — Encounter: Payer: Self-pay | Admitting: Family Medicine

## 2022-01-22 ENCOUNTER — Encounter (HOSPITAL_COMMUNITY): Payer: Self-pay | Admitting: Nephrology

## 2022-01-22 VITALS — BP 153/57 | HR 53 | Temp 97.7°F | Ht 69.0 in | Wt 225.2 lb

## 2022-01-22 DIAGNOSIS — D631 Anemia in chronic kidney disease: Secondary | ICD-10-CM | POA: Diagnosis not present

## 2022-01-22 DIAGNOSIS — I10 Essential (primary) hypertension: Secondary | ICD-10-CM | POA: Diagnosis not present

## 2022-01-22 DIAGNOSIS — Z23 Encounter for immunization: Secondary | ICD-10-CM | POA: Diagnosis not present

## 2022-01-22 DIAGNOSIS — Z8673 Personal history of transient ischemic attack (TIA), and cerebral infarction without residual deficits: Secondary | ICD-10-CM

## 2022-01-22 DIAGNOSIS — I509 Heart failure, unspecified: Secondary | ICD-10-CM | POA: Diagnosis not present

## 2022-01-22 DIAGNOSIS — K219 Gastro-esophageal reflux disease without esophagitis: Secondary | ICD-10-CM | POA: Diagnosis not present

## 2022-01-22 DIAGNOSIS — G4733 Obstructive sleep apnea (adult) (pediatric): Secondary | ICD-10-CM

## 2022-01-22 DIAGNOSIS — N184 Chronic kidney disease, stage 4 (severe): Secondary | ICD-10-CM

## 2022-01-22 DIAGNOSIS — I4821 Permanent atrial fibrillation: Secondary | ICD-10-CM

## 2022-01-22 DIAGNOSIS — E6609 Other obesity due to excess calories: Secondary | ICD-10-CM

## 2022-01-22 DIAGNOSIS — Z6833 Body mass index (BMI) 33.0-33.9, adult: Secondary | ICD-10-CM

## 2022-01-22 DIAGNOSIS — E66811 Obesity, class 1: Secondary | ICD-10-CM

## 2022-01-22 DIAGNOSIS — E782 Mixed hyperlipidemia: Secondary | ICD-10-CM

## 2022-01-22 NOTE — Progress Notes (Signed)
Established Patient Office Visit  Subjective   Patient ID: Lori Ortiz, female    DOB: 1955/08/24  Age: 66 y.o. MRN: [id number]  Chief Complaint  Patient presents with   Hypertension    Hypertension   HTN Complaint with meds - Yes Current Medications - amlodipine 5 mg, hydralazine 100 mg TID, isordil 20 mg TID, torsemide 40 mg daily Checking BP at home ranging: not in the last few weeks, but reports that it has been well controlled at home  Pertinent ROS:  Fatigue - No Visual Disturbances - No Chest pain - No Dyspnea - with exertion, at baseline Palpitations - No LE edema - No  Managed by nephrology. Torsemide was increased at last visit.  Recently saw cardiology. She was not in A. Fib at her appt with cardiology. Has watchman device. She has a heart murmur as well.  She is on lipitor 40 mg daily and aspirin daily.   2. GERD Reports well controlled with protonix.  3. OSA On cpap. Managed by [facility].  Past Medical History:  Diagnosis Date   Cardiac arrhythmia due to congenital heart disease    GERD (gastroesophageal reflux disease)    Hypertension    Sleep apnea    Stroke ([place])       ROS As per HPI.    Objective:     BP (!) 153/57   Pulse (!) 53   Temp 97.7 F (36.5 C) (Temporal)   Ht '5\' 9"'$  (1.753 m)   Wt 225 lb 4 oz (102.2 kg)   SpO2 96%   BMI 33.26 kg/m  BP Readings from Last 3 Encounters:  01/22/22 (!) 153/57  01/14/22 (!) 156/52  12/31/21 (!) 130/58      Physical Exam Vitals and nursing note reviewed.  Constitutional:      General: She is not in acute distress.    Appearance: She is not ill-appearing, toxic-appearing or diaphoretic.  HENT:     Head: Normocephalic and atraumatic.     Nose: Nose normal.     Mouth/Throat:     Mouth: Mucous membranes are moist.     Pharynx: Oropharynx is clear.  Eyes:     General: No scleral icterus.       Right eye: No discharge.        Left eye: No discharge.     Conjunctiva/sclera:  Conjunctivae normal.  Cardiovascular:     Rate and Rhythm: Normal rate and regular rhythm.     Heart sounds: Murmur heard.     No friction rub. No gallop.  Pulmonary:     Effort: Pulmonary effort is normal. No respiratory distress.     Breath sounds: Normal breath sounds. No wheezing or rhonchi.  Abdominal:     General: There is no distension.     Tenderness: There is no abdominal tenderness. There is no guarding or rebound.  Musculoskeletal:     Cervical back: No rigidity.     Right lower leg: No edema.     Left lower leg: No edema.  Skin:    General: Skin is warm and dry.  Neurological:     General: No focal deficit present.     Mental Status: She is alert and oriented to person, place, and time.  Psychiatric:        Mood and Affect: Mood normal.        Behavior: Behavior normal.        Judgment: Judgment normal.      No  results found for any visits on 01/22/22.    The ASCVD Risk score (Arnett DK, et al., 2019) failed to calculate for the following reasons:   The valid total cholesterol range is 130 to 320 mg/dL    Assessment & Plan:   Lori Ortiz was seen today for hypertension.  Diagnoses and all orders for this visit:  Primary hypertension BP above goal, reports well controlled at home. Managed by nephrology. She will monitor BP at home and notify nephrology for persistently elevated readings.   Mixed hyperlipidemia On lipitor 40. Order for fasting lipid panel given.   Chronic heart failure Permanent atrial fibrillation ([place]) Managed by cardiology. Post watchman device. Euvolemic today.   Class 1 obesity due to excess calories with serious comorbidity and body mass index (BMI) of 33.0 to 33.9 in adult Diet and exercise.   Stage 4 chronic kidney disease (HCC) Anemia due to stage 4 chronic kidney disease ([place]) Managed by nephrology.   History of hemorrhagic cerebrovascular accident (CVA) without residual deficits  OSA on CPAP Managed by [facility].    Gastroesophageal reflux disease without esophagitis Well controlled on current regimen. Continue protonix.   Need for vaccination -     Pneumococcal conjugate vaccine 20-valent (Prevnar 20)  Return in about 6 months (around 07/23/2022) for CPE.   The patient indicates understanding of these issues and agrees with the plan.   Gwenlyn Perking, FNP

## 2022-01-23 ENCOUNTER — Ambulatory Visit: Payer: PRIVATE HEALTH INSURANCE | Admitting: Nurse Practitioner

## 2022-01-27 DIAGNOSIS — G4733 Obstructive sleep apnea (adult) (pediatric): Secondary | ICD-10-CM | POA: Diagnosis not present

## 2022-01-28 ENCOUNTER — Encounter (HOSPITAL_COMMUNITY)
Admission: RE | Admit: 2022-01-28 | Discharge: 2022-01-28 | Disposition: A | Discharge status: Home / Self Care | Payer: Medicare Other | Source: Ambulatory Visit | Attending: Nephrology | Admitting: Nephrology

## 2022-01-28 VITALS — BP 160/63 | HR 60 | Temp 97.4°F | Resp 20

## 2022-01-28 DIAGNOSIS — N184 Chronic kidney disease, stage 4 (severe): Secondary | ICD-10-CM | POA: Insufficient documentation

## 2022-01-28 DIAGNOSIS — D631 Anemia in chronic kidney disease: Secondary | ICD-10-CM | POA: Diagnosis not present

## 2022-01-28 LAB — POCT HEMOGLOBIN-HEMACUE: Hemoglobin: 10.2 g/dL — ABNORMAL LOW (ref 12.0–15.0)

## 2022-01-28 MED ORDER — EPOETIN ALFA-EPBX 3000 UNIT/ML IJ SOLN
3000.0000 [IU] | Freq: Once | INTRAMUSCULAR | Status: AC
  Administered 2022-01-28: 3000 [IU] via SUBCUTANEOUS
  Filled 2022-01-28: qty 1

## 2022-01-28 NOTE — Progress Notes (Signed)
Diagnosis: Anemia in Chronic Kidney Disease  Provider:  Manpreet Bhutani MD  Procedure: Injection  Retacrit (epoetin alfa-epbx), Dose: 3000 Units, Site: subcutaneous, Number of injections: 1 HGB 10.2 Post Care: Observation period completed  Discharge: Condition: Good, Destination: Home . AVS provided to patient.   Performed by:  Hughie Closs, RN

## 2022-02-03 ENCOUNTER — Encounter: Payer: Self-pay | Admitting: Family Medicine

## 2022-02-03 DIAGNOSIS — I129 Hypertensive chronic kidney disease with stage 1 through stage 4 chronic kidney disease, or unspecified chronic kidney disease: Secondary | ICD-10-CM | POA: Diagnosis not present

## 2022-02-03 DIAGNOSIS — R808 Other proteinuria: Secondary | ICD-10-CM | POA: Diagnosis not present

## 2022-02-03 DIAGNOSIS — N170 Acute kidney failure with tubular necrosis: Secondary | ICD-10-CM | POA: Diagnosis not present

## 2022-02-03 DIAGNOSIS — I10 Essential (primary) hypertension: Secondary | ICD-10-CM | POA: Diagnosis not present

## 2022-02-03 DIAGNOSIS — N19 Unspecified kidney failure: Secondary | ICD-10-CM | POA: Diagnosis not present

## 2022-02-05 DIAGNOSIS — G4733 Obstructive sleep apnea (adult) (pediatric): Secondary | ICD-10-CM | POA: Diagnosis not present

## 2022-02-08 DIAGNOSIS — I129 Hypertensive chronic kidney disease with stage 1 through stage 4 chronic kidney disease, or unspecified chronic kidney disease: Secondary | ICD-10-CM | POA: Diagnosis not present

## 2022-02-08 DIAGNOSIS — R808 Other proteinuria: Secondary | ICD-10-CM | POA: Diagnosis not present

## 2022-02-08 DIAGNOSIS — E211 Secondary hyperparathyroidism, not elsewhere classified: Secondary | ICD-10-CM | POA: Diagnosis not present

## 2022-02-08 DIAGNOSIS — I5032 Chronic diastolic (congestive) heart failure: Secondary | ICD-10-CM | POA: Diagnosis not present

## 2022-02-08 DIAGNOSIS — D638 Anemia in other chronic diseases classified elsewhere: Secondary | ICD-10-CM | POA: Diagnosis not present

## 2022-02-08 DIAGNOSIS — N19 Unspecified kidney failure: Secondary | ICD-10-CM | POA: Diagnosis not present

## 2022-02-10 DIAGNOSIS — G4733 Obstructive sleep apnea (adult) (pediatric): Secondary | ICD-10-CM | POA: Diagnosis not present

## 2022-02-11 ENCOUNTER — Encounter (HOSPITAL_COMMUNITY)
Admission: RE | Admit: 2022-02-11 | Discharge: 2022-02-11 | Disposition: A | Discharge status: Home / Self Care | Payer: Medicare Other | Source: Ambulatory Visit | Attending: Nephrology | Admitting: Nephrology

## 2022-02-11 VITALS — BP 177/92 | HR 58 | Temp 98.3°F | Resp 16

## 2022-02-11 DIAGNOSIS — N184 Chronic kidney disease, stage 4 (severe): Secondary | ICD-10-CM

## 2022-02-11 DIAGNOSIS — D631 Anemia in chronic kidney disease: Secondary | ICD-10-CM | POA: Diagnosis not present

## 2022-02-11 LAB — POCT HEMOGLOBIN-HEMACUE: Hemoglobin: 9.9 g/dL — ABNORMAL LOW (ref 12.0–15.0)

## 2022-02-11 MED ORDER — EPOETIN ALFA-EPBX 3000 UNIT/ML IJ SOLN
3000.0000 [IU] | Freq: Once | INTRAMUSCULAR | Status: AC
  Administered 2022-02-11: 3000 [IU] via SUBCUTANEOUS
  Filled 2022-02-11: qty 1

## 2022-02-11 NOTE — Progress Notes (Signed)
Diagnosis: Anemia in Chronic Kidney Disease  Provider:  Manpreet Bhutani MD  Procedure: Injection  Retacrit (epoetin alfa-epbx), Dose: 3000 Units, Site: subcutaneous, Number of injections: 1  HGB 9.9  Post Care: Patient declined observation  Discharge: Condition: Good, Destination: Home . AVS provided to patient.   Performed by:  Pecolia Marando E, RN        

## 2022-02-11 NOTE — Addendum Note (Signed)
Encounter addended by: Abrianna Sidman E, RN on: 02/11/2022 11:01 AM  Actions taken: Therapy plan modified

## 2022-02-21 DIAGNOSIS — E211 Secondary hyperparathyroidism, not elsewhere classified: Secondary | ICD-10-CM | POA: Diagnosis not present

## 2022-02-21 DIAGNOSIS — I129 Hypertensive chronic kidney disease with stage 1 through stage 4 chronic kidney disease, or unspecified chronic kidney disease: Secondary | ICD-10-CM | POA: Diagnosis not present

## 2022-02-21 DIAGNOSIS — I5032 Chronic diastolic (congestive) heart failure: Secondary | ICD-10-CM | POA: Diagnosis not present

## 2022-02-21 DIAGNOSIS — R808 Other proteinuria: Secondary | ICD-10-CM | POA: Diagnosis not present

## 2022-02-25 ENCOUNTER — Encounter (HOSPITAL_COMMUNITY): Payer: Self-pay | Admitting: Nephrology

## 2022-02-25 ENCOUNTER — Encounter (HOSPITAL_COMMUNITY)
Admission: RE | Admit: 2022-02-25 | Discharge: 2022-02-25 | Disposition: A | Discharge status: Home / Self Care | Payer: Medicare Other | Source: Ambulatory Visit | Attending: Nephrology | Admitting: Nephrology

## 2022-02-25 VITALS — BP 172/64 | HR 56 | Temp 97.5°F

## 2022-02-25 DIAGNOSIS — D631 Anemia in chronic kidney disease: Secondary | ICD-10-CM | POA: Diagnosis not present

## 2022-02-25 DIAGNOSIS — N184 Chronic kidney disease, stage 4 (severe): Secondary | ICD-10-CM | POA: Insufficient documentation

## 2022-02-25 LAB — POCT HEMOGLOBIN-HEMACUE: Hemoglobin: 10.1 g/dL — ABNORMAL LOW (ref 12.0–15.0)

## 2022-02-25 MED ORDER — EPOETIN ALFA-EPBX 3000 UNIT/ML IJ SOLN: 3000.0000 [IU] | Freq: Once | INTRAMUSCULAR | Status: DC

## 2022-02-25 NOTE — Addendum Note (Signed)
Encounter addended by: Baxter Hire, RN on: 02/25/2022 11:05 AM  Actions taken: Therapy plan modified

## 2022-02-25 NOTE — Progress Notes (Signed)
Diagnosis: Anemia in Chronic Kidney Disease  Provider:  Manpreet Bhutani MD  Procedure: Injection  Retacrit (epoetin alfa-epbx),  Retacrit not given  Hgb 10.1  Post Care: Observation period completed  Discharge: Condition: Good, Destination: Home . AVS provided to patient.   Performed by:  Oren Beckmann, RN

## 2022-03-08 DIAGNOSIS — G4733 Obstructive sleep apnea (adult) (pediatric): Secondary | ICD-10-CM | POA: Diagnosis not present

## 2022-03-11 ENCOUNTER — Encounter (HOSPITAL_COMMUNITY)
Admission: RE | Admit: 2022-03-11 | Discharge: 2022-03-11 | Disposition: A | Discharge status: Home / Self Care | Payer: Medicare Other | Source: Ambulatory Visit | Attending: Nephrology | Admitting: Nephrology

## 2022-03-11 VITALS — BP 145/55 | HR 55 | Temp 97.4°F | Resp 16

## 2022-03-11 DIAGNOSIS — D631 Anemia in chronic kidney disease: Secondary | ICD-10-CM

## 2022-03-11 DIAGNOSIS — N184 Chronic kidney disease, stage 4 (severe): Secondary | ICD-10-CM | POA: Diagnosis not present

## 2022-03-11 LAB — POCT HEMOGLOBIN-HEMACUE: Hemoglobin: 10.1 g/dL — ABNORMAL LOW (ref 12.0–15.0)

## 2022-03-11 MED ORDER — EPOETIN ALFA-EPBX 3000 UNIT/ML IJ SOLN: 3000.0000 [IU] | Freq: Once | INTRAMUSCULAR | Status: DC

## 2022-03-11 NOTE — Progress Notes (Signed)
Patient did not receive retacrit injection today. HGB 10.1.

## 2022-03-11 NOTE — Addendum Note (Signed)
Encounter addended by: Lilly Cove, RPH on: 03/11/2022 11:16 AM  Actions taken: Order list changed

## 2022-03-25 ENCOUNTER — Encounter (HOSPITAL_COMMUNITY)
Admission: RE | Admit: 2022-03-25 | Discharge: 2022-03-25 | Disposition: A | Discharge status: Home / Self Care | Payer: Medicare Other | Source: Ambulatory Visit | Attending: Nephrology | Admitting: Nephrology

## 2022-03-25 VITALS — BP 159/55 | HR 58 | Temp 97.5°F | Resp 16

## 2022-03-25 DIAGNOSIS — N184 Chronic kidney disease, stage 4 (severe): Secondary | ICD-10-CM | POA: Diagnosis not present

## 2022-03-25 DIAGNOSIS — D631 Anemia in chronic kidney disease: Secondary | ICD-10-CM | POA: Diagnosis not present

## 2022-03-25 LAB — PROTEIN / CREATININE RATIO, URINE
Creatinine, Urine: 33.06 mg/dL
Protein Creatinine Ratio: 1.21 mg/mg{Cre} — ABNORMAL HIGH (ref 0.00–0.15)
Total Protein, Urine: 40 mg/dL

## 2022-03-25 LAB — CBC WITH DIFFERENTIAL/PLATELET
Abs Immature Granulocytes: 0.02 10*3/uL (ref 0.00–0.07)
Basophils Absolute: 0.1 10*3/uL (ref 0.0–0.1)
Basophils Relative: 1 %
Eosinophils Absolute: 0.2 10*3/uL (ref 0.0–0.5)
Eosinophils Relative: 4 %
HCT: 31.8 % — ABNORMAL LOW (ref 36.0–46.0)
Hemoglobin: 10.2 g/dL — ABNORMAL LOW (ref 12.0–15.0)
Immature Granulocytes: 0 %
Lymphocytes Relative: 17 %
Lymphs Abs: 0.9 10*3/uL (ref 0.7–4.0)
MCH: 30.3 pg (ref 26.0–34.0)
MCHC: 32.1 g/dL (ref 30.0–36.0)
MCV: 94.4 fL (ref 80.0–100.0)
Monocytes Absolute: 0.4 10*3/uL (ref 0.1–1.0)
Monocytes Relative: 8 %
Neutro Abs: 3.6 10*3/uL (ref 1.7–7.7)
Neutrophils Relative %: 70 %
Platelets: 237 10*3/uL (ref 150–400)
RBC: 3.37 MIL/uL — ABNORMAL LOW (ref 3.87–5.11)
RDW: 12.9 % (ref 11.5–15.5)
WBC: 5.1 10*3/uL (ref 4.0–10.5)
nRBC: 0 % (ref 0.0–0.2)

## 2022-03-25 LAB — RENAL FUNCTION PANEL
Albumin: 3.7 g/dL (ref 3.5–5.0)
Anion gap: 11 (ref 5–15)
BUN: 40 mg/dL — ABNORMAL HIGH (ref 8–23)
CO2: 21 mmol/L — ABNORMAL LOW (ref 22–32)
Calcium: 9.2 mg/dL (ref 8.9–10.3)
Chloride: 106 mmol/L (ref 98–111)
Creatinine, Ser: 3.19 mg/dL — ABNORMAL HIGH (ref 0.44–1.00)
GFR, Estimated: 15 mL/min — ABNORMAL LOW (ref >60)
Glucose, Bld: 99 mg/dL (ref 70–99)
Phosphorus: 4 mg/dL (ref 2.5–4.6)
Potassium: 3.9 mmol/L (ref 3.5–5.1)
Sodium: 138 mmol/L (ref 135–145)

## 2022-03-25 LAB — POCT HEMOGLOBIN-HEMACUE: Hemoglobin: 11 g/dL — ABNORMAL LOW (ref 12.0–15.0)

## 2022-03-25 MED ORDER — EPOETIN ALFA-EPBX 3000 UNIT/ML IJ SOLN: 3000.0000 [IU] | Freq: Once | INTRAMUSCULAR | Status: DC

## 2022-03-25 NOTE — Progress Notes (Signed)
Diagnosis: Anemia in Chronic Kidney Disease  Provider:  Manpreet Bhutani MD  Procedure: Injection  Retacrit (epoetin alfa-epbx), Dose: 3000 Units, Site: subcutaneous, Number of injections: NOT GIVEN  HGB 11.0  Post Care: Patient declined observation  Discharge: Condition: Good, Destination: Home . AVS provided to patient.   Performed by:  Baxter Hire, RN

## 2022-04-01 ENCOUNTER — Other Ambulatory Visit: Payer: Self-pay | Admitting: Nurse Practitioner

## 2022-04-01 DIAGNOSIS — K219 Gastro-esophageal reflux disease without esophagitis: Secondary | ICD-10-CM

## 2022-04-08 ENCOUNTER — Encounter (HOSPITAL_COMMUNITY)
Admission: RE | Admit: 2022-04-08 | Discharge: 2022-04-08 | Disposition: A | Discharge status: Home / Self Care | Payer: Medicare Other | Source: Ambulatory Visit | Attending: Nephrology | Admitting: Nephrology

## 2022-04-08 VITALS — BP 154/57 | HR 60 | Temp 97.5°F | Resp 20

## 2022-04-08 DIAGNOSIS — N184 Chronic kidney disease, stage 4 (severe): Secondary | ICD-10-CM | POA: Diagnosis not present

## 2022-04-08 DIAGNOSIS — D631 Anemia in chronic kidney disease: Secondary | ICD-10-CM | POA: Insufficient documentation

## 2022-04-08 DIAGNOSIS — G4733 Obstructive sleep apnea (adult) (pediatric): Secondary | ICD-10-CM | POA: Diagnosis not present

## 2022-04-08 LAB — POCT HEMOGLOBIN-HEMACUE: Hemoglobin: 10.7 g/dL — ABNORMAL LOW (ref 12.0–15.0)

## 2022-04-08 MED ORDER — EPOETIN ALFA-EPBX 3000 UNIT/ML IJ SOLN: 3000.0000 [IU] | Freq: Once | INTRAMUSCULAR | Status: DC

## 2022-04-08 NOTE — Progress Notes (Signed)
Diagnosis: Anemia in Chronic Kidney Disease  Provider:  Manpreet Bhutani MD  Procedure: Injection  Retacrit (epoetin alfa-epbx), Dose: 3000 Units, Site: subcutaneous, Number of injections: NOT GIVEN  Hgb 10.7  Post Care: Patient declined observation  Discharge: Condition: Good, Destination: Home . AVS Provided  Performed by:  Baxter Hire, RN

## 2022-04-11 DIAGNOSIS — I129 Hypertensive chronic kidney disease with stage 1 through stage 4 chronic kidney disease, or unspecified chronic kidney disease: Secondary | ICD-10-CM | POA: Diagnosis not present

## 2022-04-11 DIAGNOSIS — I5032 Chronic diastolic (congestive) heart failure: Secondary | ICD-10-CM | POA: Diagnosis not present

## 2022-04-11 DIAGNOSIS — E211 Secondary hyperparathyroidism, not elsewhere classified: Secondary | ICD-10-CM | POA: Diagnosis not present

## 2022-04-11 DIAGNOSIS — R808 Other proteinuria: Secondary | ICD-10-CM | POA: Diagnosis not present

## 2022-04-17 DIAGNOSIS — I129 Hypertensive chronic kidney disease with stage 1 through stage 4 chronic kidney disease, or unspecified chronic kidney disease: Secondary | ICD-10-CM | POA: Diagnosis not present

## 2022-04-17 DIAGNOSIS — R808 Other proteinuria: Secondary | ICD-10-CM | POA: Diagnosis not present

## 2022-04-17 DIAGNOSIS — D638 Anemia in other chronic diseases classified elsewhere: Secondary | ICD-10-CM | POA: Diagnosis not present

## 2022-04-17 DIAGNOSIS — I5032 Chronic diastolic (congestive) heart failure: Secondary | ICD-10-CM | POA: Diagnosis not present

## 2022-04-17 DIAGNOSIS — E211 Secondary hyperparathyroidism, not elsewhere classified: Secondary | ICD-10-CM | POA: Diagnosis not present

## 2022-04-22 ENCOUNTER — Encounter (HOSPITAL_COMMUNITY)
Admission: RE | Admit: 2022-04-22 | Discharge: 2022-04-22 | Disposition: A | Discharge status: Home / Self Care | Payer: Medicare Other | Source: Ambulatory Visit | Attending: Nephrology | Admitting: Nephrology

## 2022-04-22 VITALS — BP 163/56 | HR 61 | Temp 97.7°F | Resp 16

## 2022-04-22 DIAGNOSIS — D631 Anemia in chronic kidney disease: Secondary | ICD-10-CM

## 2022-04-22 DIAGNOSIS — N184 Chronic kidney disease, stage 4 (severe): Secondary | ICD-10-CM | POA: Diagnosis not present

## 2022-04-22 LAB — POCT HEMOGLOBIN-HEMACUE: Hemoglobin: 11 g/dL — ABNORMAL LOW (ref 12.0–15.0)

## 2022-04-22 MED ORDER — EPOETIN ALFA-EPBX 3000 UNIT/ML IJ SOLN: 3000.0000 [IU] | Freq: Once | INTRAMUSCULAR | Status: DC

## 2022-04-22 NOTE — Addendum Note (Signed)
Encounter addended by: Baxter Hire, RN on: 04/22/2022 1:01 PM  Actions taken: Therapy plan modified

## 2022-04-22 NOTE — Progress Notes (Signed)
Diagnosis: Anemia in Chronic Kidney Disease  Provider:  Manpreet Bhutani MD  Procedure: Injection  Retacrit (epoetin alfa-epbx), Dose: 3000 Units, Site: subcutaneous, Number of injections: NOT GIVEN   Hgb 11.0  Post Care: Patient declined observation  Discharge: Condition: Good, Destination: Home . AVS Provided  Performed by:  Grayland Ormond, RN

## 2022-04-22 NOTE — Addendum Note (Signed)
Encounter addended by: Baxter Hire, RN on: 04/22/2022 1:43 PM  Actions taken: MAR administration accepted

## 2022-04-22 NOTE — Addendum Note (Signed)
Encounter addended by: Cristy Friedlander, [facility] on: 04/22/2022 11:20 AM  Actions taken: Order list changed

## 2022-04-23 ENCOUNTER — Other Ambulatory Visit: Payer: Self-pay | Admitting: Nurse Practitioner

## 2022-04-23 ENCOUNTER — Encounter: Payer: Self-pay | Admitting: Family Medicine

## 2022-04-23 ENCOUNTER — Ambulatory Visit (INDEPENDENT_AMBULATORY_CARE_PROVIDER_SITE_OTHER): Payer: Medicare Other | Admitting: Family Medicine

## 2022-04-23 ENCOUNTER — Ambulatory Visit (INDEPENDENT_AMBULATORY_CARE_PROVIDER_SITE_OTHER): Payer: Medicare Other

## 2022-04-23 VITALS — BP 144/54 | HR 57 | Temp 97.6°F | Ht 69.0 in | Wt 226.0 lb

## 2022-04-23 DIAGNOSIS — R0602 Shortness of breath: Secondary | ICD-10-CM

## 2022-04-23 DIAGNOSIS — J988 Other specified respiratory disorders: Secondary | ICD-10-CM

## 2022-04-23 DIAGNOSIS — R051 Acute cough: Secondary | ICD-10-CM

## 2022-04-23 DIAGNOSIS — E782 Mixed hyperlipidemia: Secondary | ICD-10-CM

## 2022-04-23 DIAGNOSIS — R059 Cough, unspecified: Secondary | ICD-10-CM | POA: Diagnosis not present

## 2022-04-23 MED ORDER — AMOXICILLIN-POT CLAVULANATE 875-125 MG PO TABS: 1.0000 | ORAL_TABLET | Freq: Two times a day (BID) | ORAL | 0 refills | Status: AC

## 2022-04-23 NOTE — Progress Notes (Signed)
Acute Office Visit  Subjective:     Patient ID: Lori Ortiz, female    DOB: June 14, 1955, 67 y.o.   MRN: [id number]  Chief Complaint  Patient presents with   Cough   Shortness of Breath    Cough This is a new problem. Episode onset: 10 days. The problem has been unchanged. The cough is Non-productive. Associated symptoms include rhinorrhea, a sore throat, shortness of breath and wheezing. Pertinent negatives include no chest pain, chills, ear congestion, ear pain, fever, headaches or nasal congestion. Associated symptoms comments: fatigue. The symptoms are aggravated by lying down and exercise. She has tried OTC cough suppressant for the symptoms. The treatment provided mild relief. There is no history of asthma, bronchitis, COPD or pneumonia.  Denies edema. Denies weight gain.    Review of Systems  Constitutional:  Negative for chills and fever.  HENT:  Positive for rhinorrhea and sore throat. Negative for ear pain.   Respiratory:  Positive for shortness of breath and wheezing.   Cardiovascular:  Negative for chest pain.  Neurological:  Negative for headaches.        Objective:    BP (!) 144/54   Pulse (!) 57   Temp 97.6 F (36.4 C) (Temporal)   Ht '5\' 9"'$  (1.753 m)   Wt 226 lb (102.5 kg)   SpO2 95%   BMI 33.37 kg/m  Wt Readings from Last 3 Encounters:  04/23/22 226 lb (102.5 kg)  01/22/22 225 lb 4 oz (102.2 kg)  11/11/21 210 lb (95.3 kg)      Physical Exam Vitals and nursing note reviewed.  Constitutional:      General: She is not in acute distress.    Appearance: She is well-developed. She is not ill-appearing, toxic-appearing or diaphoretic.  HENT:     Right Ear: Tympanic membrane, ear canal and external ear normal.     Left Ear: Tympanic membrane, ear canal and external ear normal.     Nose: Congestion present.     Mouth/Throat:     Mouth: Mucous membranes are moist.     Pharynx: Oropharynx is clear. No pharyngeal swelling, oropharyngeal exudate or posterior  oropharyngeal erythema.  Eyes:     General:        Right eye: No discharge.        Left eye: No discharge.     Conjunctiva/sclera: Conjunctivae normal.  Cardiovascular:     Rate and Rhythm: Normal rate and regular rhythm.     Heart sounds: Murmur heard.     Systolic murmur is present with a grade of 3/6.     No S3 or S4 sounds.  Pulmonary:     Effort: Pulmonary effort is normal.     Breath sounds: Examination of the right-lower field reveals rhonchi. Rhonchi present. No decreased breath sounds, wheezing or rales.  Abdominal:     General: Bowel sounds are normal. There is no distension.     Palpations: Abdomen is soft.     Tenderness: There is no abdominal tenderness. There is no right CVA tenderness or guarding.  Musculoskeletal:     Right lower leg: No edema.     Left lower leg: No edema.  Skin:    General: Skin is warm and dry.  Neurological:     General: No focal deficit present.     Mental Status: She is alert and oriented to person, place, and time.  Psychiatric:        Mood and Affect: Mood normal.  Behavior: Behavior normal.     No results found for any visits on 04/23/22.      Assessment & Plan:   Lori Ortiz was seen today for cough and shortness of breath.  Diagnoses and all orders for this visit:  Respiratory infection CXR negative for fluid or PNA- agree with radiology reports. Augmentin as below. Discussed symptomatic care and return precautions.  -     DG Chest 2 View; Future -     amoxicillin-clavulanate (AUGMENTIN) 875-125 MG tablet; Take 1 tablet by mouth 2 (two) times daily for 7 days.  Return to office for new or worsening symptoms, or if symptoms persist.   The patient indicates understanding of these issues and agrees with the plan.   Gwenlyn Perking, FNP

## 2022-05-06 ENCOUNTER — Encounter (HOSPITAL_COMMUNITY)
Admission: RE | Admit: 2022-05-06 | Discharge: 2022-05-06 | Disposition: A | Discharge status: Home / Self Care | Payer: Medicare Other | Source: Ambulatory Visit | Attending: Nephrology | Admitting: Nephrology

## 2022-05-06 VITALS — BP 170/58 | HR 59 | Temp 97.4°F | Resp 20

## 2022-05-06 DIAGNOSIS — D631 Anemia in chronic kidney disease: Secondary | ICD-10-CM | POA: Diagnosis not present

## 2022-05-06 DIAGNOSIS — N184 Chronic kidney disease, stage 4 (severe): Secondary | ICD-10-CM | POA: Diagnosis not present

## 2022-05-06 LAB — POCT HEMOGLOBIN-HEMACUE: Hemoglobin: 8.1 g/dL — ABNORMAL LOW (ref 12.0–15.0)

## 2022-05-06 MED ORDER — EPOETIN ALFA-EPBX 3000 UNIT/ML IJ SOLN
3000.0000 [IU] | Freq: Once | INTRAMUSCULAR | Status: AC
  Administered 2022-05-06: 3000 [IU] via SUBCUTANEOUS
  Filled 2022-05-06: qty 1

## 2022-05-06 NOTE — Addendum Note (Signed)
Encounter addended by: Baxter Hire, RN on: 05/06/2022 1:54 PM  Actions taken: Therapy plan modified

## 2022-05-06 NOTE — Progress Notes (Signed)
Diagnosis: Anemia in Chronic Kidney Disease  Provider:  Manpreet Bhutani MD  Procedure: Injection  Retacrit (epoetin alfa-epbx), Dose: 3000 Units, Site: subcutaneous, Number of injections: 1  Post Care: Patient declined observation  Discharge: Condition: Good, Destination: Home . AVS Provided  Performed by:  Kennith Maes, RN   Injection in Right arm.  Hgb 8.1

## 2022-05-07 DIAGNOSIS — G4733 Obstructive sleep apnea (adult) (pediatric): Secondary | ICD-10-CM | POA: Diagnosis not present

## 2022-05-13 DIAGNOSIS — G4733 Obstructive sleep apnea (adult) (pediatric): Secondary | ICD-10-CM | POA: Diagnosis not present

## 2022-05-14 ENCOUNTER — Encounter: Payer: Self-pay | Admitting: Vascular Surgery

## 2022-05-14 ENCOUNTER — Ambulatory Visit: Payer: Medicare Other | Admitting: Vascular Surgery

## 2022-05-14 VITALS — BP 169/75 | HR 51 | Temp 97.9°F | Ht 69.0 in | Wt 233.4 lb

## 2022-05-14 DIAGNOSIS — N184 Chronic kidney disease, stage 4 (severe): Secondary | ICD-10-CM

## 2022-05-14 NOTE — Progress Notes (Signed)
[facility] [place]  Patient name: Lori Ortiz MRN: [id number] DOB: 1955-07-28 Sex: female  REASON FOR CONSULT: Discuss access for hemodialysis  HPI: Lori Ortiz is a 67 y.o. female, who is here today for discussion of access for hemodialysis.  She is a patient of Dr. Theador Hawthorne.  She has had progression to CKD 4.  Her most recent creatinine was 3.0.  She is right-handed.  She does not have a pacemaker.  She is not on anticoagulant.  She did recently have a Watchman procedure.  Her mother was on hemodialysis that she is somewhat familiar.  Past Medical History:  Diagnosis Date   Cardiac arrhythmia due to congenital heart disease    GERD (gastroesophageal reflux disease)    Hypertension    Sleep apnea    Stroke ([place])     Family History  Problem Relation Age of Onset   Heart disease Mother    Diabetes Mother    Aneurysm Father    Hypertension Father    Cancer Sister    Hypertension Brother    Stomach cancer Neg Hx    Esophageal cancer Neg Hx    Colon cancer Neg Hx     SOCIAL HISTORY: Social History   Socioeconomic History   Marital status: Single    Spouse name: Not on file   Number of children: 0   Years of education: Not on file   Highest education level: Not on file  Occupational History   Occupation: retired  Tobacco Use   Smoking status: Every Day    Packs/day: .5    Types: Cigarettes   Smokeless tobacco: Never  Vaping Use   Vaping Use: Never used  Substance and Sexual Activity   Alcohol use: No   Drug use: No   Sexual activity: Not on file  Other Topics Concern   Not on file  Social History Narrative   Not on file   Social Determinants of Health   Financial Resource Strain: Not on file  Food Insecurity: Not on file  Transportation Needs: Not on file  Physical Activity: Not on file  Stress: Not on file  Social Connections: Not on file  Intimate Partner Violence: Not on file    No Known  Allergies  Current Outpatient Medications  Medication Sig Dispense Refill   amLODipine (NORVASC) 5 MG tablet Take 1 tablet (5 mg total) by mouth daily. 30 tablet 11   aspirin EC 81 MG tablet Take 81 mg by mouth daily. Swallow whole.     atorvastatin (LIPITOR) 40 MG tablet TAKE 1 TABLET BY MOUTH EVERY DAY 90 tablet 0   Cholecalciferol (VITAMIN D) 50 MCG (2000 UT) tablet Take 2,000 Units by mouth daily.     epoetin alfa (EPOGEN) 3000 UNIT/ML injection Inject into the skin.     ferrous sulfate 325 (65 FE) MG EC tablet TAKE 1 TABLET BY MOUTH EVERY DAY 90 tablet 0   hydrALAZINE (APRESOLINE) 25 MG tablet Take 1 tablet (25 mg total) by mouth 3 (three) times daily. (Patient taking differently: Take 100 mg by mouth 3 (three) times daily.) 90 tablet 5   isosorbide dinitrate (ISORDIL) 10 MG tablet Take 2 tablets (20 mg total) by mouth 3 (three) times daily. 180 tablet 11   pantoprazole (PROTONIX) 40 MG tablet TAKE 1 TABLET BY MOUTH EVERY DAY 90 tablet 0   telmisartan (MICARDIS) 20 MG tablet Take 10 mg by mouth daily.     torsemide (DEMADEX) 20 MG tablet Take  2 tablets (40 mg total) by mouth daily. 180 tablet 1   No current facility-administered medications for this visit.    REVIEW OF SYSTEMS:  [X]  denotes positive finding, [ ]  denotes negative finding Cardiac  Comments:  Chest pain or chest pressure:    Shortness of breath upon exertion: x   Short of breath when lying flat:    Irregular heart rhythm: x       Vascular    Pain in calf, thigh, or hip brought on by ambulation:    Pain in feet at night that wakes you up from your sleep:     Blood clot in your veins:    Leg swelling:         Pulmonary    Oxygen at home:    Productive cough:     Wheezing:         Neurologic    Sudden weakness in arms or legs:     Sudden numbness in arms or legs:     Sudden onset of difficulty speaking or slurred speech:    Temporary loss of vision in one eye:     Problems with dizziness:          Gastrointestinal    Blood in stool:     Vomited blood:         Genitourinary    Burning when urinating:     Blood in urine:        Psychiatric    Major depression:         Hematologic    Bleeding problems:    Problems with blood clotting too easily:        Skin    Rashes or ulcers:        Constitutional    Fever or chills:      PHYSICAL EXAM: Vitals:   05/14/22 0853  BP: (!) 169/75  Pulse: (!) 51  Temp: 97.9 F (36.6 C)  SpO2: 96%  Weight: 233 lb 6.4 oz (105.9 kg)  Height: 5\' 9"  (1.753 m)    GENERAL: The patient is a well-nourished female, in no acute distress. The vital signs are documented above. CARDIOVASCULAR: 2+ radial pulses bilaterally.  Small surface veins bilaterally. PULMONARY: There is good air exchange  MUSCULOSKELETAL: There are no major deformities or cyanosis. NEUROLOGIC: No focal weakness or paresthesias are detected. SKIN: There are no ulcers or rashes noted. PSYCHIATRIC: The patient has a normal affect.  DATA:  I imaged both arms with SonoSite ultrasound.  This shows a very small cephalic vein in the right arm.  Moderate to large basilic vein in the right arm.  On the left arm she has a small forearm cephalic vein.  The vein is better caliber at the antecubital space but is deep and small with multiple branches in the left upper arm.  She does have a good caliber btasilic vein on the left.  There is branching above the elbow with predominant branch close o her brachial artery.  MEDICAL ISSUES: Had long discussion with the patient regarding options for hemodialysis.  I discussed tunneled catheter for acute dialysis with the risk of long-term use of a catheter.  Also discussed AV graft and AV fistula.  She does appear to be a good candidate for basilic vein fistula attempt in her left arm.  I explained that this would be a two-stage process with initially brachiobasilic fistula.  We would then see her back in the office in 4 to 6 weeks with a  duplex  to determine if she is having adequate maturation of her fistula.  We would then proceed with second stage transposition.  Wishes to schedule for stage I 05/27/2022 at [place], MD [facility] Tel [phone] Pager [phone]  Note: Portions of this report may have been transcribed using voice recognition software.  Every effort has been made to ensure accuracy; however, inadvertent computerized transcription errors may still be present.

## 2022-05-16 ENCOUNTER — Other Ambulatory Visit: Payer: Self-pay

## 2022-05-16 DIAGNOSIS — N184 Chronic kidney disease, stage 4 (severe): Secondary | ICD-10-CM

## 2022-05-20 ENCOUNTER — Encounter (HOSPITAL_COMMUNITY)
Admission: RE | Admit: 2022-05-20 | Discharge: 2022-05-20 | Disposition: A | Discharge status: Home / Self Care | Payer: Medicare Other | Source: Ambulatory Visit | Attending: Nephrology | Admitting: Nephrology

## 2022-05-20 VITALS — BP 183/55 | HR 83 | Temp 97.3°F | Resp 24

## 2022-05-20 DIAGNOSIS — N184 Chronic kidney disease, stage 4 (severe): Secondary | ICD-10-CM

## 2022-05-20 DIAGNOSIS — D631 Anemia in chronic kidney disease: Secondary | ICD-10-CM | POA: Diagnosis not present

## 2022-05-20 LAB — POCT HEMOGLOBIN-HEMACUE: Hemoglobin: 9.4 g/dL — ABNORMAL LOW (ref 12.0–15.0)

## 2022-05-20 MED ORDER — EPOETIN ALFA-EPBX 3000 UNIT/ML IJ SOLN
3000.0000 [IU] | Freq: Once | INTRAMUSCULAR | Status: AC
  Administered 2022-05-20: 3000 [IU] via SUBCUTANEOUS
  Filled 2022-05-20: qty 1

## 2022-05-20 NOTE — Progress Notes (Signed)
Diagnosis: Anemia in Chronic Kidney Disease  Provider:  Manpreet Bhutani MD  Procedure: Injection  Retacrit (epoetin alfa-epbx), Dose: 3000 Units, Site: subcutaneous, Number of injections: 1  Hgb 9.4. Administered in left arm.  Post Care: Patient declined observation  Discharge: Condition: Good, Destination: Home . AVS Provided  Performed by:  Binnie Kand, RN

## 2022-05-26 DIAGNOSIS — D638 Anemia in other chronic diseases classified elsewhere: Secondary | ICD-10-CM | POA: Diagnosis not present

## 2022-05-26 DIAGNOSIS — R808 Other proteinuria: Secondary | ICD-10-CM | POA: Diagnosis not present

## 2022-05-26 DIAGNOSIS — I129 Hypertensive chronic kidney disease with stage 1 through stage 4 chronic kidney disease, or unspecified chronic kidney disease: Secondary | ICD-10-CM | POA: Diagnosis not present

## 2022-05-26 DIAGNOSIS — E211 Secondary hyperparathyroidism, not elsewhere classified: Secondary | ICD-10-CM | POA: Diagnosis not present

## 2022-05-26 DIAGNOSIS — I5032 Chronic diastolic (congestive) heart failure: Secondary | ICD-10-CM | POA: Diagnosis not present

## 2022-05-30 DIAGNOSIS — D638 Anemia in other chronic diseases classified elsewhere: Secondary | ICD-10-CM | POA: Diagnosis not present

## 2022-05-30 DIAGNOSIS — I129 Hypertensive chronic kidney disease with stage 1 through stage 4 chronic kidney disease, or unspecified chronic kidney disease: Secondary | ICD-10-CM | POA: Diagnosis not present

## 2022-05-30 DIAGNOSIS — I5032 Chronic diastolic (congestive) heart failure: Secondary | ICD-10-CM | POA: Diagnosis not present

## 2022-05-30 DIAGNOSIS — E211 Secondary hyperparathyroidism, not elsewhere classified: Secondary | ICD-10-CM | POA: Diagnosis not present

## 2022-05-30 DIAGNOSIS — R808 Other proteinuria: Secondary | ICD-10-CM | POA: Diagnosis not present

## 2022-06-03 ENCOUNTER — Encounter (HOSPITAL_COMMUNITY)
Admission: RE | Admit: 2022-06-03 | Discharge: 2022-06-03 | Disposition: A | Discharge status: Home / Self Care | Payer: Medicare Other | Source: Ambulatory Visit | Attending: Nephrology | Admitting: Nephrology

## 2022-06-03 VITALS — BP 171/62 | HR 60 | Temp 97.4°F | Resp 20

## 2022-06-03 DIAGNOSIS — N184 Chronic kidney disease, stage 4 (severe): Secondary | ICD-10-CM | POA: Diagnosis not present

## 2022-06-03 DIAGNOSIS — D631 Anemia in chronic kidney disease: Secondary | ICD-10-CM | POA: Insufficient documentation

## 2022-06-03 LAB — POCT HEMOGLOBIN-HEMACUE: Hemoglobin: 10.1 g/dL — ABNORMAL LOW (ref 12.0–15.0)

## 2022-06-03 MED ORDER — EPOETIN ALFA-EPBX 3000 UNIT/ML IJ SOLN: 3000.0000 [IU] | Freq: Once | INTRAMUSCULAR | Status: DC

## 2022-06-03 NOTE — Addendum Note (Signed)
Encounter addended by: Wyvonne Lenz, RN on: 06/03/2022 1:57 PM  Actions taken: Therapy plan modified

## 2022-06-03 NOTE — Progress Notes (Signed)
Diagnosis: Anemia in Chronic Kidney Disease  Provider:  Manpreet Bhutani MD  Procedure: Injection  Retacrit (epoetin alfa-epbx), Dose: 3000 Units, Site: subcutaneous, Number of injections: 0  Hgb 10.1. Injection not administered.  Post Care: Patient declined observation  Discharge: Condition: Good, Destination: Home . AVS Declined  Performed by:  Icyss Skog E Virgel Haro, RN        

## 2022-06-07 DIAGNOSIS — G4733 Obstructive sleep apnea (adult) (pediatric): Secondary | ICD-10-CM | POA: Diagnosis not present

## 2022-06-09 ENCOUNTER — Encounter: Payer: Medicare Other | Admitting: Family Medicine

## 2022-06-17 ENCOUNTER — Encounter (HOSPITAL_COMMUNITY)
Admission: RE | Admit: 2022-06-17 | Discharge: 2022-06-17 | Disposition: A | Discharge status: Home / Self Care | Payer: Medicare Other | Source: Ambulatory Visit | Attending: Nephrology | Admitting: Nephrology

## 2022-06-17 VITALS — BP 138/59 | HR 58 | Temp 97.4°F | Resp 16

## 2022-06-17 DIAGNOSIS — N184 Chronic kidney disease, stage 4 (severe): Secondary | ICD-10-CM | POA: Diagnosis not present

## 2022-06-17 DIAGNOSIS — D631 Anemia in chronic kidney disease: Secondary | ICD-10-CM

## 2022-06-17 LAB — POCT HEMOGLOBIN-HEMACUE: Hemoglobin: 11.3 g/dL — ABNORMAL LOW (ref 12.0–15.0)

## 2022-06-17 MED ORDER — EPOETIN ALFA-EPBX 3000 UNIT/ML IJ SOLN: 3000.0000 [IU] | Freq: Once | INTRAMUSCULAR | Status: DC

## 2022-06-17 NOTE — Addendum Note (Signed)
Encounter addended by: Tad Moore, [facility] on: 06/17/2022 10:51 AM  Actions taken: Order list changed

## 2022-06-17 NOTE — Progress Notes (Signed)
Diagnosis: Anemia in Chronic Kidney Disease  Provider:  Manpreet Bhutani MD  Procedure: Injection  Retacrit (epoetin alfa-epbx), Dose: 3000 Units, Site: subcutaneous, Number of injections: 0  HGB: 11.3, injection not given  Post Care: Patient declined observation  Discharge: Condition: Good, Destination: Home . AVS Provided  Performed by:  Evelena Peat, RN

## 2022-06-17 NOTE — Addendum Note (Signed)
Encounter addended by: Wyvonne Lenz, RN on: 06/17/2022 11:12 AM  Actions taken: Therapy plan modified

## 2022-06-19 ENCOUNTER — Other Ambulatory Visit: Payer: Self-pay | Admitting: Family Medicine

## 2022-06-19 ENCOUNTER — Other Ambulatory Visit: Payer: Self-pay | Admitting: Gastroenterology

## 2022-06-19 DIAGNOSIS — E782 Mixed hyperlipidemia: Secondary | ICD-10-CM

## 2022-06-27 ENCOUNTER — Other Ambulatory Visit: Payer: Self-pay | Admitting: Family Medicine

## 2022-06-27 DIAGNOSIS — K219 Gastro-esophageal reflux disease without esophagitis: Secondary | ICD-10-CM

## 2022-07-01 ENCOUNTER — Encounter (HOSPITAL_COMMUNITY)
Admission: RE | Admit: 2022-07-01 | Discharge: 2022-07-01 | Disposition: A | Discharge status: Home / Self Care | Payer: Medicare Other | Source: Ambulatory Visit | Attending: Nephrology | Admitting: Nephrology

## 2022-07-01 VITALS — BP 130/53 | HR 52 | Resp 22

## 2022-07-01 DIAGNOSIS — E785 Hyperlipidemia, unspecified: Secondary | ICD-10-CM | POA: Diagnosis not present

## 2022-07-01 DIAGNOSIS — I4811 Longstanding persistent atrial fibrillation: Secondary | ICD-10-CM | POA: Diagnosis not present

## 2022-07-01 DIAGNOSIS — I484 Atypical atrial flutter: Secondary | ICD-10-CM | POA: Diagnosis not present

## 2022-07-01 DIAGNOSIS — D631 Anemia in chronic kidney disease: Secondary | ICD-10-CM | POA: Diagnosis not present

## 2022-07-01 DIAGNOSIS — N184 Chronic kidney disease, stage 4 (severe): Secondary | ICD-10-CM

## 2022-07-01 DIAGNOSIS — I5032 Chronic diastolic (congestive) heart failure: Secondary | ICD-10-CM | POA: Diagnosis not present

## 2022-07-01 DIAGNOSIS — I350 Nonrheumatic aortic (valve) stenosis: Secondary | ICD-10-CM | POA: Diagnosis not present

## 2022-07-01 DIAGNOSIS — I10 Essential (primary) hypertension: Secondary | ICD-10-CM | POA: Diagnosis not present

## 2022-07-01 LAB — POCT HEMOGLOBIN-HEMACUE: Hemoglobin: 10.2 g/dL — ABNORMAL LOW (ref 12.0–15.0)

## 2022-07-01 MED ORDER — EPOETIN ALFA-EPBX 4000 UNIT/ML IJ SOLN: 3000.0000 [IU] | Freq: Once | INTRAMUSCULAR | Status: DC

## 2022-07-01 NOTE — Progress Notes (Signed)
Diagnosis: Anemia in Chronic Kidney Disease  Provider:  Manpreet Bhutani MD  Procedure: Injection  Retacrit (epoetin alfa-epbx), Dose: 3000 Units, Site: subcutaneous, Number of injections: 0  HGB: 10.2, injection not given  Post Care: Patient declined observation  Discharge: Condition: Good, Destination: Home . AVS Provided  Performed by:  Wyvonne Lenz, RN

## 2022-07-15 ENCOUNTER — Encounter (HOSPITAL_COMMUNITY)
Admission: RE | Admit: 2022-07-15 | Discharge: 2022-07-15 | Disposition: A | Discharge status: Home / Self Care | Payer: Medicare Other | Source: Ambulatory Visit | Attending: Nephrology | Admitting: Nephrology

## 2022-07-15 VITALS — BP 157/64 | HR 51 | Temp 97.7°F | Resp 18

## 2022-07-15 DIAGNOSIS — N184 Chronic kidney disease, stage 4 (severe): Secondary | ICD-10-CM | POA: Diagnosis not present

## 2022-07-15 DIAGNOSIS — D631 Anemia in chronic kidney disease: Secondary | ICD-10-CM | POA: Diagnosis not present

## 2022-07-15 LAB — POCT HEMOGLOBIN-HEMACUE: Hemoglobin: 9.9 g/dL — ABNORMAL LOW (ref 12.0–15.0)

## 2022-07-15 MED ORDER — EPOETIN ALFA-EPBX 4000 UNIT/ML IJ SOLN
3000.0000 [IU] | Freq: Once | INTRAMUSCULAR | Status: AC
  Administered 2022-07-15: 3000 [IU] via SUBCUTANEOUS

## 2022-07-15 NOTE — Progress Notes (Signed)
Diagnosis: Anemia in Chronic Kidney Disease  Provider:  Manpreet Bhutani MD  Procedure: Injection  Retacrit (epoetin alfa-epbx), Dose: 3000 Units, Site: subcutaneous, Number of injections: 1  Hgb 9.9  Post Care: Patient declined observation  Discharge: Condition: Good, Destination: Home . AVS Provided  Performed by:  Wyvonne Lenz, RN

## 2022-07-15 NOTE — Addendum Note (Signed)
Encounter addended by: San Lohmeyer E, RN on: 07/15/2022 3:21 PM  Actions taken: Therapy plan modified

## 2022-07-25 ENCOUNTER — Encounter: Payer: Self-pay | Admitting: Family Medicine

## 2022-07-25 ENCOUNTER — Ambulatory Visit (INDEPENDENT_AMBULATORY_CARE_PROVIDER_SITE_OTHER): Payer: Medicare Other | Admitting: Family Medicine

## 2022-07-25 VITALS — BP 119/59 | HR 53 | Temp 97.4°F | Ht 69.0 in | Wt 222.6 lb

## 2022-07-25 DIAGNOSIS — I10 Essential (primary) hypertension: Secondary | ICD-10-CM

## 2022-07-25 DIAGNOSIS — Z23 Encounter for immunization: Secondary | ICD-10-CM

## 2022-07-25 DIAGNOSIS — N184 Chronic kidney disease, stage 4 (severe): Secondary | ICD-10-CM | POA: Diagnosis not present

## 2022-07-25 DIAGNOSIS — Z0001 Encounter for general adult medical examination with abnormal findings: Secondary | ICD-10-CM

## 2022-07-25 DIAGNOSIS — Z0000 Encounter for general adult medical examination without abnormal findings: Secondary | ICD-10-CM

## 2022-07-25 DIAGNOSIS — I509 Heart failure, unspecified: Secondary | ICD-10-CM | POA: Diagnosis not present

## 2022-07-25 DIAGNOSIS — K219 Gastro-esophageal reflux disease without esophagitis: Secondary | ICD-10-CM | POA: Diagnosis not present

## 2022-07-25 DIAGNOSIS — E6609 Other obesity due to excess calories: Secondary | ICD-10-CM

## 2022-07-25 DIAGNOSIS — I130 Hypertensive heart and chronic kidney disease with heart failure and stage 1 through stage 4 chronic kidney disease, or unspecified chronic kidney disease: Secondary | ICD-10-CM

## 2022-07-25 DIAGNOSIS — I4821 Permanent atrial fibrillation: Secondary | ICD-10-CM

## 2022-07-25 DIAGNOSIS — R7309 Other abnormal glucose: Secondary | ICD-10-CM | POA: Diagnosis not present

## 2022-07-25 DIAGNOSIS — Z6832 Body mass index (BMI) 32.0-32.9, adult: Secondary | ICD-10-CM

## 2022-07-25 LAB — CMP14+EGFR
AST: 14 IU/L (ref 0–40)
Albumin/Globulin Ratio: 1.4 (ref 1.2–2.2)
Albumin: 3.8 g/dL — ABNORMAL LOW (ref 3.9–4.9)
Alkaline Phosphatase: 134 IU/L — ABNORMAL HIGH (ref 44–121)
CO2: 19 mmol/L — ABNORMAL LOW (ref 20–29)
Globulin, Total: 2.7 g/dL (ref 1.5–4.5)
Sodium: 142 mmol/L (ref 134–144)
Total Protein: 6.5 g/dL (ref 6.0–8.5)

## 2022-07-25 LAB — CBC WITH DIFFERENTIAL/PLATELET
EOS (ABSOLUTE): 0.1 10*3/uL (ref 0.0–0.4)
Eos: 3 %
MCHC: 32.5 g/dL (ref 31.5–35.7)
Monocytes: 8 %
Platelets: 258 10*3/uL (ref 150–450)
RDW: 12.5 % (ref 11.7–15.4)
WBC: 4.8 10*3/uL (ref 3.4–10.8)

## 2022-07-25 LAB — LIPID PANEL
HDL: 34 mg/dL — ABNORMAL LOW (ref >39)
Triglycerides: 128 mg/dL (ref 0–149)

## 2022-07-25 LAB — BAYER DCA HB A1C WAIVED: HB A1C (BAYER DCA - WAIVED): 5.7 % — ABNORMAL HIGH (ref 4.8–5.6)

## 2022-07-25 MED ORDER — AMLODIPINE BESYLATE 5 MG PO TABS
5.0000 mg | ORAL_TABLET | Freq: Every day | ORAL | 3 refills | Status: AC
Start: 2022-07-25 — End: 2023-07-25

## 2022-07-25 NOTE — Progress Notes (Signed)
Complete physical exam  Patient: Lori Ortiz   DOB: 1955-07-14   67 y.o. Female  MRN: [id number]  Subjective:    Chief Complaint  Patient presents with   Annual Exam    Lori Ortiz is a 68 y.o. female who presents today for a complete physical exam. She reports consuming a general diet. The patient does not participate in regular exercise at present. She generally feels fairly well. She reports sleeping well. She does not have additional problems to discuss today.   She continues to follow up with nephrology every 3 months. She will be having a fistula placed soon for dialysis.   She also recently saw cardiology for a follow up. Weight has been stable. Denies edema, orthopnea, chest pain, dyspnea.   Reports GERD is fairly well controlled. She sometimes has to take an OTC pepcid with her Protonix. Denies nausea, vomiting, dysphagia.   Most recent fall risk assessment:    07/25/2022   10:14 AM  Fall Risk   Falls in the past year? 0  Number falls in past yr: 0  Injury with Fall? 0  Risk for fall due to : No Fall Risks  Follow up Falls evaluation completed     Most recent depression screenings:    07/25/2022   10:14 AM 04/23/2022    1:09 PM  PHQ 2/9 Scores  PHQ - 2 Score 0 0  PHQ- 9 Score 0 0    Vision:Not within last year  and Dental: No current dental problems and Receives regular dental care  Past Medical History:  Diagnosis Date   Cardiac arrhythmia due to congenital heart disease    GERD (gastroesophageal reflux disease)    Hypertension    Sleep apnea    Stroke [facility])       Patient Care Team: Lori Earing, FNP as PCP - General (Family Medicine)   Outpatient Medications Prior to Visit  Medication Sig   aspirin EC 81 MG tablet Take 81 mg by mouth daily. Swallow whole.   atorvastatin (LIPITOR) 40 MG tablet TAKE 1 TABLET BY MOUTH EVERY DAY   calcitRIOL (ROCALTROL) 0.25 MCG capsule Take 0.25 mcg by mouth 3 (three) times a week.   Cholecalciferol (VITAMIN D)  50 MCG (2000 UT) tablet Take 2,000 Units by mouth daily.   epoetin alfa (EPOGEN) 3000 UNIT/ML injection Inject into the skin.   ferrous sulfate 325 (65 FE) MG EC tablet TAKE 1 TABLET BY MOUTH EVERY DAY   hydrALAZINE (APRESOLINE) 25 MG tablet Take 1 tablet (25 mg total) by mouth 3 (three) times daily. (Patient taking differently: Take 100 mg by mouth 3 (three) times daily.)   pantoprazole (PROTONIX) 40 MG tablet TAKE 1 TABLET BY MOUTH EVERY DAY   telmisartan (MICARDIS) 20 MG tablet Take 10 mg by mouth daily.   torsemide (DEMADEX) 20 MG tablet Take 2 tablets (40 mg total) by mouth daily.   amLODipine (NORVASC) 5 MG tablet Take 1 tablet (5 mg total) by mouth daily.   isosorbide dinitrate (ISORDIL) 10 MG tablet Take 2 tablets (20 mg total) by mouth 3 (three) times daily.   No facility-administered medications prior to visit.    ROS Negative unless specially indicated above in HPI.       Objective:     BP (!) 119/59   Pulse (!) 53   Temp (!) 97.4 F (36.3 C) (Temporal)   Ht 5\' 9"  (1.753 m)   Wt 222 lb 9.6 oz (101 kg)   SpO2 96%  BMI 32.87 kg/m    Physical Exam Vitals and nursing note reviewed.  Constitutional:      General: She is not in acute distress.    Appearance: Normal appearance. She is not ill-appearing.  HENT:     Head: Normocephalic.     Right Ear: Tympanic membrane, ear canal and external ear normal.     Left Ear: Tympanic membrane, ear canal and external ear normal.     Nose: Nose normal.     Mouth/Throat:     Mouth: Mucous membranes are moist.     Pharynx: Oropharynx is clear.  Eyes:     Extraocular Movements: Extraocular movements intact.     Conjunctiva/sclera: Conjunctivae normal.     Pupils: Pupils are equal, round, and reactive to light.  Neck:     Thyroid: No thyroid mass, thyromegaly or thyroid tenderness.  Cardiovascular:     Rate and Rhythm: Normal rate and regular rhythm.     Pulses: Normal pulses.     Heart sounds: Normal heart sounds. No  murmur heard.    No friction rub. No gallop.  Pulmonary:     Effort: Pulmonary effort is normal.     Breath sounds: Normal breath sounds.  Abdominal:     General: Bowel sounds are normal. There is no distension.     Palpations: Abdomen is soft. There is no mass.     Tenderness: There is no abdominal tenderness. There is no guarding.  Musculoskeletal:     Cervical back: Normal range of motion and neck supple. No tenderness.     Right lower leg: No edema.     Left lower leg: No edema.  Skin:    General: Skin is warm and dry.     Capillary Refill: Capillary refill takes less than 2 seconds.     Findings: No lesion or rash.  Neurological:     General: No focal deficit present.     Mental Status: She is alert and oriented to person, place, and time.     Cranial Nerves: No cranial nerve deficit.     Motor: No weakness.     Coordination: Coordination normal.     Gait: Gait normal.  Psychiatric:        Mood and Affect: Mood normal.        Behavior: Behavior normal.        Thought Content: Thought content normal.        Judgment: Judgment normal.      No results found for any visits on 07/25/22.     Assessment & Plan:    Routine Health Maintenance and Physical Exam  Lori Ortiz was seen today for annual exam.  Diagnoses and all orders for this visit:  Routine general medical examination at a health care facility  Primary hypertension Well controlled on current regimen. Labs pending.  -     CBC with Differential/Platelet -     CMP14+EGFR -     Lipid panel -     amLODipine (NORVASC) 5 MG tablet; Take 1 tablet (5 mg total) by mouth daily. -     TSH  Permanent atrial fibrillation (HCC) Chronic heart failure, unspecified heart failure type [facility]) Managed by cardiology. Euvolemic on exam. Asymptomatic.   Gastroesophageal reflux disease without esophagitis Well controlled on current regimen.   Stage 4 chronic kidney disease (HCC) Managed by nephrology. Will be having fistula  placed next month.   Class 1 obesity due to excess calories with serious comorbidity and body mass  index (BMI) of 32.0 to 32.9 in adult Diet and exercise.  -     Bayer DCA Hb A1c Waived  Need for vaccination -     Zoster Recombinant (Shingrix ) -     Tdap vaccine greater than or equal to 7yo IM    Immunization History  Administered Date(s) Administered   Influenza, High Dose Seasonal PF 01/07/2021   Influenza,inj,Quad PF,6+ Mos 12/19/2013, 12/19/2016, 10/10/2018   Moderna SARS-COV2 Booster Vaccination 02/05/2020, 10/15/2020, 01/08/2021   Moderna Sars-Covid-2 Vaccination 07/05/2019, 08/02/2019   PNEUMOCOCCAL CONJUGATE-20 01/22/2022   Tdap 07/11/2010    Health Maintenance  Topic Date Due   Medicare Annual Wellness (AWV)  Never done   COVID-19 Vaccine (6 - 2023-24 season) 02/08/2023 (Originally 10/25/2021)   Zoster Vaccines- Shingrix (2 of 2) 09/19/2022   INFLUENZA VACCINE  09/25/2022   MAMMOGRAM  09/17/2023   Colonoscopy  11/12/2031   DTaP/Tdap/Td (3 - Td or Tdap) 07/24/2032   Pneumonia Vaccine 46+ Years old  Completed   DEXA SCAN  Completed   Hepatitis C Screening  Completed   HPV VACCINES  Aged Out    Discussed health benefits of physical activity, and encouraged her to engage in regular exercise appropriate for her age and condition.  Problem List Items Addressed This Visit       Cardiovascular and Mediastinum   Hypertension   Relevant Medications   amLODipine (NORVASC) 5 MG tablet   Other Relevant Orders   CBC with Differential/Platelet   CMP14+EGFR   Lipid panel   TSH   Permanent atrial fibrillation (HCC)   Relevant Medications   amLODipine (NORVASC) 5 MG tablet     Digestive   GERD (gastroesophageal reflux disease)   Other Visit Diagnoses     Routine general medical examination at a health care facility    -  Primary   Stage 4 chronic kidney disease (HCC)       Class 1 obesity due to excess calories with serious comorbidity and body mass index (BMI) of  32.0 to 32.9 in adult       Relevant Orders   Bayer DCA Hb A1c Waived   Chronic heart failure, unspecified heart failure type (HCC)       Relevant Medications   amLODipine (NORVASC) 5 MG tablet   Need for vaccination       Relevant Orders   Zoster Recombinant (Shingrix ) (Completed)   Tdap vaccine greater than or equal to 7yo IM (Completed)      Return in about 6 months (around 01/24/2023) for chronic follow up.   The patient indicates understanding of these issues and agrees with the plan.  Lori Earing, FNP

## 2022-07-25 NOTE — Patient Instructions (Signed)

## 2022-07-26 LAB — CMP14+EGFR
ALT: 11 IU/L (ref 0–32)
BUN/Creatinine Ratio: 12 (ref 12–28)
BUN: 37 mg/dL — ABNORMAL HIGH (ref 8–27)
Bilirubin Total: 0.4 mg/dL (ref 0.0–1.2)
Calcium: 9.9 mg/dL (ref 8.7–10.3)
Chloride: 107 mmol/L — ABNORMAL HIGH (ref 96–106)
Creatinine, Ser: 3.12 mg/dL (ref 0.57–1.00)
Glucose: 104 mg/dL — ABNORMAL HIGH (ref 70–99)
Potassium: 4.6 mmol/L (ref 3.5–5.2)
eGFR: 16 mL/min/{1.73_m2} — ABNORMAL LOW (ref >59)

## 2022-07-26 LAB — LIPID PANEL
Chol/HDL Ratio: 3.4 ratio (ref 0.0–4.4)
Cholesterol, Total: 115 mg/dL (ref 100–199)
LDL Chol Calc (NIH): 58 mg/dL (ref 0–99)
VLDL Cholesterol Cal: 23 mg/dL (ref 5–40)

## 2022-07-26 LAB — CBC WITH DIFFERENTIAL/PLATELET
Basophils Absolute: 0.1 10*3/uL (ref 0.0–0.2)
Basos: 1 %
Hematocrit: 34.8 % (ref 34.0–46.6)
Hemoglobin: 11.3 g/dL (ref 11.1–15.9)
Immature Grans (Abs): 0 10*3/uL (ref 0.0–0.1)
Immature Granulocytes: 0 %
Lymphocytes Absolute: 0.9 10*3/uL (ref 0.7–3.1)
Lymphs: 19 %
MCH: 30.4 pg (ref 26.6–33.0)
MCV: 94 fL (ref 79–97)
Monocytes Absolute: 0.4 10*3/uL (ref 0.1–0.9)
Neutrophils Absolute: 3.3 10*3/uL (ref 1.4–7.0)
Neutrophils: 69 %
RBC: 3.72 x10E6/uL — ABNORMAL LOW (ref 3.77–5.28)

## 2022-07-26 LAB — TSH: TSH: 1.42 u[IU]/mL (ref 0.450–4.500)

## 2022-07-28 DIAGNOSIS — G4733 Obstructive sleep apnea (adult) (pediatric): Secondary | ICD-10-CM | POA: Diagnosis not present

## 2022-07-29 ENCOUNTER — Encounter (HOSPITAL_COMMUNITY): Admission: RE | Admit: 2022-07-29 | Payer: Medicare Other | Source: Ambulatory Visit

## 2022-08-12 ENCOUNTER — Encounter (HOSPITAL_COMMUNITY)
Admission: RE | Admit: 2022-08-12 | Discharge: 2022-08-12 | Disposition: A | Discharge status: Home / Self Care | Payer: Medicare Other | Source: Ambulatory Visit | Attending: Nephrology | Admitting: Nephrology

## 2022-08-12 VITALS — BP 189/62 | HR 58 | Temp 97.8°F | Resp 20

## 2022-08-12 DIAGNOSIS — N184 Chronic kidney disease, stage 4 (severe): Secondary | ICD-10-CM | POA: Diagnosis not present

## 2022-08-12 DIAGNOSIS — D631 Anemia in chronic kidney disease: Secondary | ICD-10-CM | POA: Insufficient documentation

## 2022-08-12 LAB — POCT HEMOGLOBIN-HEMACUE: Hemoglobin: 10.5 g/dL — ABNORMAL LOW (ref 12.0–15.0)

## 2022-08-12 MED ORDER — EPOETIN ALFA-EPBX 3000 UNIT/ML IJ SOLN: 3000.0000 [IU] | Freq: Once | INTRAMUSCULAR | Status: DC

## 2022-08-12 NOTE — Addendum Note (Signed)
Encounter addended by: Wyvonne Lenz, RN on: 08/12/2022 11:42 AM  Actions taken: Therapy plan modified

## 2022-08-12 NOTE — Progress Notes (Signed)
Diagnosis: Anemia in Chronic Kidney Disease  Provider:  Celso Amy MD  Procedure: NO INJECTION NEEDED  HGB  10.5   Post Care:   Discharge: Condition: Good, Destination: Home . AVS Provided  Performed by:  Marlow Baars [place], RN

## 2022-08-13 DIAGNOSIS — G4733 Obstructive sleep apnea (adult) (pediatric): Secondary | ICD-10-CM | POA: Diagnosis not present

## 2022-08-15 ENCOUNTER — Encounter (HOSPITAL_COMMUNITY): Payer: Self-pay

## 2022-08-15 ENCOUNTER — Encounter (HOSPITAL_COMMUNITY)
Admission: RE | Admit: 2022-08-15 | Discharge: 2022-08-15 | Disposition: A | Discharge status: Home / Self Care | Payer: Medicare Other | Source: Ambulatory Visit | Attending: Vascular Surgery | Admitting: Vascular Surgery

## 2022-08-15 DIAGNOSIS — N184 Chronic kidney disease, stage 4 (severe): Secondary | ICD-10-CM

## 2022-08-15 DIAGNOSIS — D508 Other iron deficiency anemias: Secondary | ICD-10-CM

## 2022-08-15 HISTORY — DX: Chronic kidney disease, unspecified: N18.9

## 2022-08-19 ENCOUNTER — Encounter (HOSPITAL_COMMUNITY): Payer: Self-pay | Admitting: Vascular Surgery

## 2022-08-19 ENCOUNTER — Ambulatory Visit (HOSPITAL_COMMUNITY): Payer: Medicare Other | Admitting: Anesthesiology

## 2022-08-19 ENCOUNTER — Encounter (HOSPITAL_COMMUNITY)
Admission: RE | Disposition: A | Discharge status: Home / Self Care | Payer: Self-pay | Source: Home / Self Care | Attending: Vascular Surgery

## 2022-08-19 ENCOUNTER — Ambulatory Visit (HOSPITAL_BASED_OUTPATIENT_CLINIC_OR_DEPARTMENT_OTHER): Payer: Medicare Other | Admitting: Anesthesiology

## 2022-08-19 ENCOUNTER — Ambulatory Visit (HOSPITAL_COMMUNITY)
Admission: RE | Admit: 2022-08-19 | Discharge: 2022-08-19 | Disposition: A | Discharge status: Home / Self Care | Payer: Medicare Other | Attending: Vascular Surgery | Admitting: Vascular Surgery

## 2022-08-19 ENCOUNTER — Other Ambulatory Visit: Payer: Self-pay

## 2022-08-19 DIAGNOSIS — I350 Nonrheumatic aortic (valve) stenosis: Secondary | ICD-10-CM | POA: Diagnosis not present

## 2022-08-19 DIAGNOSIS — N184 Chronic kidney disease, stage 4 (severe): Secondary | ICD-10-CM | POA: Diagnosis not present

## 2022-08-19 DIAGNOSIS — I129 Hypertensive chronic kidney disease with stage 1 through stage 4 chronic kidney disease, or unspecified chronic kidney disease: Secondary | ICD-10-CM

## 2022-08-19 DIAGNOSIS — G4730 Sleep apnea, unspecified: Secondary | ICD-10-CM | POA: Insufficient documentation

## 2022-08-19 DIAGNOSIS — I272 Pulmonary hypertension, unspecified: Secondary | ICD-10-CM | POA: Diagnosis not present

## 2022-08-19 DIAGNOSIS — F17210 Nicotine dependence, cigarettes, uncomplicated: Secondary | ICD-10-CM

## 2022-08-19 DIAGNOSIS — I4891 Unspecified atrial fibrillation: Secondary | ICD-10-CM | POA: Insufficient documentation

## 2022-08-19 DIAGNOSIS — N185 Chronic kidney disease, stage 5: Secondary | ICD-10-CM | POA: Diagnosis not present

## 2022-08-19 DIAGNOSIS — Z8673 Personal history of transient ischemic attack (TIA), and cerebral infarction without residual deficits: Secondary | ICD-10-CM | POA: Diagnosis not present

## 2022-08-19 DIAGNOSIS — D631 Anemia in chronic kidney disease: Secondary | ICD-10-CM | POA: Diagnosis not present

## 2022-08-19 DIAGNOSIS — D508 Other iron deficiency anemias: Secondary | ICD-10-CM

## 2022-08-19 HISTORY — PX: AV FISTULA PLACEMENT: SHX1204

## 2022-08-19 LAB — HEMOGLOBIN AND HEMATOCRIT, BLOOD
HCT: 29.7 % — ABNORMAL LOW (ref 36.0–46.0)
Hemoglobin: 9.4 g/dL — ABNORMAL LOW (ref 12.0–15.0)

## 2022-08-19 LAB — BASIC METABOLIC PANEL
Anion gap: 9 (ref 5–15)
BUN: 49 mg/dL — ABNORMAL HIGH (ref 8–23)
CO2: 23 mmol/L (ref 22–32)
Calcium: 8.9 mg/dL (ref 8.9–10.3)
Chloride: 109 mmol/L (ref 98–111)
Creatinine, Ser: 3.32 mg/dL — ABNORMAL HIGH (ref 0.44–1.00)
GFR, Estimated: 15 mL/min — ABNORMAL LOW (ref >60)
Glucose, Bld: 100 mg/dL — ABNORMAL HIGH (ref 70–99)
Potassium: 4.3 mmol/L (ref 3.5–5.1)
Sodium: 141 mmol/L (ref 135–145)

## 2022-08-19 SURGERY — ARTERIOVENOUS (AV) FISTULA CREATION
Anesthesia: General | Site: Arm Upper | Laterality: Left

## 2022-08-19 MED ORDER — FENTANYL CITRATE PF 50 MCG/ML IJ SOSY: 25.0000 ug | PREFILLED_SYRINGE | INTRAMUSCULAR | Status: DC | PRN

## 2022-08-19 MED ORDER — OXYCODONE-ACETAMINOPHEN 5-325 MG PO TABS: 1.0000 | ORAL_TABLET | Freq: Four times a day (QID) | ORAL | 0 refills | Status: DC | PRN

## 2022-08-19 MED ORDER — FENTANYL CITRATE (PF) 100 MCG/2ML IJ SOLN
INTRAMUSCULAR | Status: AC
  Filled 2022-08-19: qty 2

## 2022-08-19 MED ORDER — PROPOFOL 500 MG/50ML IV EMUL
INTRAVENOUS | Status: DC | PRN
  Administered 2022-08-19: 75 ug/kg/min via INTRAVENOUS

## 2022-08-19 MED ORDER — CHLORHEXIDINE GLUCONATE 4 % EX LIQD: 60.0000 mL | Freq: Once | CUTANEOUS | Status: DC

## 2022-08-19 MED ORDER — CEFAZOLIN SODIUM-DEXTROSE 2-4 GM/100ML-% IV SOLN
INTRAVENOUS | Status: AC
  Filled 2022-08-19: qty 100

## 2022-08-19 MED ORDER — HEPARIN 6000 UNIT IRRIGATION SOLUTION
Status: DC | PRN
  Administered 2022-08-19: 1

## 2022-08-19 MED ORDER — LIDOCAINE-EPINEPHRINE 0.5 %-1:200000 IJ SOLN
INTRAMUSCULAR | Status: DC | PRN
  Administered 2022-08-19: 10 mL

## 2022-08-19 MED ORDER — 0.9 % SODIUM CHLORIDE (POUR BTL) OPTIME
TOPICAL | Status: DC | PRN
  Administered 2022-08-19: 1000 mL

## 2022-08-19 MED ORDER — CEFAZOLIN SODIUM-DEXTROSE 2-4 GM/100ML-% IV SOLN
2.0000 g | INTRAVENOUS | Status: AC
  Administered 2022-08-19: 2 g via INTRAVENOUS

## 2022-08-19 MED ORDER — PROPOFOL 500 MG/50ML IV EMUL
INTRAVENOUS | Status: AC
  Filled 2022-08-19: qty 50

## 2022-08-19 MED ORDER — LIDOCAINE-EPINEPHRINE 0.5 %-1:200000 IJ SOLN
INTRAMUSCULAR | Status: AC
  Filled 2022-08-19: qty 50

## 2022-08-19 MED ORDER — MIDAZOLAM HCL 5 MG/5ML IJ SOLN
INTRAMUSCULAR | Status: DC | PRN
  Administered 2022-08-19: .5 mg via INTRAVENOUS

## 2022-08-19 MED ORDER — MIDAZOLAM HCL 2 MG/2ML IJ SOLN
INTRAMUSCULAR | Status: AC
  Filled 2022-08-19: qty 2

## 2022-08-19 MED ORDER — SODIUM CHLORIDE 0.9 % IV SOLN: INTRAVENOUS | Status: DC

## 2022-08-19 MED ORDER — OXYCODONE HCL 5 MG PO TABS
5.0000 mg | ORAL_TABLET | Freq: Once | ORAL | Status: AC | PRN
  Administered 2022-08-19: 5 mg via ORAL
  Filled 2022-08-19: qty 1

## 2022-08-19 MED ORDER — CHLORHEXIDINE GLUCONATE 4 % EX LIQD
60.0000 mL | Freq: Once | CUTANEOUS | Status: DC
  Filled 2022-08-19: qty 60

## 2022-08-19 MED ORDER — ONDANSETRON HCL 4 MG/2ML IJ SOLN: 4.0000 mg | Freq: Once | INTRAMUSCULAR | Status: DC | PRN

## 2022-08-19 MED ORDER — HEPARIN SODIUM (PORCINE) 1000 UNIT/ML IJ SOLN
INTRAMUSCULAR | Status: AC
  Filled 2022-08-19: qty 6

## 2022-08-19 MED ORDER — OXYCODONE HCL 5 MG/5ML PO SOLN: 5.0000 mg | Freq: Once | ORAL | Status: AC | PRN

## 2022-08-19 MED ORDER — FENTANYL CITRATE (PF) 100 MCG/2ML IJ SOLN
INTRAMUSCULAR | Status: DC | PRN
  Administered 2022-08-19: 50 ug via INTRAVENOUS

## 2022-08-19 SURGICAL SUPPLY — 38 items
ADH SKN CLS APL DERMABOND .7 (GAUZE/BANDAGES/DRESSINGS) ×1
ARMBAND PINK RESTRICT EXTREMIT (MISCELLANEOUS) ×1 IMPLANT
BAG HAMPER (MISCELLANEOUS) ×1 IMPLANT
CANNULA VESSEL 3MM 2 BLNT TIP (CANNULA) ×1 IMPLANT
CLIP LIGATING EXTRA MED SLVR (CLIP) ×1 IMPLANT
CLIP LIGATING EXTRA SM BLUE (MISCELLANEOUS) ×1 IMPLANT
COVER LIGHT HANDLE STERIS (MISCELLANEOUS) ×2 IMPLANT
COVER MAYO STAND XLG (MISCELLANEOUS) ×1 IMPLANT
COVER PROBE U/S 5X48 (MISCELLANEOUS) IMPLANT
DECANTER SPIKE VIAL GLASS SM (MISCELLANEOUS) ×1 IMPLANT
DERMABOND ADVANCED .7 DNX12 (GAUZE/BANDAGES/DRESSINGS) ×1 IMPLANT
ELECT REM PT RETURN 9FT ADLT (ELECTROSURGICAL) ×1
ELECTRODE REM PT RTRN 9FT ADLT (ELECTROSURGICAL) ×1 IMPLANT
GAUZE SPONGE 4X4 12PLY STRL (GAUZE/BANDAGES/DRESSINGS) ×1 IMPLANT
GLOVE BIOGEL PI IND STRL 7.0 (GLOVE) ×2 IMPLANT
GLOVE SURG MICRO LTX SZ7.5 (GLOVE) ×1 IMPLANT
GOWN STRL REUS W/TWL LRG LVL3 (GOWN DISPOSABLE) ×3 IMPLANT
IV NS 500ML (IV SOLUTION) ×1
IV NS 500ML BAXH (IV SOLUTION) ×1 IMPLANT
KIT BLADEGUARD II DBL (SET/KITS/TRAYS/PACK) ×1 IMPLANT
KIT TURNOVER KIT A (KITS) ×1 IMPLANT
MANIFOLD NEPTUNE II (INSTRUMENTS) ×1 IMPLANT
MARKER SKIN DUAL TIP RULER LAB (MISCELLANEOUS) ×2 IMPLANT
NDL HYPO 18GX1.5 BLUNT FILL (NEEDLE) ×1 IMPLANT
NEEDLE HYPO 18GX1.5 BLUNT FILL (NEEDLE) ×1 IMPLANT
NS IRRIG 1000ML POUR BTL (IV SOLUTION) ×1 IMPLANT
PACK CV ACCESS (CUSTOM PROCEDURE TRAY) ×1 IMPLANT
PAD ARMBOARD 7.5X6 YLW CONV (MISCELLANEOUS) ×1 IMPLANT
SET BASIN LINEN APH (SET/KITS/TRAYS/PACK) ×1 IMPLANT
SOL PREP POV-IOD 4OZ 10% (MISCELLANEOUS) ×1 IMPLANT
SOL PREP PROV IODINE SCRUB 4OZ (MISCELLANEOUS) ×1 IMPLANT
SUT PROLENE 6 0 CC (SUTURE) ×1 IMPLANT
SUT SILK 2 0 SH (SUTURE) IMPLANT
SUT VIC AB 3-0 SH 27 (SUTURE) ×1
SUT VIC AB 3-0 SH 27X BRD (SUTURE) ×1 IMPLANT
SYR 10ML LL (SYRINGE) ×1 IMPLANT
SYR 50ML LL SCALE MARK (SYRINGE) ×1 IMPLANT
UNDERPAD 30X36 HEAVY ABSORB (UNDERPADS AND DIAPERS) ×1 IMPLANT

## 2022-08-19 NOTE — Discharge Instructions (Signed)
[facility]  Discharge Instructions  AV Fistula or Graft Surgery for Dialysis Access  Please refer to the following instructions for your post-procedure care. Your surgeon or physician assistant will discuss any changes with you.  Activity  You may drive the day following your surgery, if you are comfortable and no longer taking prescription pain medication. Resume full activity as the soreness in your incision resolves.  Bathing/Showering  You may shower after you go home. Keep your incision dry for 48 hours. Do not soak in a bathtub, hot tub, or swim until the incision heals completely. You may not shower if you have a hemodialysis catheter.  Incision Care  Clean your incision with mild soap and water after 48 hours. Pat the area dry with a clean towel. You do not need a bandage unless otherwise instructed. Do not apply any ointments or creams to your incision. You may have skin glue on your incision. Do not peel it off. It will come off on its own in about one week. Your arm may swell a bit after surgery. To reduce swelling use pillows to elevate your arm so it is above your heart. Your doctor will tell you if you need to lightly wrap your arm with an ACE bandage.  Diet  Resume your normal diet. There are not special food restrictions following this procedure. In order to heal from your surgery, it is CRITICAL to get adequate nutrition. Your body requires vitamins, minerals, and protein. Vegetables are the best source of vitamins and minerals. Vegetables also provide the perfect balance of protein. Processed food has little nutritional value, so try to avoid this.  Medications  Resume taking all of your medications. If your incision is causing pain, you may take over-the counter pain relievers such as acetaminophen (Tylenol). If you were prescribed a stronger pain medication, please be aware these medications can cause nausea and constipation. Prevent  nausea by taking the medication with a snack or meal. Avoid constipation by drinking plenty of fluids and eating foods with high amount of fiber, such as fruits, vegetables, and grains.  Do not take Tylenol if you are taking prescription pain medications.  Follow up Your surgeon may want to see you in the office following your access surgery. If so, this will be arranged at the time of your surgery.  Please call us immediately for any of the following conditions:  Increased pain, redness, drainage (pus) from your incision site Fever of 101 degrees or higher Severe or worsening pain at your incision site Hand pain or numbness.  Reduce your risk of vascular disease:  Stop smoking. If you would like help, call [facility] at 1-800-QUIT-NOW ((740)773-4081) or [place] at 980-670-1232  Manage your cholesterol Maintain a desired weight Control your diabetes Keep your blood pressure down  Dialysis  It will take several weeks to several months for your new dialysis access to be ready for use. Your surgeon will determine when it is okay to use it. Your nephrologist will continue to direct your dialysis. You can continue to use your Permcath until your new access is ready for use.   08/19/2022 Lori Ortiz [id number] 1955/08/07  Surgeon(s): Stacey Maura, Kristen Loader, MD  Procedure(s): LEFT ARM ARTERIOVENOUS (AV) FISTULA CREATION   May stick graft immediately   May stick graft on designated area only:    Do not stick fistula for 12  weeks    If you have any questions, please call the office at [phone].

## 2022-08-19 NOTE — Op Note (Signed)
    OPERATIVE REPORT  DATE OF SURGERY: 08/19/2022  PATIENT: Lori Ortiz, 67 y.o. female MRN: [id number]  DOB: 04-09-1955  PRE-OPERATIVE DIAGNOSIS: CKD 4  POST-OPERATIVE DIAGNOSIS:  Same  PROCEDURE: Left first stage brachiobasilic fistula  SURGEON:  Gretta Began, M.D.  PHYSICIAN ASSISTANT: Rebeca Alert, RNFA  The assistant was needed for exposure and to expedite the case  ANESTHESIA: MAC  EBL: per anesthesia record  Total I/O In: 400 [I.V.:300; IV Piggyback:100] Out: 5 [Blood:5]  BLOOD ADMINISTERED: none  DRAINS: none  SPECIMEN: none  COUNTS CORRECT:  YES  PATIENT DISPOSITION:  PACU - hemodynamically stable  PROCEDURE DETAILS: The patient was taken everyplace supine position of the area of the left arm prepped draped you sterile fashion.  SonoSite ultrasound was used to visualize the the vein was very large caliber above the elbow.  The vein branched above the elbow and the larger of these 2 coursed medially.  Using local anesthesia, incision was made over the antecubital space and carried down to isolate the basilic vein and the brachial artery.  The artery was of large caliber.  There was minimal atherosclerotic change in the artery.  The vein was mobilized proximally and distally was ligated distally and divided.  The vein was gently dilated and was of very good caliber.  The vein was dissected further proximally and the large branch that ran more laterally was clipped with a medium clip.  The artery was occluded proximally and distally and was opened with an 11 blade and sent longitudinally with Potts scissors.  The vein was cut to the appropriate length and was spatulated and sewn end-to-side to the artery with a running 6-0 Prolene suture.  The clamps were removed and excellent thrill was noted in the vein.  The patient did maintain a radial pulse.  The wound was irrigated with saline.  Hemostasis was obtained with electrocautery.  The wound was closed with 3-0 Vicryl in the  subcutaneous and subcuticular tissue.  Sterile dressing was applied and the patient was transferred to the recovery room   Larina Earthly, M.D., [facility] 08/19/2022 9:04 AM  Note: Portions of this report may have been transcribed using voice recognition software.  Every effort has been made to ensure accuracy; however, inadvertent computerized transcription errors may still be present.

## 2022-08-19 NOTE — Anesthesia Preprocedure Evaluation (Signed)
Anesthesia Evaluation  Patient identified by MRN, date of birth, ID band Patient awake    Reviewed: Allergy & Precautions, H&P , NPO status , Patient's Chart, lab work & pertinent test results, reviewed documented beta blocker date and time   Airway Mallampati: II  TM Distance: >3 FB Neck ROM: full    Dental no notable dental hx.    Pulmonary neg pulmonary ROS, sleep apnea , Current Smoker and Patient abstained from smoking.   Pulmonary exam normal breath sounds clear to auscultation       Cardiovascular Exercise Tolerance: Good hypertension, pulmonary hypertension+ dysrhythmias Atrial Fibrillation + Valvular Problems/Murmurs AS  Rhythm:regular Rate:Normal     Neuro/Psych CVA negative neurological ROS  negative psych ROS   GI/Hepatic negative GI ROS, Neg liver ROS,GERD  ,,  Endo/Other  negative endocrine ROS    Renal/GU Renal diseasenegative Renal ROS  negative genitourinary   Musculoskeletal   Abdominal   Peds  Hematology negative hematology ROS (+) Blood dyscrasia, anemia   Anesthesia Other Findings Echo 06/26/21 [facility]): Summary    1. The left ventricle is normal in size with mildly increased wall  thickness.    2. The left ventricular systolic function is normal, LVEF is visually  estimated at > 55%.    3. There is mild mitral valve regurgitation.    4. There is mild aortic valve stenosis.    5. The left atrium is moderately dilated in size.    6. The right ventricle is normal in size, with normal systolic function.    7. There is mild to moderate tricuspid regurgitation.    8. There is moderate pulmonary hypertension.    9. The right atrium is mildly to moderately dilated  in size.    10. IVC size and inspiratory change suggest elevated right atrial pressure.  (10-20 mmHg).    Reproductive/Obstetrics negative OB ROS                             Anesthesia Physical Anesthesia  Plan  ASA: 3  Anesthesia Plan: General   Post-op Pain Management:    Induction:   PONV Risk Score and Plan: Ondansetron  Airway Management Planned:   Additional Equipment:   Intra-op Plan:   Post-operative Plan:   Informed Consent: I have reviewed the patients History and Physical, chart, labs and discussed the procedure including the risks, benefits and alternatives for the proposed anesthesia with the patient or authorized representative who has indicated his/her understanding and acceptance.     Dental Advisory Given  Plan Discussed with: CRNA  Anesthesia Plan Comments:        Anesthesia Quick Evaluation

## 2022-08-19 NOTE — H&P (Signed)
Larina Earthly, MD Physician Specialty: Vascular Surgery   Progress Notes    Signed   Encounter Date: 05/14/2022   Signed                                            [facility] [place]   Patient name: Lori Ortiz     MRN: [id number]        DOB: 1955/07/17            Sex: female   REASON FOR CONSULT: Discuss access for hemodialysis   HPI: Lori Ortiz is a 67 y.o. female, who is here today for discussion of access for hemodialysis.  She is a patient of Dr. Wolfgang Phoenix.  She has had progression to CKD 4.  Her most recent creatinine was 3.0.  She is right-handed.  She does not have a pacemaker.  She is not on anticoagulant.  She did recently have a Watchman procedure.  Her mother was on hemodialysis that she is somewhat familiar.       Past Medical History:  Diagnosis Date   Cardiac arrhythmia due to congenital heart disease     GERD (gastroesophageal reflux disease)     Hypertension     Sleep apnea     Stroke (HCC)             Family History  Problem Relation Age of Onset   Heart disease Mother     Diabetes Mother     Aneurysm Father     Hypertension Father     Cancer Sister     Hypertension Brother     Stomach cancer Neg Hx     Esophageal cancer Neg Hx     Colon cancer Neg Hx        SOCIAL HISTORY: Social History         Socioeconomic History   Marital status: Single      Spouse name: Not on file   Number of children: 0   Years of education: Not on file   Highest education level: Not on file  Occupational History   Occupation: retired  Tobacco Use   Smoking status: Every Day      Packs/day: .5      Types: Cigarettes   Smokeless tobacco: Never  Vaping Use   Vaping Use: Never used  Substance and Sexual Activity   Alcohol use: No   Drug use: No   Sexual activity: Not on file  Other Topics Concern   Not on file  Social History Narrative   Not on file    Social Determinants of Health    Financial Resource Strain: Not on file   Food Insecurity: Not on file  Transportation Needs: Not on file  Physical Activity: Not on file  Stress: Not on file  Social Connections: Not on file  Intimate Partner Violence: Not on file      No Known Allergies         Current Outpatient Medications  Medication Sig Dispense Refill   amLODipine (NORVASC) 5 MG tablet Take 1 tablet (5 mg total) by mouth daily. 30 tablet 11   aspirin EC 81 MG tablet Take 81 mg by mouth daily. Swallow whole.       atorvastatin (LIPITOR) 40 MG tablet TAKE 1 TABLET BY MOUTH EVERY DAY 90 tablet 0  Cholecalciferol (VITAMIN D) 50 MCG (2000 UT) tablet Take 2,000 Units by mouth daily.       epoetin alfa (EPOGEN) 3000 UNIT/ML injection Inject into the skin.       ferrous sulfate 325 (65 FE) MG EC tablet TAKE 1 TABLET BY MOUTH EVERY DAY 90 tablet 0   hydrALAZINE (APRESOLINE) 25 MG tablet Take 1 tablet (25 mg total) by mouth 3 (three) times daily. (Patient taking differently: Take 100 mg by mouth 3 (three) times daily.) 90 tablet 5   isosorbide dinitrate (ISORDIL) 10 MG tablet Take 2 tablets (20 mg total) by mouth 3 (three) times daily. 180 tablet 11   pantoprazole (PROTONIX) 40 MG tablet TAKE 1 TABLET BY MOUTH EVERY DAY 90 tablet 0   telmisartan (MICARDIS) 20 MG tablet Take 10 mg by mouth daily.       torsemide (DEMADEX) 20 MG tablet Take 2 tablets (40 mg total) by mouth daily. 180 tablet 1    No current facility-administered medications for this visit.      REVIEW OF SYSTEMS:  [X]  denotes positive finding, [ ]  denotes negative finding Cardiac   Comments:  Chest pain or chest pressure:      Shortness of breath upon exertion: x    Short of breath when lying flat:      Irregular heart rhythm: x           Vascular      Pain in calf, thigh, or hip brought on by ambulation:      Pain in feet at night that wakes you up from your sleep:       Blood clot in your veins:      Leg swelling:              Pulmonary      Oxygen at home:      Productive cough:        Wheezing:              Neurologic      Sudden weakness in arms or legs:       Sudden numbness in arms or legs:       Sudden onset of difficulty speaking or slurred speech:      Temporary loss of vision in one eye:       Problems with dizziness:              Gastrointestinal      Blood in stool:       Vomited blood:              Genitourinary      Burning when urinating:       Blood in urine:             Psychiatric      Major depression:              Hematologic      Bleeding problems:      Problems with blood clotting too easily:             Skin      Rashes or ulcers:             Constitutional      Fever or chills:          PHYSICAL EXAM:    Vitals:    05/14/22 0853  BP: (!) 169/75  Pulse: (!) 51  Temp: 97.9 F (36.6 C)  SpO2: 96%  Weight: 233 lb 6.4 oz (105.9  kg)  Height: 5\' 9"  (1.753 m)      GENERAL: The patient is a well-nourished female, in no acute distress. The vital signs are documented above. CARDIOVASCULAR: 2+ radial pulses bilaterally.  Small surface veins bilaterally. PULMONARY: There is good air exchange  MUSCULOSKELETAL: There are no major deformities or cyanosis. NEUROLOGIC: No focal weakness or paresthesias are detected. SKIN: There are no ulcers or rashes noted. PSYCHIATRIC: The patient has a normal affect.   DATA:  I imaged both arms with SonoSite ultrasound.  This shows a very small cephalic vein in the right arm.  Moderate to large basilic vein in the right arm.   On the left arm she has a small forearm cephalic vein.  The vein is better caliber at the antecubital space but is deep and small with multiple branches in the left upper arm.  She does have a good caliber btasilic vein on the left.  There is branching above the elbow with predominant branch close o her brachial artery.   MEDICAL ISSUES: Had long discussion with the patient regarding options for hemodialysis.  I discussed tunneled catheter for acute dialysis with the risk  of long-term use of a catheter.  Also discussed AV graft and AV fistula.  She does appear to be a good candidate for basilic vein fistula attempt in her left arm.  I explained that this would be a two-stage process with initially brachiobasilic fistula.  We would then see her back in the office in 4 to 6 weeks with a duplex to determine if she is having adequate maturation of her fistula.  We would then proceed with second stage transposition.  Wishes to schedule for stage I 05/27/2022 at [facility]     Larina Earthly, MD [facility] Tel [phone] Pager [phone]   Note: Portions of this report may have been transcribed using voice recognition software.  Every effort has been made to ensure accuracy; however, inadvertent computerized transcription errors may still be present.          Electronically signed by Larina Earthly, MD at 05/14/2022 10:20 AM    Addendum:  The patient has been re-examined and re-evaluated.  The patient's history and physical has been reviewed and is unchanged.    Lori Ortiz is a 67 y.o. female is being admitted with CKD IV. All the risks, benefits and other treatment options have been discussed with the patient. The patient has consented to proceed with Procedure(s): LEFT ARM ARTERIOVENOUS (AV) FISTULA CREATION as a surgical intervention.  Lori Ortiz 08/19/2022 7:20 AM Vascular [facility] Vein Surgery

## 2022-08-19 NOTE — Transfer of Care (Signed)
Immediate Anesthesia Transfer of Care Note  Patient: Lori Ortiz  Procedure(s) Performed: LEFT ARM ARTERIOVENOUS (AV) FISTULA CREATION (Left: Arm Upper)  Patient Location: PACU  Anesthesia Type:General  Level of Consciousness: awake  Airway & Oxygen Therapy: Patient Spontanous Breathing  Post-op Assessment: Post -op Vital signs reviewed and stable  Post vital signs: Reviewed and stable  Last Vitals:  Vitals Value Taken Time  BP 186/87 08/19/22 0855  Temp    Pulse 71 08/19/22 0857  Resp 19 08/19/22 0857  SpO2 99 % 08/19/22 0857  Vitals shown include unvalidated device data.  Last Pain:  Vitals:   08/19/22 0708  TempSrc: Oral  PainSc: 0-No pain         Complications: No notable events documented.

## 2022-08-21 ENCOUNTER — Encounter (HOSPITAL_COMMUNITY): Payer: Self-pay | Admitting: Vascular Surgery

## 2022-08-21 NOTE — Anesthesia Postprocedure Evaluation (Signed)
Anesthesia Post Note  Patient: Lori Ortiz  Procedure(s) Performed: LEFT ARM ARTERIOVENOUS (AV) FISTULA CREATION (Left: Arm Upper)  Patient location during evaluation: Phase II Anesthesia Type: General Level of consciousness: awake Pain management: pain level controlled Vital Signs Assessment: post-procedure vital signs reviewed and stable Respiratory status: spontaneous breathing and respiratory function stable Cardiovascular status: blood pressure returned to baseline and stable Postop Assessment: no headache and no apparent nausea or vomiting Anesthetic complications: no Comments: Late entry   No notable events documented.   Last Vitals:  Vitals:   08/19/22 0930 08/19/22 0942  BP: (!) 171/91 (!) 206/58  Pulse: 67 64  Resp: 16 16  Temp:  36.7 C  SpO2: 91% 96%    Last Pain:  Vitals:   08/20/22 1427  TempSrc:   PainSc: 1                  Windell Norfolk

## 2022-08-26 ENCOUNTER — Encounter (HOSPITAL_COMMUNITY)
Admission: RE | Admit: 2022-08-26 | Discharge: 2022-08-26 | Disposition: A | Discharge status: Home / Self Care | Payer: Medicare Other | Source: Ambulatory Visit | Attending: Nephrology | Admitting: Nephrology

## 2022-08-26 VITALS — BP 162/74 | HR 57 | Temp 97.4°F | Resp 18

## 2022-08-26 DIAGNOSIS — N184 Chronic kidney disease, stage 4 (severe): Secondary | ICD-10-CM | POA: Insufficient documentation

## 2022-08-26 DIAGNOSIS — D631 Anemia in chronic kidney disease: Secondary | ICD-10-CM | POA: Diagnosis not present

## 2022-08-26 LAB — RENAL FUNCTION PANEL
Albumin: 3.6 g/dL (ref 3.5–5.0)
Anion gap: 9 (ref 5–15)
BUN: 36 mg/dL — ABNORMAL HIGH (ref 8–23)
CO2: 22 mmol/L (ref 22–32)
Calcium: 9.4 mg/dL (ref 8.9–10.3)
Chloride: 107 mmol/L (ref 98–111)
Creatinine, Ser: 2.92 mg/dL — ABNORMAL HIGH (ref 0.44–1.00)
GFR, Estimated: 17 mL/min — ABNORMAL LOW (ref >60)
Glucose, Bld: 116 mg/dL — ABNORMAL HIGH (ref 70–99)
Phosphorus: 3.5 mg/dL (ref 2.5–4.6)
Potassium: 4.1 mmol/L (ref 3.5–5.1)
Sodium: 138 mmol/L (ref 135–145)

## 2022-08-26 LAB — CBC
HCT: 31.6 % — ABNORMAL LOW (ref 36.0–46.0)
Hemoglobin: 10.2 g/dL — ABNORMAL LOW (ref 12.0–15.0)
MCH: 30.8 pg (ref 26.0–34.0)
MCHC: 32.3 g/dL (ref 30.0–36.0)
MCV: 95.5 fL (ref 80.0–100.0)
Platelets: 224 10*3/uL (ref 150–400)
RBC: 3.31 MIL/uL — ABNORMAL LOW (ref 3.87–5.11)
RDW: 13.2 % (ref 11.5–15.5)
WBC: 5.5 10*3/uL (ref 4.0–10.5)
nRBC: 0 % (ref 0.0–0.2)

## 2022-08-26 LAB — PROTEIN / CREATININE RATIO, URINE
Creatinine, Urine: 69 mg/dL
Protein Creatinine Ratio: 1.57 mg/mg{Cre} — ABNORMAL HIGH (ref 0.00–0.15)
Total Protein, Urine: 108 mg/dL

## 2022-08-26 LAB — POCT HEMOGLOBIN-HEMACUE: Hemoglobin: 9.7 g/dL — ABNORMAL LOW (ref 12.0–15.0)

## 2022-08-26 MED ORDER — EPOETIN ALFA-EPBX 3000 UNIT/ML IJ SOLN
3000.0000 [IU] | Freq: Once | INTRAMUSCULAR | Status: AC
  Administered 2022-08-26: 3000 [IU] via SUBCUTANEOUS

## 2022-08-26 NOTE — Progress Notes (Signed)
Diagnosis: Anemia in Chronic Kidney Disease  Provider:  Manpreet Bhutani MD  Procedure: Injection  Retacrit (epoetin alfa-epbx), Dose: 3000 Units, Site: subcutaneous, Number of injections: 1  Hgb 9.7   Post Care: Patient declined observation  Discharge: Condition: Good, Destination: Home . AVS Provided  Performed by:  Arrie Senate, RN

## 2022-09-02 DIAGNOSIS — N184 Chronic kidney disease, stage 4 (severe): Secondary | ICD-10-CM | POA: Diagnosis not present

## 2022-09-02 DIAGNOSIS — I129 Hypertensive chronic kidney disease with stage 1 through stage 4 chronic kidney disease, or unspecified chronic kidney disease: Secondary | ICD-10-CM | POA: Diagnosis not present

## 2022-09-02 DIAGNOSIS — D638 Anemia in other chronic diseases classified elsewhere: Secondary | ICD-10-CM | POA: Diagnosis not present

## 2022-09-02 DIAGNOSIS — R808 Other proteinuria: Secondary | ICD-10-CM | POA: Diagnosis not present

## 2022-09-03 ENCOUNTER — Other Ambulatory Visit: Payer: Self-pay

## 2022-09-03 ENCOUNTER — Ambulatory Visit (INDEPENDENT_AMBULATORY_CARE_PROVIDER_SITE_OTHER): Payer: Medicare Other | Admitting: Internal Medicine

## 2022-09-03 ENCOUNTER — Encounter: Payer: Self-pay | Admitting: Internal Medicine

## 2022-09-03 VITALS — BP 150/64 | HR 58 | Ht 69.5 in | Wt 229.2 lb

## 2022-09-03 DIAGNOSIS — I350 Nonrheumatic aortic (valve) stenosis: Secondary | ICD-10-CM

## 2022-09-03 DIAGNOSIS — I10 Essential (primary) hypertension: Secondary | ICD-10-CM

## 2022-09-03 DIAGNOSIS — I480 Paroxysmal atrial fibrillation: Secondary | ICD-10-CM

## 2022-09-03 DIAGNOSIS — Z95818 Presence of other cardiac implants and grafts: Secondary | ICD-10-CM

## 2022-09-03 NOTE — Addendum Note (Signed)
Addended by: Herbert Deaner on: 09/03/2022 02:08 PM   Modules accepted: Level of Service

## 2022-09-03 NOTE — Progress Notes (Signed)
Patient was referred to cardiology clinic to obtain 2D echocardiogram due to the presence of mild aortic valve stenosis in 2023. But she is already scheduled for 2D echocardiogram on 09/11/2022. Will cancel today's cardiology clinic visit appointment.

## 2022-09-09 ENCOUNTER — Encounter (HOSPITAL_COMMUNITY)
Admission: RE | Admit: 2022-09-09 | Discharge: 2022-09-09 | Disposition: A | Discharge status: Home / Self Care | Payer: Medicare Other | Source: Ambulatory Visit | Attending: Nephrology | Admitting: Nephrology

## 2022-09-09 VITALS — BP 163/52 | HR 55 | Temp 97.7°F | Resp 18

## 2022-09-09 DIAGNOSIS — N184 Chronic kidney disease, stage 4 (severe): Secondary | ICD-10-CM | POA: Diagnosis not present

## 2022-09-09 DIAGNOSIS — D631 Anemia in chronic kidney disease: Secondary | ICD-10-CM | POA: Diagnosis not present

## 2022-09-09 LAB — POCT HEMOGLOBIN-HEMACUE: Hemoglobin: 10.4 g/dL — ABNORMAL LOW (ref 12.0–15.0)

## 2022-09-09 MED ORDER — EPOETIN ALFA-EPBX 3000 UNIT/ML IJ SOLN: 3000.0000 [IU] | Freq: Once | INTRAMUSCULAR | Status: DC

## 2022-09-09 NOTE — Progress Notes (Signed)
Diagnosis: Anemia in Chronic Kidney Disease  Provider:  Celso Amy MD  Procedure:  No injection needed Hgb. 10.4  Discharge: Condition: Good, Destination: Home . AVS Provided  Performed by:  Daleen Squibb, RN

## 2022-09-09 NOTE — Addendum Note (Signed)
Encounter addended by: Orson Aloe, [facility] on: 09/09/2022 11:10 AM  Actions taken: Actions taken from a BestPractice Advisory, Allergies reviewed, Order list changed

## 2022-09-09 NOTE — Addendum Note (Signed)
Encounter addended by: Marjo Bicker, RN on: 09/09/2022 2:25 PM  Actions taken: Therapy plan modified

## 2022-09-11 ENCOUNTER — Ambulatory Visit: Payer: Medicare Other | Attending: Vascular Surgery

## 2022-09-11 ENCOUNTER — Other Ambulatory Visit: Payer: Self-pay | Admitting: *Deleted

## 2022-09-11 DIAGNOSIS — I350 Nonrheumatic aortic (valve) stenosis: Secondary | ICD-10-CM | POA: Diagnosis not present

## 2022-09-11 DIAGNOSIS — N184 Chronic kidney disease, stage 4 (severe): Secondary | ICD-10-CM

## 2022-09-12 LAB — ECHOCARDIOGRAM COMPLETE
AR max vel: 1.78 cm2
AV Area VTI: 1.88 cm2
AV Area mean vel: 1.77 cm2
AV Mean grad: 21 mmHg
AV Peak grad: 26.8 mmHg
Ao pk vel: 2.59 m/s
Area-P 1/2: 3.97 cm2
Calc EF: 70.2 %
S' Lateral: 2.9 cm
Single Plane A2C EF: 66.7 %
Single Plane A4C EF: 74 %

## 2022-09-20 ENCOUNTER — Other Ambulatory Visit: Payer: Self-pay | Admitting: Family Medicine

## 2022-09-21 ENCOUNTER — Telehealth: Payer: Self-pay | Admitting: Pharmacy Technician

## 2022-09-21 NOTE — Telephone Encounter (Signed)
Auth Submission: NO AUTH NEEDED Site of care: Site of care: [facility] Payer: [employer] MEDICARE Medication & CPT/J Code(s) submitted:  RETACRIT [id number] Route of submission (phone, fax, portal):  PORTAL Phone # Fax # Auth type: Buy/Bill PB Units/visits requested: 3000 UNITS Q2WKS Reference number: [id number] Approval from: 08/26/22 to 02/24/23

## 2022-09-23 ENCOUNTER — Encounter (HOSPITAL_COMMUNITY)
Admission: RE | Admit: 2022-09-23 | Discharge: 2022-09-23 | Disposition: A | Discharge status: Home / Self Care | Payer: Medicare Other | Source: Ambulatory Visit | Attending: Nephrology | Admitting: Nephrology

## 2022-09-23 VITALS — BP 157/57 | HR 54 | Temp 97.5°F | Resp 18

## 2022-09-23 DIAGNOSIS — N184 Chronic kidney disease, stage 4 (severe): Secondary | ICD-10-CM | POA: Diagnosis not present

## 2022-09-23 DIAGNOSIS — D631 Anemia in chronic kidney disease: Secondary | ICD-10-CM

## 2022-09-23 LAB — POCT HEMOGLOBIN-HEMACUE: Hemoglobin: 10.3 g/dL — ABNORMAL LOW (ref 12.0–15.0)

## 2022-09-23 MED ORDER — EPOETIN ALFA-EPBX 3000 UNIT/ML IJ SOLN: 3000.0000 [IU] | Freq: Once | INTRAMUSCULAR | Status: DC

## 2022-09-23 NOTE — Progress Notes (Signed)
Hgb 10.3 - injection not needed

## 2022-09-24 ENCOUNTER — Other Ambulatory Visit: Payer: Self-pay

## 2022-09-24 ENCOUNTER — Other Ambulatory Visit: Payer: Self-pay | Admitting: Family Medicine

## 2022-09-24 DIAGNOSIS — K219 Gastro-esophageal reflux disease without esophagitis: Secondary | ICD-10-CM

## 2022-09-29 ENCOUNTER — Other Ambulatory Visit: Payer: Self-pay | Admitting: Nurse Practitioner

## 2022-09-29 DIAGNOSIS — I5023 Acute on chronic systolic (congestive) heart failure: Secondary | ICD-10-CM

## 2022-10-01 ENCOUNTER — Other Ambulatory Visit: Payer: Self-pay | Admitting: Nurse Practitioner

## 2022-10-01 DIAGNOSIS — I5023 Acute on chronic systolic (congestive) heart failure: Secondary | ICD-10-CM

## 2022-10-02 ENCOUNTER — Ambulatory Visit (INDEPENDENT_AMBULATORY_CARE_PROVIDER_SITE_OTHER): Payer: Medicare Other | Admitting: Physician Assistant

## 2022-10-02 ENCOUNTER — Ambulatory Visit (HOSPITAL_COMMUNITY)
Admission: RE | Admit: 2022-10-02 | Discharge: 2022-10-02 | Disposition: A | Discharge status: Home / Self Care | Payer: Medicare Other | Source: Ambulatory Visit | Attending: Vascular Surgery | Admitting: Vascular Surgery

## 2022-10-02 VITALS — BP 145/71 | HR 51 | Temp 98.0°F | Resp 18 | Ht 69.5 in | Wt 221.7 lb

## 2022-10-02 DIAGNOSIS — N184 Chronic kidney disease, stage 4 (severe): Secondary | ICD-10-CM

## 2022-10-02 NOTE — Progress Notes (Signed)
  POST OPERATIVE OFFICE NOTE    CC:  F/u for surgery  HPI:  This is a 67 y.o. female who is s/p first stage left arm basilic vein fistula creation by Dr. Arbie Cookey on 08/19/2022.  She denies any steal symptoms in her left hand.  She believes her incision is well-healed.  She is not yet on hemodialysis.  CKD is managed by Dr. Wolfgang Phoenix in [place].  No Known Allergies  Current Outpatient Medications  Medication Sig Dispense Refill   amLODipine (NORVASC) 5 MG tablet Take 1 tablet (5 mg total) by mouth daily. (Patient taking differently: Take 10 mg by mouth daily.) 90 tablet 3   aspirin EC 81 MG tablet Take 81 mg by mouth in the morning. Swallow whole.     atorvastatin (LIPITOR) 40 MG tablet TAKE 1 TABLET BY MOUTH EVERY DAY 90 tablet 0   calcitRIOL (ROCALTROL) 0.25 MCG capsule Take 0.25 mcg by mouth every Monday, Wednesday, and Friday at 8 PM. In the evening     Cholecalciferol (VITAMIN D) 50 MCG (2000 UT) tablet Take 2,000 Units by mouth in the morning.     epoetin alfa (EPOGEN) 3000 UNIT/ML injection Inject into the skin.     ferrous sulfate 325 (65 FE) MG EC tablet TAKE 1 TABLET BY MOUTH EVERY DAY 90 tablet 0   hydrALAZINE (APRESOLINE) 100 MG tablet Take 100 mg by mouth See admin instructions. Take 1 tablet (100 mg) by mouth 3 times a day on Mondays & Fridays ONLY     Naphazoline HCl (CLEAR EYES OP) Place 1 drop into both eyes 3 (three) times daily as needed (irritated eyes.).     pantoprazole (PROTONIX) 40 MG tablet TAKE 1 TABLET BY MOUTH EVERY DAY 90 tablet 0   telmisartan (MICARDIS) 20 MG tablet Take 10 mg by mouth every evening.     torsemide (DEMADEX) 20 MG tablet Take 2 tablets (40 mg total) by mouth daily. (Patient taking differently: Take 20 mg by mouth 2 (two) times daily.) 180 tablet 1   oxyCODONE-acetaminophen (PERCOCET) 5-325 MG tablet Take 1 tablet by mouth every 6 (six) hours as needed for severe pain. (Patient not taking: Reported on 10/02/2022) 8 tablet 0   No current  facility-administered medications for this visit.     ROS:  See HPI  Physical Exam:  Vitals:   10/02/22 1324  BP: (!) 145/71  Pulse: (!) 51  Resp: 18  Temp: 98 F (36.7 C)  TempSrc: Temporal  SpO2: 98%  Weight: 221 lb 11.2 oz (100.6 kg)  Height: 5' 9.5" (1.765 m)    Incision:  L arm incision c/d/i Extremities:  palpable L radial pulse; palpable thrill through basilic vein in distal upper arm; symmetrical grip strength Neuro: A&O  Assessment/Plan:  This is a 67 y.o. female who is s/p: Left arm first stage basilic vein transposition by Dr. Arbie Cookey  -Left hand well-perfused with palpable radial pulse and no signs or symptoms of steal syndrome -Based on duplex, basilic vein has matured and is ready for second stage -Plan will be left arm second stage basilic vein transposition by the next available surgeon.  Case was discussed in detail with the patient and all questions were answered.  She is agreeable to proceed.   Emilie Rutter, PA-C [facility] [phone]  Clinic MD:  Edilia Bo

## 2022-10-07 ENCOUNTER — Encounter (HOSPITAL_COMMUNITY): Payer: Medicare Other | Attending: Nephrology

## 2022-10-07 ENCOUNTER — Encounter (HOSPITAL_COMMUNITY): Payer: Medicare Other

## 2022-10-07 DIAGNOSIS — N184 Chronic kidney disease, stage 4 (severe): Secondary | ICD-10-CM | POA: Insufficient documentation

## 2022-10-07 DIAGNOSIS — D631 Anemia in chronic kidney disease: Secondary | ICD-10-CM | POA: Insufficient documentation

## 2022-10-07 NOTE — Progress Notes (Signed)
Diagnosis: Anemia in Chronic Kidney Disease  Provider:  Celso Amy MD  HGB  11.3     no injection needed    Discharge: Condition: Good, Destination: Home . AVS Declined  Performed by:  Marlow Baars [place], RN

## 2022-10-08 ENCOUNTER — Other Ambulatory Visit: Payer: Self-pay

## 2022-10-08 DIAGNOSIS — N184 Chronic kidney disease, stage 4 (severe): Secondary | ICD-10-CM

## 2022-10-11 ENCOUNTER — Other Ambulatory Visit: Payer: Self-pay | Admitting: Family Medicine

## 2022-10-11 DIAGNOSIS — E782 Mixed hyperlipidemia: Secondary | ICD-10-CM

## 2022-10-13 ENCOUNTER — Ambulatory Visit (INDEPENDENT_AMBULATORY_CARE_PROVIDER_SITE_OTHER): Payer: Medicare Other | Admitting: Family

## 2022-10-13 ENCOUNTER — Encounter: Payer: Self-pay | Admitting: Family

## 2022-10-13 VITALS — BP 131/77 | HR 60 | Temp 97.8°F | Ht 69.4 in | Wt 224.2 lb

## 2022-10-13 DIAGNOSIS — L989 Disorder of the skin and subcutaneous tissue, unspecified: Secondary | ICD-10-CM | POA: Diagnosis not present

## 2022-10-13 DIAGNOSIS — N184 Chronic kidney disease, stage 4 (severe): Secondary | ICD-10-CM

## 2022-10-13 DIAGNOSIS — L918 Other hypertrophic disorders of the skin: Secondary | ICD-10-CM

## 2022-10-13 DIAGNOSIS — R531 Weakness: Secondary | ICD-10-CM

## 2022-10-13 MED ORDER — MUPIROCIN 2 % EX OINT: 1.0000 | TOPICAL_OINTMENT | Freq: Two times a day (BID) | CUTANEOUS | 0 refills | Status: AC

## 2022-10-13 NOTE — Progress Notes (Signed)
   Subjective:    Patient ID: Lori Ortiz, female    DOB: 1955-07-16, 67 y.o.   MRN: [id number]  Chief Complaint  Patient presents with   Wound Infection    On bottom     HPI Pt presents to the office today with a wound on her buttocks. She reports it has been for months, however, over the last month it has become "hard". Reports aching pain of 9 out 10. Her sister looked at it and told her it was "infected".   She has CKD end stage and just had her fistula placed. She is complaining of generalized weakness.    Review of Systems  All other systems reviewed and are negative.      Objective:   Physical Exam Vitals reviewed.  Constitutional:      General: She is not in acute distress.    Appearance: She is well-developed.  HENT:     Head: Normocephalic and atraumatic.  Eyes:     Pupils: Pupils are equal, round, and reactive to light.  Neck:     Thyroid: No thyromegaly.  Cardiovascular:     Rate and Rhythm: Normal rate and regular rhythm.     Heart sounds: Murmur heard.  Pulmonary:     Effort: Pulmonary effort is normal. No respiratory distress.     Breath sounds: Normal breath sounds. No wheezing.  Abdominal:     General: Bowel sounds are normal. There is no distension.     Palpations: Abdomen is soft.     Tenderness: There is no abdominal tenderness.  Musculoskeletal:        General: No tenderness. Normal range of motion.     Cervical back: Normal range of motion and neck supple.  Skin:    General: Skin is warm and dry.          Comments: Small skin tag on left gluteal fold, erythemas on top of gluteal fold.   Neurological:     Mental Status: She is alert and oriented to person, place, and time.     Cranial Nerves: No cranial nerve deficit.     Deep Tendon Reflexes: Reflexes are normal and symmetric.  Psychiatric:        Behavior: Behavior normal.        Thought Content: Thought content normal.        Judgment: Judgment normal.       BP 131/77   Pulse 60    Temp 97.8 F (36.6 C) (Temporal)   Ht 5' 9.4" (1.763 m)   Wt 224 lb 3.2 oz (101.7 kg)   SpO2 97%   BMI 32.73 kg/m      Assessment & Plan:  Lori Ortiz comes in today with chief complaint of Wound Infection (On bottom )   Diagnosis and orders addressed:  1. Skin lesion - Ambulatory referral to Home Health  2. General weakness - Ambulatory referral to Home Health  3. Skin tag - Ambulatory referral to Home Health   4. Stage 4 chronic kidney disease (HCC)   Avoid squeezing or touching skin tag, area not infected There is a slightly redness of gluteal fold. Keep clean and dry. Use Bactroban cream BID, can soak in warm water.  Referral to PT pending for weakness. May start feeling better after dialysis is started.    Jannifer Rodney, FNP

## 2022-10-13 NOTE — Patient Instructions (Signed)
Skin Tag, Adult A skin tag (acrochordon) is a soft, extra growth of skin. Most skin tags are skin-colored and rarely bigger than a pencil eraser. They often form in areas where there is frequent rubbing, or friction, on the skin. This may be where there are folds in the skin, such as: The eyelids. The neck. The armpits. The groin. Skin tags are not dangerous, and they do not spread from person to person (are not contagious). You may have one skin tag or many. Skin tags do not need treatment. However, your health care provider may recommend removing a skin tag if it: Gets irritated from clothing or jewelry. Bleeds. Is visible and unsightly. What are the causes? This condition is linked to: Increasing age. Pregnancy. Diabetes. Obesity. What are the signs or symptoms? Skin tags usually do not cause symptoms unless they get irritated by items touching your skin, such as clothing or jewelry. When this happens, you may have pain, itching, or bleeding. How is this diagnosed? This condition is diagnosed with an evaluation from your health care provider. No testing is needed for diagnosis. How is this treated? Treatment for this condition depends on whether you have symptoms. Your health care provider may also remove your skin tag if it is visible or if you do not like the way it looks. A skin tag can be removed by a health care provider with: A simple surgical procedure using scissors. A procedure that involves freezing your skin tag with a gas in liquid form (liquid nitrogen). A procedure that uses heat to destroy your skin tag (electrodessication). Follow these instructions at home: Watch for any changes in your skin tag. A normal skin tag does not require any other special care at home. Take over-the-counter and prescription medicines only as told by your health care provider. Keep all follow-up visits. Contact a health care provider if: You have a skin tag that: Becomes painful. Changes  color. Bleeds. Swells. Summary Skin tags are soft, extra growths of skin found in areas of frequent rubbing or friction. Skin tags usually do not cause symptoms. If symptoms occur, you may have pain, itching, or bleeding. Your health care provider may remove your skin tag if it causes symptoms or if you do not like the way it looks. This information is not intended to replace advice given to you by your health care provider. Make sure you discuss any questions you have with your health care provider. Document Revised: 03/27/2021 Document Reviewed: 03/27/2021 Elsevier Patient Education  2024 [employer].

## 2022-10-15 ENCOUNTER — Ambulatory Visit: Payer: Medicare Other | Admitting: Family Medicine

## 2022-10-21 ENCOUNTER — Encounter (HOSPITAL_COMMUNITY): Payer: Medicare Other

## 2022-10-21 ENCOUNTER — Encounter: Payer: Medicare Other | Admitting: Emergency Medicine

## 2022-10-21 VITALS — BP 155/50 | HR 55 | Temp 98.1°F | Resp 18

## 2022-10-21 DIAGNOSIS — D631 Anemia in chronic kidney disease: Secondary | ICD-10-CM

## 2022-10-21 MED ORDER — EPOETIN ALFA-EPBX 3000 UNIT/ML IJ SOLN: 3000.0000 [IU] | Freq: Once | INTRAMUSCULAR | Status: DC

## 2022-10-21 NOTE — Addendum Note (Signed)
Addended by: Tera Mater on: 10/21/2022 10:51 AM   Modules accepted: Orders

## 2022-10-21 NOTE — Progress Notes (Signed)
Diagnosis: Anemia in Chronic Kidney Disease  Provider:  Celso Amy MD  Procedure: Injection  Retacrit (epoetin alfa-epbx), Dose: 3000 Units, Site: subcutaneous, Number of injections: 0  Hemoglobin 10.8   Post Care: Patient declined observation  Discharge: Condition: Good, Destination: Home . AVS Provided  Performed by:  Arrie Senate, RN

## 2022-10-24 ENCOUNTER — Other Ambulatory Visit: Payer: Self-pay

## 2022-10-24 ENCOUNTER — Encounter (HOSPITAL_COMMUNITY): Payer: Self-pay | Admitting: Vascular Surgery

## 2022-10-24 NOTE — Anesthesia Preprocedure Evaluation (Signed)
Anesthesia Evaluation  Patient identified by MRN, date of birth, ID band Patient awake    Reviewed: Allergy & Precautions, NPO status , Patient's Chart, lab work & pertinent test results  Airway Mallampati: I       Dental no notable dental hx.    Pulmonary sleep apnea and Continuous Positive Airway Pressure Ventilation , Current Smoker and Patient abstained from smoking.   Pulmonary exam normal        Cardiovascular hypertension, Pt. on medications Normal cardiovascular exam+ dysrhythmias Atrial Fibrillation      Neuro/Psych  negative psych ROS   GI/Hepatic ,GERD  ,,  Endo/Other  negative endocrine ROS    Renal/GU Renal InsufficiencyRenal diseaseCKD IV  negative genitourinary   Musculoskeletal negative musculoskeletal ROS (+)    Abdominal  (+) + obese  Peds  Hematology  (+) Blood dyscrasia, anemia   Anesthesia Other Findings MPRESSIONS     1. Left ventricular ejection fraction, by estimation, is 60 to 65%. The  left ventricle has normal function. The left ventricle has no regional  wall motion abnormalities. There is moderate left ventricular hypertrophy.  Left ventricular diastolic  parameters are consistent with Grade II diastolic dysfunction  (pseudonormalization). Elevated left atrial pressure. The average left  ventricular global longitudinal strain is -17.7 %. The global longitudinal  strain is normal.   2. Right ventricular systolic function is normal. The right ventricular  size is mildly enlarged. There is moderately elevated pulmonary artery  systolic pressure.   3. Left atrial size was severely dilated.   4. Right atrial size was mildly dilated.   5. The mitral valve is abnormal. Mild mitral valve regurgitation.   6. The tricuspid valve is abnormal.   7. The aortic valve is tricuspid. There is moderate calcification of the  aortic valve. There is moderate thickening of the aortic valve. Aortic   valve regurgitation is not visualized. Mild to moderate aortic valve  stenosis. Aortic valve mean gradient  measures 21.0 mmHg. Aortic valve peak gradient measures 26.8 mmHg. Aortic  valve area, by VTI measures 1.88 cm.   8. Aortic dilatation noted. There is moderate dilatation of the ascending  aorta, measuring 45 mm.   9. The inferior vena cava is dilated in size with >50% respiratory  variability, suggesting right atrial pressure of 8 mmHg.   Comparison(s): No prior study.   FINDINGS   Left Ventricle: Left ventricular ejection fraction, by    Reproductive/Obstetrics                             Anesthesia Physical Anesthesia Plan  ASA: 3  Anesthesia Plan: Regional and General   Post-op Pain Management: Regional block*   Induction:   PONV Risk Score and Plan: 2 and Ondansetron, Dexamethasone and Midazolam  Airway Management Planned: LMA  Additional Equipment: None  Intra-op Plan:   Post-operative Plan: Extubation in OR  Informed Consent: I have reviewed the patients History and Physical, chart, labs and discussed the procedure including the risks, benefits and alternatives for the proposed anesthesia with the patient or authorized representative who has indicated his/her understanding and acceptance.     Dental advisory given  Plan Discussed with: CRNA  Anesthesia Plan Comments: (PAT note by Antionette Poles, PA-C:  Follows with cardiology for history of pretension, permanent A-fib s/p Watchman device 2021, HFpEF, OSA on CPAP, and mild to moderate aortic stenosis.  Recent echo 09/11/2022 showed EF 65%, grade 2 DD, severely dilated  left atrium, mild much regurgitation, mild to moderate aortic stenosis with mean gradient 21 mmHg.  History of unprovoked hemorrhagic stroke in 2011.  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 Wolfgang Phoenix.  She is s/p first stage left arm basilic vein fistula creation by Dr. Arbie Cookey on 08/19/2022.  History of  anemia secondary to CKD, receives Retacrit, last on 10/21/2022.    Patient will need to have surgery labs and evaluation.  EKG 09/03/2022: Sinus bradycardia with first-degree AV block.  Nonspecific ST and T wave abnormality.  TTE 09/12/22: 1. Left ventricular ejection fraction, by estimation, is 60 to 65%. The  left ventricle has normal function. The left ventricle has no regional  wall motion abnormalities. There is moderate left ventricular hypertrophy.  Left ventricular diastolic  parameters are consistent with Grade II diastolic dysfunction  (pseudonormalization). Elevated left atrial pressure. The average left  ventricular global longitudinal strain is -17.7 %. The global longitudinal  strain is normal.  2. Right ventricular systolic function is normal. The right ventricular  size is mildly enlarged. There is moderately elevated pulmonary artery  systolic pressure.  3. Left atrial size was severely dilated.  4. Right atrial size was mildly dilated.  5. The mitral valve is abnormal. Mild mitral valve regurgitation.  6. The tricuspid valve is abnormal.  7. The aortic valve is tricuspid. There is moderate calcification of the  aortic valve. There is moderate thickening of the aortic valve. Aortic  valve regurgitation is not visualized. Mild to moderate aortic valve  stenosis. Aortic valve mean gradient  measures 21.0 mmHg. Aortic valve peak gradient measures 26.8 mmHg. Aortic  valve area, by VTI measures 1.88 cm.  8. Aortic dilatation noted. There is moderate dilatation of the ascending  aorta, measuring 45 mm.  9. The inferior vena cava is dilated in size with >50% respiratory  variability, suggesting right atrial pressure of 8 mmHg.   Nuclear stress 11/15/2019 (Care Everywhere): 1. No reversible ischemia or infarction.   2. Normal left ventricular wall motion.   3. Left ventricular ejection fraction 65%   4. Non invasive risk stratification*: Low    )         Anesthesia Quick Evaluation

## 2022-10-24 NOTE — Progress Notes (Signed)
Anesthesia Chart Review: Same-day workup  Follows with cardiology for history of pretension, permanent A-fib s/p Watchman device 2021, HFpEF, OSA on CPAP, and mild to moderate aortic stenosis.  Recent echo 09/11/2022 showed EF 65%, grade 2 DD, severely dilated left atrium, mild much regurgitation, mild to moderate aortic stenosis with mean gradient 21 mmHg.  History of unprovoked hemorrhagic stroke in 2011.  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 Wolfgang Phoenix.  She is s/p first stage left arm basilic vein fistula creation by Dr. Arbie Cookey on 08/19/2022.  History of anemia secondary to CKD, receives Retacrit, last on 10/21/2022.    Patient will need to have surgery labs and evaluation.  EKG 09/03/2022: Sinus bradycardia with first-degree AV block.  Nonspecific ST and T wave abnormality.  TTE 09/12/22:  1. Left ventricular ejection fraction, by estimation, is 60 to 65%. The  left ventricle has normal function. The left ventricle has no regional  wall motion abnormalities. There is moderate left ventricular hypertrophy.  Left ventricular diastolic  parameters are consistent with Grade II diastolic dysfunction  (pseudonormalization). Elevated left atrial pressure. The average left  ventricular global longitudinal strain is -17.7 %. The global longitudinal  strain is normal.   2. Right ventricular systolic function is normal. The right ventricular  size is mildly enlarged. There is moderately elevated pulmonary artery  systolic pressure.   3. Left atrial size was severely dilated.   4. Right atrial size was mildly dilated.   5. The mitral valve is abnormal. Mild mitral valve regurgitation.   6. The tricuspid valve is abnormal.   7. The aortic valve is tricuspid. There is moderate calcification of the  aortic valve. There is moderate thickening of the aortic valve. Aortic  valve regurgitation is not visualized. Mild to moderate aortic valve  stenosis. Aortic valve mean gradient  measures  21.0 mmHg. Aortic valve peak gradient measures 26.8 mmHg. Aortic  valve area, by VTI measures 1.88 cm.   8. Aortic dilatation noted. There is moderate dilatation of the ascending  aorta, measuring 45 mm.   9. The inferior vena cava is dilated in size with >50% respiratory  variability, suggesting right atrial pressure of 8 mmHg.   Nuclear stress 11/15/2019 (Care Everywhere): 1. No reversible ischemia or infarction.   2. Normal left ventricular wall motion.   3. Left ventricular ejection fraction 65%   4. Non invasive risk stratification*: Low    [name] [facility] Short Stay Center/Anesthesiology Phone [phone] 10/24/2022 3:38 PM

## 2022-10-24 NOTE — Progress Notes (Signed)
SDW call  Patient was given pre-op instructions over the phone. Patient verbalized understanding of instructions provided.     PCP - Jannifer Rodney, NP Cardiologist - Dr. Luane School Nephrologist: Dr. Nolberto Hanlon   PPM/ICD - denies Device Orders - n/a Rep Notified - n/a   Chest x-ray - 04/23/2022 EKG -  09/03/2022 Stress Test - ECHO - 09/11/2022 Cardiac Cath -   Sleep Study/sleep apnea/CPAP: Diagnosed with sleep apnea, wears a CPAP nightly  Non-diabetic   Blood Thinner Instructions: denies Aspirin Instructions: Continue   ERAS Protcol - No, NPO   COVID TEST- n/a    Anesthesia review: Yes. HTN, stroke, OSA with CPAP, dysrthmia   Patient denies shortness of breath, fever, cough and chest pain over the phone call  Your procedure is scheduled on Tuesday October 28, 2022  Report to [facility] Main Entrance "A" at 0530 A.M., then check in with the Admitting office.  Call this number if you have problems the morning of surgery:  [phone]   If you have any questions prior to your surgery date call [phone]: Open Monday-Friday 8am-4pm If you experience any cold or flu symptoms such as cough, fever, chills, shortness of breath, etc. between now and your scheduled surgery, please notify us at the above number    Remember:  Do not eat or drink after midnight the night before your surgery  Take these medicines the morning of surgery with A SIP OF WATER:  Norvasc, ASA, atorvastatin, protonix  As needed: Clear eye drops  As of today, STOP taking any Aleve, Naproxen, Ibuprofen, Motrin, Advil, Goody's, BC's, all herbal medications, fish oil, and all vitamins.

## 2022-10-28 ENCOUNTER — Other Ambulatory Visit: Payer: Self-pay

## 2022-10-28 ENCOUNTER — Encounter (HOSPITAL_COMMUNITY)
Admission: RE | Disposition: A | Discharge status: Home / Self Care | Payer: Self-pay | Source: Home / Self Care | Attending: Vascular Surgery

## 2022-10-28 ENCOUNTER — Encounter (HOSPITAL_COMMUNITY): Payer: Self-pay | Admitting: Vascular Surgery

## 2022-10-28 ENCOUNTER — Ambulatory Visit (HOSPITAL_COMMUNITY)
Admission: RE | Admit: 2022-10-28 | Discharge: 2022-10-28 | Disposition: A | Discharge status: Home / Self Care | Payer: Medicare Other | Attending: Vascular Surgery | Admitting: Vascular Surgery

## 2022-10-28 ENCOUNTER — Ambulatory Visit (HOSPITAL_COMMUNITY): Payer: Medicare Other | Admitting: Physician Assistant

## 2022-10-28 ENCOUNTER — Ambulatory Visit (HOSPITAL_BASED_OUTPATIENT_CLINIC_OR_DEPARTMENT_OTHER): Payer: Medicare Other | Admitting: Physician Assistant

## 2022-10-28 DIAGNOSIS — I4821 Permanent atrial fibrillation: Secondary | ICD-10-CM | POA: Insufficient documentation

## 2022-10-28 DIAGNOSIS — G4733 Obstructive sleep apnea (adult) (pediatric): Secondary | ICD-10-CM | POA: Insufficient documentation

## 2022-10-28 DIAGNOSIS — I130 Hypertensive heart and chronic kidney disease with heart failure and stage 1 through stage 4 chronic kidney disease, or unspecified chronic kidney disease: Secondary | ICD-10-CM | POA: Diagnosis not present

## 2022-10-28 DIAGNOSIS — E1122 Type 2 diabetes mellitus with diabetic chronic kidney disease: Secondary | ICD-10-CM | POA: Insufficient documentation

## 2022-10-28 DIAGNOSIS — I350 Nonrheumatic aortic (valve) stenosis: Secondary | ICD-10-CM | POA: Insufficient documentation

## 2022-10-28 DIAGNOSIS — I5032 Chronic diastolic (congestive) heart failure: Secondary | ICD-10-CM

## 2022-10-28 DIAGNOSIS — N186 End stage renal disease: Secondary | ICD-10-CM | POA: Diagnosis not present

## 2022-10-28 DIAGNOSIS — D631 Anemia in chronic kidney disease: Secondary | ICD-10-CM | POA: Diagnosis not present

## 2022-10-28 DIAGNOSIS — I129 Hypertensive chronic kidney disease with stage 1 through stage 4 chronic kidney disease, or unspecified chronic kidney disease: Secondary | ICD-10-CM | POA: Diagnosis present

## 2022-10-28 DIAGNOSIS — Z8673 Personal history of transient ischemic attack (TIA), and cerebral infarction without residual deficits: Secondary | ICD-10-CM | POA: Diagnosis not present

## 2022-10-28 DIAGNOSIS — N184 Chronic kidney disease, stage 4 (severe): Secondary | ICD-10-CM | POA: Insufficient documentation

## 2022-10-28 DIAGNOSIS — I480 Paroxysmal atrial fibrillation: Secondary | ICD-10-CM | POA: Diagnosis not present

## 2022-10-28 DIAGNOSIS — G8918 Other acute postprocedural pain: Secondary | ICD-10-CM | POA: Diagnosis not present

## 2022-10-28 HISTORY — DX: Dyspnea, unspecified: R06.00

## 2022-10-28 HISTORY — PX: BASCILIC VEIN TRANSPOSITION: SHX5742

## 2022-10-28 LAB — POCT I-STAT, CHEM 8
BUN: 49 mg/dL — ABNORMAL HIGH (ref 8–23)
Calcium, Ion: 1.22 mmol/L (ref 1.15–1.40)
Chloride: 112 mmol/L — ABNORMAL HIGH (ref 98–111)
Creatinine, Ser: 3.7 mg/dL — ABNORMAL HIGH (ref 0.44–1.00)
Glucose, Bld: 100 mg/dL — ABNORMAL HIGH (ref 70–99)
HCT: 30 % — ABNORMAL LOW (ref 36.0–46.0)
Hemoglobin: 10.2 g/dL — ABNORMAL LOW (ref 12.0–15.0)
Potassium: 3.9 mmol/L (ref 3.5–5.1)
Sodium: 144 mmol/L (ref 135–145)
TCO2: 21 mmol/L — ABNORMAL LOW (ref 22–32)

## 2022-10-28 SURGERY — TRANSPOSITION, VEIN, BASILIC
Anesthesia: Regional | Laterality: Left

## 2022-10-28 MED ORDER — MIDAZOLAM HCL 2 MG/2ML IJ SOLN
INTRAMUSCULAR | Status: AC
  Filled 2022-10-28: qty 2

## 2022-10-28 MED ORDER — 0.9 % SODIUM CHLORIDE (POUR BTL) OPTIME
TOPICAL | Status: DC | PRN
  Administered 2022-10-28: 1000 mL

## 2022-10-28 MED ORDER — OXYCODONE HCL 5 MG/5ML PO SOLN: 5.0000 mg | Freq: Once | ORAL | Status: DC | PRN

## 2022-10-28 MED ORDER — SODIUM CHLORIDE 0.9 % IV SOLN: INTRAVENOUS | Status: DC

## 2022-10-28 MED ORDER — OXYCODONE-ACETAMINOPHEN 5-325 MG PO TABS
1.0000 | ORAL_TABLET | Freq: Four times a day (QID) | ORAL | 0 refills | Status: AC | PRN
Start: 2022-10-28 — End: 2023-10-28

## 2022-10-28 MED ORDER — ACETAMINOPHEN 160 MG/5ML PO SOLN: 325.0000 mg | ORAL | Status: DC | PRN

## 2022-10-28 MED ORDER — CHLORHEXIDINE GLUCONATE 4 % EX SOLN: 60.0000 mL | Freq: Once | CUTANEOUS | Status: DC

## 2022-10-28 MED ORDER — DEXAMETHASONE SODIUM PHOSPHATE 10 MG/ML IJ SOLN
INTRAMUSCULAR | Status: AC
  Filled 2022-10-28: qty 1

## 2022-10-28 MED ORDER — ONDANSETRON HCL 4 MG/2ML IJ SOLN: 4.0000 mg | Freq: Once | INTRAMUSCULAR | Status: DC | PRN

## 2022-10-28 MED ORDER — ONDANSETRON HCL 4 MG/2ML IJ SOLN
INTRAMUSCULAR | Status: AC
  Filled 2022-10-28: qty 2

## 2022-10-28 MED ORDER — ONDANSETRON HCL 4 MG/2ML IJ SOLN
INTRAMUSCULAR | Status: DC | PRN
  Administered 2022-10-28: 4 mg via INTRAVENOUS

## 2022-10-28 MED ORDER — HEMOSTATIC AGENTS (NO CHARGE) OPTIME
TOPICAL | Status: DC | PRN
Start: 2022-10-28 — End: 2022-10-28
  Administered 2022-10-28: 2 via TOPICAL

## 2022-10-28 MED ORDER — CEFAZOLIN SODIUM-DEXTROSE 2-4 GM/100ML-% IV SOLN
2.0000 g | INTRAVENOUS | Status: AC
  Administered 2022-10-28: 2 g via INTRAVENOUS
  Filled 2022-10-28: qty 100

## 2022-10-28 MED ORDER — PHENYLEPHRINE 80 MCG/ML (10ML) SYRINGE FOR IV PUSH (FOR BLOOD PRESSURE SUPPORT)
PREFILLED_SYRINGE | INTRAVENOUS | Status: AC
  Filled 2022-10-28: qty 10

## 2022-10-28 MED ORDER — OXYCODONE HCL 5 MG PO TABS: 5.0000 mg | ORAL_TABLET | Freq: Once | ORAL | Status: DC | PRN

## 2022-10-28 MED ORDER — HEPARIN SODIUM (PORCINE) 1000 UNIT/ML IJ SOLN
INTRAMUSCULAR | Status: AC
  Filled 2022-10-28: qty 10

## 2022-10-28 MED ORDER — MIDAZOLAM HCL 2 MG/2ML IJ SOLN
INTRAMUSCULAR | Status: DC | PRN
  Administered 2022-10-28 (×2): 1 mg via INTRAVENOUS

## 2022-10-28 MED ORDER — HEPARIN SODIUM (PORCINE) 1000 UNIT/ML IJ SOLN
INTRAMUSCULAR | Status: DC | PRN
Start: 2022-10-28 — End: 2022-10-28
  Administered 2022-10-28: 2000 [IU] via INTRAVENOUS

## 2022-10-28 MED ORDER — HEPARIN 6000 UNIT IRRIGATION SOLUTION
Status: AC
  Filled 2022-10-28: qty 500

## 2022-10-28 MED ORDER — CHLORHEXIDINE GLUCONATE 0.12 % MT SOLN
15.0000 mL | Freq: Once | OROMUCOSAL | Status: AC
  Administered 2022-10-28: 15 mL via OROMUCOSAL
  Filled 2022-10-28: qty 15

## 2022-10-28 MED ORDER — FENTANYL CITRATE (PF) 250 MCG/5ML IJ SOLN
INTRAMUSCULAR | Status: DC | PRN
  Administered 2022-10-28 (×3): 50 ug via INTRAVENOUS

## 2022-10-28 MED ORDER — FENTANYL CITRATE (PF) 250 MCG/5ML IJ SOLN
INTRAMUSCULAR | Status: AC
  Filled 2022-10-28: qty 5

## 2022-10-28 MED ORDER — ACETAMINOPHEN 325 MG PO TABS: 325.0000 mg | ORAL_TABLET | ORAL | Status: DC | PRN

## 2022-10-28 MED ORDER — DEXAMETHASONE SODIUM PHOSPHATE 10 MG/ML IJ SOLN
INTRAMUSCULAR | Status: DC | PRN
  Administered 2022-10-28: 5 mg via INTRAVENOUS

## 2022-10-28 MED ORDER — FENTANYL CITRATE (PF) 100 MCG/2ML IJ SOLN: 25.0000 ug | INTRAMUSCULAR | Status: DC | PRN

## 2022-10-28 MED ORDER — PROPOFOL 10 MG/ML IV BOLUS
INTRAVENOUS | Status: DC | PRN
  Administered 2022-10-28: 170 mg via INTRAVENOUS

## 2022-10-28 MED ORDER — HEPARIN 6000 UNIT IRRIGATION SOLUTION
Status: DC | PRN
  Administered 2022-10-28: 1

## 2022-10-28 MED ORDER — PROTAMINE SULFATE 10 MG/ML IV SOLN
INTRAVENOUS | Status: DC | PRN
  Administered 2022-10-28: 20 mg via INTRAVENOUS

## 2022-10-28 MED ORDER — LIDOCAINE HCL (PF) 1 % IJ SOLN
INTRAMUSCULAR | Status: AC
  Filled 2022-10-28: qty 30

## 2022-10-28 MED ORDER — ROPIVACAINE HCL 5 MG/ML IJ SOLN
INTRAMUSCULAR | Status: DC | PRN
Start: 2022-10-28 — End: 2022-10-28
  Administered 2022-10-28: 5 mL via PERINEURAL
  Administered 2022-10-28: 2 mL via PERINEURAL
  Administered 2022-10-28: 5 mL via PERINEURAL
  Administered 2022-10-28: 2 mL via PERINEURAL

## 2022-10-28 MED ORDER — LACTATED RINGERS IV SOLN: INTRAVENOUS | Status: DC

## 2022-10-28 MED ORDER — LIDOCAINE 2% (20 MG/ML) 5 ML SYRINGE
INTRAMUSCULAR | Status: DC | PRN
  Administered 2022-10-28: 60 mg via INTRAVENOUS

## 2022-10-28 MED ORDER — ORAL CARE MOUTH RINSE: 15.0000 mL | Freq: Once | OROMUCOSAL | Status: AC

## 2022-10-28 SURGICAL SUPPLY — 46 items
ADH SKN CLS APL DERMABOND .7 (GAUZE/BANDAGES/DRESSINGS) ×1
ARMBAND PINK RESTRICT EXTREMIT (MISCELLANEOUS) ×1 IMPLANT
BAG COUNTER SPONGE SURGICOUNT (BAG) ×1 IMPLANT
BAG SPNG CNTER NS LX DISP (BAG)
BNDG CMPR 5X4 KNIT ELC UNQ LF (GAUZE/BANDAGES/DRESSINGS) ×1
BNDG CMPR 5X6 CHSV STRCH STRL (GAUZE/BANDAGES/DRESSINGS) ×1
BNDG COHESIVE 6X5 TAN ST LF (GAUZE/BANDAGES/DRESSINGS) IMPLANT
BNDG ELASTIC 4INX 5YD STR LF (GAUZE/BANDAGES/DRESSINGS) IMPLANT
BNDG ELASTIC 4X5.8 VLCR STR LF (GAUZE/BANDAGES/DRESSINGS) ×1 IMPLANT
CANISTER SUCT 3000ML PPV (MISCELLANEOUS) ×1 IMPLANT
CLIP TI MEDIUM 24 (CLIP) ×1 IMPLANT
CLIP TI WIDE RED SMALL 24 (CLIP) ×1 IMPLANT
COVER PROBE W GEL 5X96 (DRAPES) ×1 IMPLANT
DERMABOND ADVANCED .7 DNX12 (GAUZE/BANDAGES/DRESSINGS) ×1 IMPLANT
ELECT REM PT RETURN 9FT ADLT (ELECTROSURGICAL) ×1
ELECTRODE REM PT RTRN 9FT ADLT (ELECTROSURGICAL) ×1 IMPLANT
GLOVE BIOGEL PI IND STRL 8 (GLOVE) ×1 IMPLANT
GOWN STRL REUS W/ TWL LRG LVL3 (GOWN DISPOSABLE) ×2 IMPLANT
GOWN STRL REUS W/TWL 2XL LVL3 (GOWN DISPOSABLE) ×2 IMPLANT
GOWN STRL REUS W/TWL LRG LVL3 (GOWN DISPOSABLE) ×2
HEMOSTAT SNOW SURGICEL 2X4 (HEMOSTASIS) IMPLANT
KIT BASIN OR (CUSTOM PROCEDURE TRAY) ×1 IMPLANT
KIT TURNOVER KIT B (KITS) ×1 IMPLANT
NDL HYPO 25GX1X1/2 BEV (NEEDLE) ×1 IMPLANT
NEEDLE HYPO 25GX1X1/2 BEV (NEEDLE) ×1 IMPLANT
NS IRRIG 1000ML POUR BTL (IV SOLUTION) ×1 IMPLANT
PACK CV ACCESS (CUSTOM PROCEDURE TRAY) ×1 IMPLANT
PAD ARMBOARD 7.5X6 YLW CONV (MISCELLANEOUS) ×2 IMPLANT
SLING ARM FOAM STRAP LRG (SOFTGOODS) IMPLANT
SLING ARM FOAM STRAP MED (SOFTGOODS) IMPLANT
SPIKE FLUID TRANSFER (MISCELLANEOUS) ×1 IMPLANT
SPONGE T-LAP 18X18 ~~LOC~~+RFID (SPONGE) IMPLANT
SUT MNCRL AB 4-0 PS2 18 (SUTURE) ×1 IMPLANT
SUT PROLENE 6 0 BV (SUTURE) ×1 IMPLANT
SUT PROLENE 7 0 BV 1 (SUTURE) IMPLANT
SUT SILK 2 0 SH (SUTURE) IMPLANT
SUT SILK 2 0 SH CR/8 (SUTURE) ×1 IMPLANT
SUT SILK 3 0 (SUTURE) ×1
SUT SILK 3-0 18XBRD TIE 12 (SUTURE) IMPLANT
SUT VIC AB 2-0 CT1 27 (SUTURE) ×2
SUT VIC AB 2-0 CT1 TAPERPNT 27 (SUTURE) ×1 IMPLANT
SUT VIC AB 3-0 SH 27 (SUTURE) ×3
SUT VIC AB 3-0 SH 27X BRD (SUTURE) ×2 IMPLANT
TOWEL GREEN STERILE (TOWEL DISPOSABLE) ×1 IMPLANT
UNDERPAD 30X36 HEAVY ABSORB (UNDERPADS AND DIAPERS) ×1 IMPLANT
WATER STERILE IRR 1000ML POUR (IV SOLUTION) ×1 IMPLANT

## 2022-10-28 NOTE — Anesthesia Procedure Notes (Signed)
Procedure Name: LMA Insertion Date/Time: 10/28/2022 7:45 AM  Performed by: Gus Puma, CRNAPre-anesthesia Checklist: Patient identified, Emergency Drugs available, Suction available and Patient being monitored Patient Re-evaluated:Patient Re-evaluated prior to induction Oxygen Delivery Method: Circle System Utilized Preoxygenation: Pre-oxygenation with 100% oxygen Induction Type: IV induction Ventilation: Mask ventilation without difficulty LMA: LMA inserted LMA Size: 4.0 Number of attempts: 1 Airway Equipment and Method: Bite block Placement Confirmation: positive ETCO2 Tube secured with: Tape Dental Injury: Teeth and Oropharynx as per pre-operative assessment

## 2022-10-28 NOTE — Op Note (Signed)
    NAME: Lori Ortiz    MRN: [id number] DOB: 1955-03-11    DATE OF OPERATION: 10/28/2022  PREOP DIAGNOSIS:    Chronic kidney disease 4  POSTOP DIAGNOSIS:    Same  PROCEDURE:    Left arm brachiobasilic fistula transposition   SURGEON: Victorino Sparrow  ASSIST: Dr. Carolynn Sayers  ANESTHESIA: General   EBL: 50ml  INDICATIONS:    [name] is a 67 y.o. female with previous history of first stage brachiobasilic fistula creation in need for transposition prior to HD access.   FINDINGS:    8mm basilic vein  TECHNIQUE:   Patient was brought to the OR and laid in supine position.  Moderate anesthesia was induced. The patient was prepped and draped in standard fashion.  Lidocaine was brought to the field and a local block was performed.   The case began with left arm ultrasound fistula mapping.  Multiple branches were noted and marked.  Three longitudinal skip incisions were made along the course of the basilic vein with 5 cm bridges.  This was carried through the subcutaneous fat to the brachiobasilic fistula.  The fistula was mobilized and multiple branches ligated using 2-0 silk and clips.  Once mobilized, a gore tunneler was brought on to the field, and a subcutaneous tunnel was made along the medial aspect of the bicep. Next, the patient as heparinized, fistula marked, clamped and transected. The vein was pulled through the tunnel tract and reanastomosed using 6.0 prolene suture in running fashion in the axilla. The wound bed was irrigated with saline, hemostasis achieved with suture and cautery. The wounds were closed with layers of vicryl suture with monocryl and dermabond at the skin. There was a palpable thrill in the fistula at case completion with excellent signal in the wrist.    Given the complexity of the case,  the assistant was necessary in order to expedient the procedure and safely perform the technical aspects of the operation.  The assistant provided traction and  countertraction to assist with exposure of the artery and vein.  They also assisted with suture ligation of multiple venous branches.  They played a critical role in the anastomosis. These skills, especially following the Prolene suture for the anastomosis, could not have been adequately performed by a scrub tech assistant.   Victorino Sparrow, MD [facility] DATE OF DICTATION:   10/28/2022

## 2022-10-28 NOTE — Anesthesia Postprocedure Evaluation (Signed)
Anesthesia Post Note  Patient: Lori Ortiz  Procedure(s) Performed: LEFT ARM SECOND STAGE BASILIC VEIN TRANSPOSITION (Left)     Patient location during evaluation: PACU Anesthesia Type: Regional and General Level of consciousness: awake Pain management: pain level controlled Vital Signs Assessment: post-procedure vital signs reviewed and stable Respiratory status: spontaneous breathing Cardiovascular status: stable Postop Assessment: no apparent nausea or vomiting Anesthetic complications: no  No notable events documented.  Last Vitals:  Vitals:   10/28/22 1000 10/28/22 1015  BP: (!) 153/62 (!) 144/60  Pulse: 70 66  Resp: 20 18  Temp:  36.9 C  SpO2: 92% 93%    Last Pain:  Vitals:   10/28/22 1000  PainSc: 0-No pain                 John F Mauro Arps Jr

## 2022-10-28 NOTE — H&P (Signed)
  POST OPERATIVE OFFICE NOTE    Patient seen and examined in preop holding.  No complaints. No changes to medication history or physical exam since last seen in clinic. After discussing the risks and benefits of left arm fistula transposition, Lori Ortiz elected to proceed.   Victorino Sparrow MD   CC:  F/u for surgery  HPI:  This is a 67 y.o. female who is s/p first stage left arm basilic vein fistula creation by Dr. Arbie Cookey on 08/19/2022.  She denies any steal symptoms in her left hand.  She believes her incision is well-healed.  She is not yet on hemodialysis.  CKD is managed by Dr. Wolfgang Phoenix in [place].  No Known Allergies  Current Facility-Administered Medications  Medication Dose Route Frequency Provider Last Rate Last Admin   0.9 %  sodium chloride infusion   Intravenous Continuous Victorino Sparrow, MD 10 mL/hr at 10/28/22 0630 New Bag at 10/28/22 0630   0.9 % irrigation (POUR BTL)    PRN Victorino Sparrow, MD   1,000 mL at 10/28/22 0720   ceFAZolin (ANCEF) IVPB 2g/100 mL premix  2 g Intravenous 30 min Pre-Op Victorino Sparrow, MD       chlorhexidine (HIBICLENS) 4 % liquid 4 Application  60 mL Topical Once Victorino Sparrow, MD       And   chlorhexidine (HIBICLENS) 4 % liquid 4 Application  60 mL Topical Once Victorino Sparrow, MD       heparin 6000 units / NS 500 mL irrigation    PRN Victorino Sparrow, MD   1 Application at 10/28/22 0720   lactated ringers infusion   Intravenous Continuous Kiel, Mosetta Putt, MD         ROS:  See HPI  Physical Exam:  Vitals:   10/24/22 1228 10/28/22 0550  BP:  (!) 158/63  Pulse:  (!) 52  Resp:  18  Temp:  98 F (36.7 C)  SpO2:  95%  Weight: 100.2 kg 100.7 kg  Height: 5\' 9"  (1.753 m) 5\' 9"  (1.753 m)    Incision:  L arm incision c/d/i Extremities:  palpable L radial pulse; palpable thrill through basilic vein in distal upper arm; symmetrical grip strength Neuro: A&O  Assessment/Plan:  This is a 67 y.o. female who is s/p: Left arm first  stage basilic vein transposition by Dr. Arbie Cookey  -Left hand well-perfused with palpable radial pulse and no signs or symptoms of steal syndrome -Based on duplex, basilic vein has matured and is ready for second stage -Plan will be left arm second stage basilic vein transposition by the next available surgeon.  Case was discussed in detail with the patient and all questions were answered.  She is agreeable to proceed.   Victorino Sparrow, PA-C [facility] [phone]  Clinic MD:  Edilia Bo

## 2022-10-28 NOTE — Transfer of Care (Signed)
Immediate Anesthesia Transfer of Care Note  Patient: Lori Ortiz  Procedure(s) Performed: LEFT ARM SECOND STAGE BASILIC VEIN TRANSPOSITION (Left)  Patient Location: PACU  Anesthesia Type:General and Regional  Level of Consciousness: awake, drowsy, and patient cooperative  Airway & Oxygen Therapy: Patient Spontanous Breathing and Patient connected to nasal cannula oxygen  Post-op Assessment: Report given to RN and Post -op Vital signs reviewed and stable  Post vital signs: Reviewed and stable  Last Vitals:  Vitals Value Taken Time  BP 150/66 10/28/22 0934  Temp    Pulse 68 10/28/22 0938  Resp 23 10/28/22 0938  SpO2 92 % 10/28/22 0938  Vitals shown include unfiled device data.  Last Pain:  Vitals:   10/28/22 0615  PainSc: 0-No pain         Complications: No notable events documented.

## 2022-10-28 NOTE — Anesthesia Procedure Notes (Signed)
Anesthesia Regional Block: Interscalene brachial plexus block   Pre-Anesthetic Checklist: , timeout performed,  Correct Patient, Correct Site, Correct Laterality,  Correct Procedure, Correct Position, site marked,  Risks and benefits discussed,  Surgical consent,  Pre-op evaluation,  At surgeon's request and post-op pain management  Laterality: Left and Upper  Prep: chloraprep       Needles:  Injection technique: Single-shot  Needle Type: Echogenic Stimulator Needle     Needle Length: 9cm  Needle Gauge: 20   Needle insertion depth: 1 cm   Additional Needles:   Procedures:,,,, ultrasound used (permanent image in chart),,    Narrative:  Start time: 10/28/2022 7:22 AM End time: 10/28/2022 7:29 AM Injection made incrementally with aspirations every 5 mL.  Performed by: Personally  Anesthesiologist: Leilani Able, MD

## 2022-10-28 NOTE — Discharge Instructions (Signed)

## 2022-10-29 ENCOUNTER — Encounter: Payer: Self-pay | Admitting: Nephrology

## 2022-10-29 ENCOUNTER — Encounter (HOSPITAL_COMMUNITY): Payer: Self-pay | Admitting: Vascular Surgery

## 2022-10-29 NOTE — Addendum Note (Signed)
Addended by: Kandra Nicolas on: 10/29/2022 02:38 PM   Modules accepted: Orders

## 2022-11-01 DIAGNOSIS — Z1152 Encounter for screening for COVID-19: Secondary | ICD-10-CM | POA: Diagnosis not present

## 2022-11-01 DIAGNOSIS — E43 Unspecified severe protein-calorie malnutrition: Secondary | ICD-10-CM | POA: Diagnosis not present

## 2022-11-01 DIAGNOSIS — Z7401 Bed confinement status: Secondary | ICD-10-CM | POA: Diagnosis not present

## 2022-11-01 DIAGNOSIS — N185 Chronic kidney disease, stage 5: Secondary | ICD-10-CM | POA: Diagnosis not present

## 2022-11-01 DIAGNOSIS — F17210 Nicotine dependence, cigarettes, uncomplicated: Secondary | ICD-10-CM | POA: Diagnosis not present

## 2022-11-01 DIAGNOSIS — E872 Acidosis, unspecified: Secondary | ICD-10-CM | POA: Diagnosis not present

## 2022-11-01 DIAGNOSIS — N186 End stage renal disease: Secondary | ICD-10-CM | POA: Diagnosis not present

## 2022-11-01 DIAGNOSIS — R739 Hyperglycemia, unspecified: Secondary | ICD-10-CM | POA: Diagnosis not present

## 2022-11-01 DIAGNOSIS — E785 Hyperlipidemia, unspecified: Secondary | ICD-10-CM | POA: Diagnosis not present

## 2022-11-01 DIAGNOSIS — I482 Chronic atrial fibrillation, unspecified: Secondary | ICD-10-CM | POA: Diagnosis not present

## 2022-11-01 DIAGNOSIS — Z95828 Presence of other vascular implants and grafts: Secondary | ICD-10-CM | POA: Diagnosis not present

## 2022-11-01 DIAGNOSIS — I120 Hypertensive chronic kidney disease with stage 5 chronic kidney disease or end stage renal disease: Secondary | ICD-10-CM | POA: Diagnosis not present

## 2022-11-01 DIAGNOSIS — G928 Other toxic encephalopathy: Secondary | ICD-10-CM | POA: Diagnosis not present

## 2022-11-01 DIAGNOSIS — Z720 Tobacco use: Secondary | ICD-10-CM | POA: Diagnosis not present

## 2022-11-01 DIAGNOSIS — Z992 Dependence on renal dialysis: Secondary | ICD-10-CM | POA: Diagnosis not present

## 2022-11-01 DIAGNOSIS — R531 Weakness: Secondary | ICD-10-CM | POA: Diagnosis not present

## 2022-11-01 DIAGNOSIS — Z7982 Long term (current) use of aspirin: Secondary | ICD-10-CM | POA: Diagnosis not present

## 2022-11-01 DIAGNOSIS — I161 Hypertensive emergency: Secondary | ICD-10-CM | POA: Diagnosis not present

## 2022-11-01 DIAGNOSIS — Z452 Encounter for adjustment and management of vascular access device: Secondary | ICD-10-CM | POA: Diagnosis not present

## 2022-11-01 DIAGNOSIS — I619 Nontraumatic intracerebral hemorrhage, unspecified: Secondary | ICD-10-CM | POA: Diagnosis not present

## 2022-11-01 DIAGNOSIS — Z7952 Long term (current) use of systemic steroids: Secondary | ICD-10-CM | POA: Diagnosis not present

## 2022-11-01 DIAGNOSIS — I509 Heart failure, unspecified: Secondary | ICD-10-CM | POA: Diagnosis not present

## 2022-11-01 DIAGNOSIS — R29707 NIHSS score 7: Secondary | ICD-10-CM | POA: Diagnosis not present

## 2022-11-01 DIAGNOSIS — I440 Atrioventricular block, first degree: Secondary | ICD-10-CM | POA: Diagnosis not present

## 2022-11-01 DIAGNOSIS — I10 Essential (primary) hypertension: Secondary | ICD-10-CM | POA: Diagnosis not present

## 2022-11-01 DIAGNOSIS — R918 Other nonspecific abnormal finding of lung field: Secondary | ICD-10-CM | POA: Diagnosis not present

## 2022-11-01 DIAGNOSIS — I160 Hypertensive urgency: Secondary | ICD-10-CM | POA: Diagnosis not present

## 2022-11-01 DIAGNOSIS — K219 Gastro-esophageal reflux disease without esophagitis: Secondary | ICD-10-CM | POA: Diagnosis not present

## 2022-11-01 DIAGNOSIS — I613 Nontraumatic intracerebral hemorrhage in brain stem: Secondary | ICD-10-CM | POA: Diagnosis not present

## 2022-11-01 DIAGNOSIS — N184 Chronic kidney disease, stage 4 (severe): Secondary | ICD-10-CM | POA: Diagnosis not present

## 2022-11-01 DIAGNOSIS — E46 Unspecified protein-calorie malnutrition: Secondary | ICD-10-CM | POA: Diagnosis not present

## 2022-11-01 DIAGNOSIS — N189 Chronic kidney disease, unspecified: Secondary | ICD-10-CM | POA: Diagnosis not present

## 2022-11-01 DIAGNOSIS — Z8673 Personal history of transient ischemic attack (TIA), and cerebral infarction without residual deficits: Secondary | ICD-10-CM | POA: Diagnosis not present

## 2022-11-01 DIAGNOSIS — D649 Anemia, unspecified: Secondary | ICD-10-CM | POA: Diagnosis not present

## 2022-11-01 DIAGNOSIS — N179 Acute kidney failure, unspecified: Secondary | ICD-10-CM | POA: Diagnosis not present

## 2022-11-01 DIAGNOSIS — Z95818 Presence of other cardiac implants and grafts: Secondary | ICD-10-CM | POA: Diagnosis not present

## 2022-11-14 DIAGNOSIS — Z8673 Personal history of transient ischemic attack (TIA), and cerebral infarction without residual deficits: Secondary | ICD-10-CM | POA: Diagnosis not present

## 2022-11-14 DIAGNOSIS — F17210 Nicotine dependence, cigarettes, uncomplicated: Secondary | ICD-10-CM | POA: Diagnosis not present

## 2022-11-14 DIAGNOSIS — E785 Hyperlipidemia, unspecified: Secondary | ICD-10-CM | POA: Diagnosis not present

## 2022-11-14 DIAGNOSIS — Z992 Dependence on renal dialysis: Secondary | ICD-10-CM | POA: Diagnosis not present

## 2022-11-14 DIAGNOSIS — N186 End stage renal disease: Secondary | ICD-10-CM | POA: Diagnosis not present

## 2022-11-14 DIAGNOSIS — Z95818 Presence of other cardiac implants and grafts: Secondary | ICD-10-CM | POA: Diagnosis not present

## 2022-11-14 DIAGNOSIS — I120 Hypertensive chronic kidney disease with stage 5 chronic kidney disease or end stage renal disease: Secondary | ICD-10-CM | POA: Diagnosis not present

## 2022-11-14 DIAGNOSIS — K219 Gastro-esophageal reflux disease without esophagitis: Secondary | ICD-10-CM | POA: Diagnosis not present

## 2022-11-15 DIAGNOSIS — Z992 Dependence on renal dialysis: Secondary | ICD-10-CM | POA: Diagnosis not present

## 2022-11-15 DIAGNOSIS — N186 End stage renal disease: Secondary | ICD-10-CM | POA: Diagnosis not present

## 2022-11-18 DIAGNOSIS — Z992 Dependence on renal dialysis: Secondary | ICD-10-CM | POA: Diagnosis not present

## 2022-11-18 DIAGNOSIS — Z1329 Encounter for screening for other suspected endocrine disorder: Secondary | ICD-10-CM | POA: Diagnosis not present

## 2022-11-18 DIAGNOSIS — E785 Hyperlipidemia, unspecified: Secondary | ICD-10-CM | POA: Diagnosis not present

## 2022-11-18 DIAGNOSIS — N186 End stage renal disease: Secondary | ICD-10-CM | POA: Diagnosis not present

## 2022-11-18 DIAGNOSIS — Z131 Encounter for screening for diabetes mellitus: Secondary | ICD-10-CM | POA: Diagnosis not present

## 2022-11-18 DIAGNOSIS — Z79899 Other long term (current) drug therapy: Secondary | ICD-10-CM | POA: Diagnosis not present

## 2022-11-19 DIAGNOSIS — I120 Hypertensive chronic kidney disease with stage 5 chronic kidney disease or end stage renal disease: Secondary | ICD-10-CM | POA: Diagnosis not present

## 2022-11-19 DIAGNOSIS — N186 End stage renal disease: Secondary | ICD-10-CM | POA: Diagnosis not present

## 2022-11-19 DIAGNOSIS — Z95818 Presence of other cardiac implants and grafts: Secondary | ICD-10-CM | POA: Diagnosis not present

## 2022-11-19 DIAGNOSIS — Z8673 Personal history of transient ischemic attack (TIA), and cerebral infarction without residual deficits: Secondary | ICD-10-CM | POA: Diagnosis not present

## 2022-11-19 DIAGNOSIS — I10 Essential (primary) hypertension: Secondary | ICD-10-CM | POA: Diagnosis not present

## 2022-11-19 DIAGNOSIS — K219 Gastro-esophageal reflux disease without esophagitis: Secondary | ICD-10-CM | POA: Diagnosis not present

## 2022-11-19 DIAGNOSIS — F17210 Nicotine dependence, cigarettes, uncomplicated: Secondary | ICD-10-CM | POA: Diagnosis not present

## 2022-11-19 DIAGNOSIS — N185 Chronic kidney disease, stage 5: Secondary | ICD-10-CM | POA: Diagnosis not present

## 2022-11-19 DIAGNOSIS — E785 Hyperlipidemia, unspecified: Secondary | ICD-10-CM | POA: Diagnosis not present

## 2022-11-19 DIAGNOSIS — Z992 Dependence on renal dialysis: Secondary | ICD-10-CM | POA: Diagnosis not present

## 2022-11-20 DIAGNOSIS — Z992 Dependence on renal dialysis: Secondary | ICD-10-CM | POA: Diagnosis not present

## 2022-11-20 DIAGNOSIS — N186 End stage renal disease: Secondary | ICD-10-CM | POA: Diagnosis not present

## 2022-11-22 DIAGNOSIS — N186 End stage renal disease: Secondary | ICD-10-CM | POA: Diagnosis not present

## 2022-11-22 DIAGNOSIS — Z992 Dependence on renal dialysis: Secondary | ICD-10-CM | POA: Diagnosis not present

## 2022-11-24 DIAGNOSIS — N186 End stage renal disease: Secondary | ICD-10-CM | POA: Diagnosis not present

## 2022-11-24 DIAGNOSIS — E785 Hyperlipidemia, unspecified: Secondary | ICD-10-CM | POA: Diagnosis not present

## 2022-11-24 DIAGNOSIS — I120 Hypertensive chronic kidney disease with stage 5 chronic kidney disease or end stage renal disease: Secondary | ICD-10-CM | POA: Diagnosis not present

## 2022-11-24 DIAGNOSIS — K219 Gastro-esophageal reflux disease without esophagitis: Secondary | ICD-10-CM | POA: Diagnosis not present

## 2022-11-24 DIAGNOSIS — F17210 Nicotine dependence, cigarettes, uncomplicated: Secondary | ICD-10-CM | POA: Diagnosis not present

## 2022-11-24 DIAGNOSIS — Z8673 Personal history of transient ischemic attack (TIA), and cerebral infarction without residual deficits: Secondary | ICD-10-CM | POA: Diagnosis not present

## 2022-11-24 DIAGNOSIS — Z95818 Presence of other cardiac implants and grafts: Secondary | ICD-10-CM | POA: Diagnosis not present

## 2022-11-24 DIAGNOSIS — Z992 Dependence on renal dialysis: Secondary | ICD-10-CM | POA: Diagnosis not present

## 2022-11-25 DIAGNOSIS — N186 End stage renal disease: Secondary | ICD-10-CM | POA: Diagnosis not present

## 2022-11-25 DIAGNOSIS — K219 Gastro-esophageal reflux disease without esophagitis: Secondary | ICD-10-CM | POA: Diagnosis not present

## 2022-11-25 DIAGNOSIS — Z992 Dependence on renal dialysis: Secondary | ICD-10-CM | POA: Diagnosis not present

## 2022-11-25 DIAGNOSIS — Z95818 Presence of other cardiac implants and grafts: Secondary | ICD-10-CM | POA: Diagnosis not present

## 2022-11-25 DIAGNOSIS — F17210 Nicotine dependence, cigarettes, uncomplicated: Secondary | ICD-10-CM | POA: Diagnosis not present

## 2022-11-25 DIAGNOSIS — I120 Hypertensive chronic kidney disease with stage 5 chronic kidney disease or end stage renal disease: Secondary | ICD-10-CM | POA: Diagnosis not present

## 2022-11-25 DIAGNOSIS — E785 Hyperlipidemia, unspecified: Secondary | ICD-10-CM | POA: Diagnosis not present

## 2022-11-25 DIAGNOSIS — Z8673 Personal history of transient ischemic attack (TIA), and cerebral infarction without residual deficits: Secondary | ICD-10-CM | POA: Diagnosis not present

## 2022-11-26 DIAGNOSIS — Z992 Dependence on renal dialysis: Secondary | ICD-10-CM | POA: Diagnosis not present

## 2022-11-26 DIAGNOSIS — N186 End stage renal disease: Secondary | ICD-10-CM | POA: Diagnosis not present

## 2022-11-26 DIAGNOSIS — K219 Gastro-esophageal reflux disease without esophagitis: Secondary | ICD-10-CM | POA: Diagnosis not present

## 2022-11-26 DIAGNOSIS — Z95818 Presence of other cardiac implants and grafts: Secondary | ICD-10-CM | POA: Diagnosis not present

## 2022-11-26 DIAGNOSIS — I120 Hypertensive chronic kidney disease with stage 5 chronic kidney disease or end stage renal disease: Secondary | ICD-10-CM | POA: Diagnosis not present

## 2022-11-26 DIAGNOSIS — E785 Hyperlipidemia, unspecified: Secondary | ICD-10-CM | POA: Diagnosis not present

## 2022-11-26 DIAGNOSIS — Z8673 Personal history of transient ischemic attack (TIA), and cerebral infarction without residual deficits: Secondary | ICD-10-CM | POA: Diagnosis not present

## 2022-11-26 DIAGNOSIS — F17210 Nicotine dependence, cigarettes, uncomplicated: Secondary | ICD-10-CM | POA: Diagnosis not present

## 2022-11-27 DIAGNOSIS — Z992 Dependence on renal dialysis: Secondary | ICD-10-CM | POA: Diagnosis not present

## 2022-11-27 DIAGNOSIS — N186 End stage renal disease: Secondary | ICD-10-CM | POA: Diagnosis not present

## 2022-11-29 DIAGNOSIS — N186 End stage renal disease: Secondary | ICD-10-CM | POA: Diagnosis not present

## 2022-11-29 DIAGNOSIS — Z992 Dependence on renal dialysis: Secondary | ICD-10-CM | POA: Diagnosis not present

## 2022-12-01 DIAGNOSIS — E785 Hyperlipidemia, unspecified: Secondary | ICD-10-CM | POA: Diagnosis not present

## 2022-12-01 DIAGNOSIS — F17210 Nicotine dependence, cigarettes, uncomplicated: Secondary | ICD-10-CM | POA: Diagnosis not present

## 2022-12-01 DIAGNOSIS — K219 Gastro-esophageal reflux disease without esophagitis: Secondary | ICD-10-CM | POA: Diagnosis not present

## 2022-12-01 DIAGNOSIS — N186 End stage renal disease: Secondary | ICD-10-CM | POA: Diagnosis not present

## 2022-12-01 DIAGNOSIS — I120 Hypertensive chronic kidney disease with stage 5 chronic kidney disease or end stage renal disease: Secondary | ICD-10-CM | POA: Diagnosis not present

## 2022-12-01 DIAGNOSIS — Z992 Dependence on renal dialysis: Secondary | ICD-10-CM | POA: Diagnosis not present

## 2022-12-01 DIAGNOSIS — Z8673 Personal history of transient ischemic attack (TIA), and cerebral infarction without residual deficits: Secondary | ICD-10-CM | POA: Diagnosis not present

## 2022-12-01 DIAGNOSIS — Z95818 Presence of other cardiac implants and grafts: Secondary | ICD-10-CM | POA: Diagnosis not present

## 2022-12-02 DIAGNOSIS — Z992 Dependence on renal dialysis: Secondary | ICD-10-CM | POA: Diagnosis not present

## 2022-12-02 DIAGNOSIS — N186 End stage renal disease: Secondary | ICD-10-CM | POA: Diagnosis not present

## 2022-12-03 ENCOUNTER — Ambulatory Visit: Payer: Medicare Other

## 2022-12-03 DIAGNOSIS — Z8673 Personal history of transient ischemic attack (TIA), and cerebral infarction without residual deficits: Secondary | ICD-10-CM | POA: Diagnosis not present

## 2022-12-03 DIAGNOSIS — E785 Hyperlipidemia, unspecified: Secondary | ICD-10-CM | POA: Diagnosis not present

## 2022-12-03 DIAGNOSIS — Z95818 Presence of other cardiac implants and grafts: Secondary | ICD-10-CM | POA: Diagnosis not present

## 2022-12-03 DIAGNOSIS — K219 Gastro-esophageal reflux disease without esophagitis: Secondary | ICD-10-CM

## 2022-12-03 DIAGNOSIS — Z992 Dependence on renal dialysis: Secondary | ICD-10-CM | POA: Diagnosis not present

## 2022-12-03 DIAGNOSIS — Z6833 Body mass index (BMI) 33.0-33.9, adult: Secondary | ICD-10-CM

## 2022-12-03 DIAGNOSIS — F17210 Nicotine dependence, cigarettes, uncomplicated: Secondary | ICD-10-CM | POA: Diagnosis not present

## 2022-12-03 DIAGNOSIS — N186 End stage renal disease: Secondary | ICD-10-CM | POA: Diagnosis not present

## 2022-12-03 DIAGNOSIS — E669 Obesity, unspecified: Secondary | ICD-10-CM

## 2022-12-03 DIAGNOSIS — I120 Hypertensive chronic kidney disease with stage 5 chronic kidney disease or end stage renal disease: Secondary | ICD-10-CM

## 2022-12-04 DIAGNOSIS — E785 Hyperlipidemia, unspecified: Secondary | ICD-10-CM | POA: Diagnosis not present

## 2022-12-04 DIAGNOSIS — F17210 Nicotine dependence, cigarettes, uncomplicated: Secondary | ICD-10-CM | POA: Diagnosis not present

## 2022-12-04 DIAGNOSIS — N186 End stage renal disease: Secondary | ICD-10-CM | POA: Diagnosis not present

## 2022-12-04 DIAGNOSIS — I120 Hypertensive chronic kidney disease with stage 5 chronic kidney disease or end stage renal disease: Secondary | ICD-10-CM | POA: Diagnosis not present

## 2022-12-04 DIAGNOSIS — Z8673 Personal history of transient ischemic attack (TIA), and cerebral infarction without residual deficits: Secondary | ICD-10-CM | POA: Diagnosis not present

## 2022-12-04 DIAGNOSIS — K219 Gastro-esophageal reflux disease without esophagitis: Secondary | ICD-10-CM | POA: Diagnosis not present

## 2022-12-04 DIAGNOSIS — Z992 Dependence on renal dialysis: Secondary | ICD-10-CM | POA: Diagnosis not present

## 2022-12-04 DIAGNOSIS — Z95818 Presence of other cardiac implants and grafts: Secondary | ICD-10-CM | POA: Diagnosis not present

## 2022-12-06 DIAGNOSIS — Z992 Dependence on renal dialysis: Secondary | ICD-10-CM | POA: Diagnosis not present

## 2022-12-06 DIAGNOSIS — N186 End stage renal disease: Secondary | ICD-10-CM | POA: Diagnosis not present

## 2022-12-08 DIAGNOSIS — Z8673 Personal history of transient ischemic attack (TIA), and cerebral infarction without residual deficits: Secondary | ICD-10-CM | POA: Diagnosis not present

## 2022-12-08 DIAGNOSIS — E785 Hyperlipidemia, unspecified: Secondary | ICD-10-CM | POA: Diagnosis not present

## 2022-12-08 DIAGNOSIS — Z95818 Presence of other cardiac implants and grafts: Secondary | ICD-10-CM | POA: Diagnosis not present

## 2022-12-08 DIAGNOSIS — K219 Gastro-esophageal reflux disease without esophagitis: Secondary | ICD-10-CM | POA: Diagnosis not present

## 2022-12-08 DIAGNOSIS — N186 End stage renal disease: Secondary | ICD-10-CM | POA: Diagnosis not present

## 2022-12-08 DIAGNOSIS — I120 Hypertensive chronic kidney disease with stage 5 chronic kidney disease or end stage renal disease: Secondary | ICD-10-CM | POA: Diagnosis not present

## 2022-12-08 DIAGNOSIS — Z992 Dependence on renal dialysis: Secondary | ICD-10-CM | POA: Diagnosis not present

## 2022-12-08 DIAGNOSIS — F17210 Nicotine dependence, cigarettes, uncomplicated: Secondary | ICD-10-CM | POA: Diagnosis not present

## 2022-12-09 DIAGNOSIS — N186 End stage renal disease: Secondary | ICD-10-CM | POA: Diagnosis not present

## 2022-12-09 DIAGNOSIS — Z992 Dependence on renal dialysis: Secondary | ICD-10-CM | POA: Diagnosis not present

## 2022-12-10 DIAGNOSIS — I120 Hypertensive chronic kidney disease with stage 5 chronic kidney disease or end stage renal disease: Secondary | ICD-10-CM | POA: Diagnosis not present

## 2022-12-10 DIAGNOSIS — Z992 Dependence on renal dialysis: Secondary | ICD-10-CM | POA: Diagnosis not present

## 2022-12-10 DIAGNOSIS — N186 End stage renal disease: Secondary | ICD-10-CM | POA: Diagnosis not present

## 2022-12-10 DIAGNOSIS — K219 Gastro-esophageal reflux disease without esophagitis: Secondary | ICD-10-CM | POA: Diagnosis not present

## 2022-12-10 DIAGNOSIS — F17210 Nicotine dependence, cigarettes, uncomplicated: Secondary | ICD-10-CM | POA: Diagnosis not present

## 2022-12-10 DIAGNOSIS — E785 Hyperlipidemia, unspecified: Secondary | ICD-10-CM | POA: Diagnosis not present

## 2022-12-10 DIAGNOSIS — Z95818 Presence of other cardiac implants and grafts: Secondary | ICD-10-CM | POA: Diagnosis not present

## 2022-12-10 DIAGNOSIS — Z8673 Personal history of transient ischemic attack (TIA), and cerebral infarction without residual deficits: Secondary | ICD-10-CM | POA: Diagnosis not present

## 2022-12-11 DIAGNOSIS — Z992 Dependence on renal dialysis: Secondary | ICD-10-CM | POA: Diagnosis not present

## 2022-12-11 DIAGNOSIS — N186 End stage renal disease: Secondary | ICD-10-CM | POA: Diagnosis not present

## 2022-12-13 DIAGNOSIS — N186 End stage renal disease: Secondary | ICD-10-CM | POA: Diagnosis not present

## 2022-12-13 DIAGNOSIS — Z992 Dependence on renal dialysis: Secondary | ICD-10-CM | POA: Diagnosis not present

## 2022-12-16 DIAGNOSIS — N186 End stage renal disease: Secondary | ICD-10-CM | POA: Diagnosis not present

## 2022-12-16 DIAGNOSIS — Z992 Dependence on renal dialysis: Secondary | ICD-10-CM | POA: Diagnosis not present

## 2022-12-17 DIAGNOSIS — Z8673 Personal history of transient ischemic attack (TIA), and cerebral infarction without residual deficits: Secondary | ICD-10-CM | POA: Diagnosis not present

## 2022-12-17 DIAGNOSIS — E785 Hyperlipidemia, unspecified: Secondary | ICD-10-CM | POA: Diagnosis not present

## 2022-12-17 DIAGNOSIS — I120 Hypertensive chronic kidney disease with stage 5 chronic kidney disease or end stage renal disease: Secondary | ICD-10-CM | POA: Diagnosis not present

## 2022-12-17 DIAGNOSIS — N186 End stage renal disease: Secondary | ICD-10-CM | POA: Diagnosis not present

## 2022-12-17 DIAGNOSIS — Z992 Dependence on renal dialysis: Secondary | ICD-10-CM | POA: Diagnosis not present

## 2022-12-17 DIAGNOSIS — Z95818 Presence of other cardiac implants and grafts: Secondary | ICD-10-CM | POA: Diagnosis not present

## 2022-12-17 DIAGNOSIS — F17210 Nicotine dependence, cigarettes, uncomplicated: Secondary | ICD-10-CM | POA: Diagnosis not present

## 2022-12-17 DIAGNOSIS — K219 Gastro-esophageal reflux disease without esophagitis: Secondary | ICD-10-CM | POA: Diagnosis not present

## 2022-12-18 ENCOUNTER — Other Ambulatory Visit: Payer: Self-pay | Admitting: Family Medicine

## 2022-12-18 DIAGNOSIS — Z992 Dependence on renal dialysis: Secondary | ICD-10-CM | POA: Diagnosis not present

## 2022-12-18 DIAGNOSIS — N186 End stage renal disease: Secondary | ICD-10-CM | POA: Diagnosis not present

## 2022-12-19 DIAGNOSIS — Z992 Dependence on renal dialysis: Secondary | ICD-10-CM | POA: Diagnosis not present

## 2022-12-19 DIAGNOSIS — Z8673 Personal history of transient ischemic attack (TIA), and cerebral infarction without residual deficits: Secondary | ICD-10-CM | POA: Diagnosis not present

## 2022-12-19 DIAGNOSIS — I10 Essential (primary) hypertension: Secondary | ICD-10-CM | POA: Diagnosis not present

## 2022-12-19 DIAGNOSIS — N186 End stage renal disease: Secondary | ICD-10-CM | POA: Diagnosis not present

## 2022-12-19 DIAGNOSIS — F17200 Nicotine dependence, unspecified, uncomplicated: Secondary | ICD-10-CM | POA: Diagnosis not present

## 2022-12-19 DIAGNOSIS — E785 Hyperlipidemia, unspecified: Secondary | ICD-10-CM | POA: Diagnosis not present

## 2022-12-19 DIAGNOSIS — Z95818 Presence of other cardiac implants and grafts: Secondary | ICD-10-CM | POA: Diagnosis not present

## 2022-12-19 DIAGNOSIS — I120 Hypertensive chronic kidney disease with stage 5 chronic kidney disease or end stage renal disease: Secondary | ICD-10-CM | POA: Diagnosis not present

## 2022-12-19 DIAGNOSIS — K219 Gastro-esophageal reflux disease without esophagitis: Secondary | ICD-10-CM | POA: Diagnosis not present

## 2022-12-19 DIAGNOSIS — F17210 Nicotine dependence, cigarettes, uncomplicated: Secondary | ICD-10-CM | POA: Diagnosis not present

## 2022-12-20 DIAGNOSIS — N186 End stage renal disease: Secondary | ICD-10-CM | POA: Diagnosis not present

## 2022-12-20 DIAGNOSIS — Z992 Dependence on renal dialysis: Secondary | ICD-10-CM | POA: Diagnosis not present

## 2022-12-22 DIAGNOSIS — F17210 Nicotine dependence, cigarettes, uncomplicated: Secondary | ICD-10-CM | POA: Diagnosis not present

## 2022-12-22 DIAGNOSIS — I120 Hypertensive chronic kidney disease with stage 5 chronic kidney disease or end stage renal disease: Secondary | ICD-10-CM | POA: Diagnosis not present

## 2022-12-22 DIAGNOSIS — Z992 Dependence on renal dialysis: Secondary | ICD-10-CM | POA: Diagnosis not present

## 2022-12-22 DIAGNOSIS — K219 Gastro-esophageal reflux disease without esophagitis: Secondary | ICD-10-CM | POA: Diagnosis not present

## 2022-12-22 DIAGNOSIS — E785 Hyperlipidemia, unspecified: Secondary | ICD-10-CM | POA: Diagnosis not present

## 2022-12-22 DIAGNOSIS — N186 End stage renal disease: Secondary | ICD-10-CM | POA: Diagnosis not present

## 2022-12-22 DIAGNOSIS — Z8673 Personal history of transient ischemic attack (TIA), and cerebral infarction without residual deficits: Secondary | ICD-10-CM | POA: Diagnosis not present

## 2022-12-22 DIAGNOSIS — Z95818 Presence of other cardiac implants and grafts: Secondary | ICD-10-CM | POA: Diagnosis not present

## 2022-12-23 DIAGNOSIS — Z992 Dependence on renal dialysis: Secondary | ICD-10-CM | POA: Diagnosis not present

## 2022-12-23 DIAGNOSIS — N186 End stage renal disease: Secondary | ICD-10-CM | POA: Diagnosis not present

## 2022-12-25 DIAGNOSIS — Z992 Dependence on renal dialysis: Secondary | ICD-10-CM | POA: Diagnosis not present

## 2022-12-25 DIAGNOSIS — N186 End stage renal disease: Secondary | ICD-10-CM | POA: Diagnosis not present

## 2022-12-26 DIAGNOSIS — N186 End stage renal disease: Secondary | ICD-10-CM | POA: Diagnosis not present

## 2022-12-27 ENCOUNTER — Other Ambulatory Visit: Payer: Self-pay | Admitting: Family Medicine

## 2022-12-27 DIAGNOSIS — K219 Gastro-esophageal reflux disease without esophagitis: Secondary | ICD-10-CM

## 2022-12-27 DIAGNOSIS — Z992 Dependence on renal dialysis: Secondary | ICD-10-CM | POA: Diagnosis not present

## 2022-12-27 DIAGNOSIS — N186 End stage renal disease: Secondary | ICD-10-CM | POA: Diagnosis not present

## 2022-12-29 DIAGNOSIS — Z8673 Personal history of transient ischemic attack (TIA), and cerebral infarction without residual deficits: Secondary | ICD-10-CM | POA: Diagnosis not present

## 2022-12-29 DIAGNOSIS — Z992 Dependence on renal dialysis: Secondary | ICD-10-CM | POA: Diagnosis not present

## 2022-12-29 DIAGNOSIS — Z95818 Presence of other cardiac implants and grafts: Secondary | ICD-10-CM | POA: Diagnosis not present

## 2022-12-29 DIAGNOSIS — E785 Hyperlipidemia, unspecified: Secondary | ICD-10-CM | POA: Diagnosis not present

## 2022-12-29 DIAGNOSIS — N186 End stage renal disease: Secondary | ICD-10-CM | POA: Diagnosis not present

## 2022-12-29 DIAGNOSIS — I120 Hypertensive chronic kidney disease with stage 5 chronic kidney disease or end stage renal disease: Secondary | ICD-10-CM | POA: Diagnosis not present

## 2022-12-29 DIAGNOSIS — K219 Gastro-esophageal reflux disease without esophagitis: Secondary | ICD-10-CM | POA: Diagnosis not present

## 2022-12-29 DIAGNOSIS — F17210 Nicotine dependence, cigarettes, uncomplicated: Secondary | ICD-10-CM | POA: Diagnosis not present

## 2022-12-30 ENCOUNTER — Ambulatory Visit: Payer: Medicare Other | Admitting: Internal Medicine

## 2022-12-30 DIAGNOSIS — N186 End stage renal disease: Secondary | ICD-10-CM | POA: Diagnosis not present

## 2022-12-30 DIAGNOSIS — Z992 Dependence on renal dialysis: Secondary | ICD-10-CM | POA: Diagnosis not present

## 2023-01-01 DIAGNOSIS — N186 End stage renal disease: Secondary | ICD-10-CM | POA: Diagnosis not present

## 2023-01-01 DIAGNOSIS — Z992 Dependence on renal dialysis: Secondary | ICD-10-CM | POA: Diagnosis not present

## 2023-01-03 DIAGNOSIS — N186 End stage renal disease: Secondary | ICD-10-CM | POA: Diagnosis not present

## 2023-01-03 DIAGNOSIS — Z992 Dependence on renal dialysis: Secondary | ICD-10-CM | POA: Diagnosis not present

## 2023-01-06 DIAGNOSIS — Z992 Dependence on renal dialysis: Secondary | ICD-10-CM | POA: Diagnosis not present

## 2023-01-06 DIAGNOSIS — N186 End stage renal disease: Secondary | ICD-10-CM | POA: Diagnosis not present

## 2023-01-07 DIAGNOSIS — N186 End stage renal disease: Secondary | ICD-10-CM | POA: Diagnosis not present

## 2023-01-07 DIAGNOSIS — Z8673 Personal history of transient ischemic attack (TIA), and cerebral infarction without residual deficits: Secondary | ICD-10-CM | POA: Diagnosis not present

## 2023-01-07 DIAGNOSIS — Z95818 Presence of other cardiac implants and grafts: Secondary | ICD-10-CM | POA: Diagnosis not present

## 2023-01-07 DIAGNOSIS — Z992 Dependence on renal dialysis: Secondary | ICD-10-CM | POA: Diagnosis not present

## 2023-01-07 DIAGNOSIS — E785 Hyperlipidemia, unspecified: Secondary | ICD-10-CM | POA: Diagnosis not present

## 2023-01-07 DIAGNOSIS — I120 Hypertensive chronic kidney disease with stage 5 chronic kidney disease or end stage renal disease: Secondary | ICD-10-CM | POA: Diagnosis not present

## 2023-01-07 DIAGNOSIS — K219 Gastro-esophageal reflux disease without esophagitis: Secondary | ICD-10-CM | POA: Diagnosis not present

## 2023-01-07 DIAGNOSIS — F17210 Nicotine dependence, cigarettes, uncomplicated: Secondary | ICD-10-CM | POA: Diagnosis not present

## 2023-01-08 ENCOUNTER — Other Ambulatory Visit: Payer: Self-pay | Admitting: Family Medicine

## 2023-01-08 DIAGNOSIS — N186 End stage renal disease: Secondary | ICD-10-CM | POA: Diagnosis not present

## 2023-01-08 DIAGNOSIS — Z992 Dependence on renal dialysis: Secondary | ICD-10-CM | POA: Diagnosis not present

## 2023-01-08 DIAGNOSIS — E782 Mixed hyperlipidemia: Secondary | ICD-10-CM

## 2023-01-10 DIAGNOSIS — N186 End stage renal disease: Secondary | ICD-10-CM | POA: Diagnosis not present

## 2023-01-10 DIAGNOSIS — Z992 Dependence on renal dialysis: Secondary | ICD-10-CM | POA: Diagnosis not present

## 2023-01-13 DIAGNOSIS — N186 End stage renal disease: Secondary | ICD-10-CM | POA: Diagnosis not present

## 2023-01-13 DIAGNOSIS — Z992 Dependence on renal dialysis: Secondary | ICD-10-CM | POA: Diagnosis not present

## 2023-01-15 DIAGNOSIS — Z992 Dependence on renal dialysis: Secondary | ICD-10-CM | POA: Diagnosis not present

## 2023-01-15 DIAGNOSIS — N186 End stage renal disease: Secondary | ICD-10-CM | POA: Diagnosis not present

## 2023-01-17 DIAGNOSIS — N186 End stage renal disease: Secondary | ICD-10-CM | POA: Diagnosis not present

## 2023-01-17 DIAGNOSIS — Z992 Dependence on renal dialysis: Secondary | ICD-10-CM | POA: Diagnosis not present

## 2023-01-20 DIAGNOSIS — Z992 Dependence on renal dialysis: Secondary | ICD-10-CM | POA: Diagnosis not present

## 2023-01-20 DIAGNOSIS — N186 End stage renal disease: Secondary | ICD-10-CM | POA: Diagnosis not present

## 2023-01-22 DIAGNOSIS — Z992 Dependence on renal dialysis: Secondary | ICD-10-CM | POA: Diagnosis not present

## 2023-01-22 DIAGNOSIS — N186 End stage renal disease: Secondary | ICD-10-CM | POA: Diagnosis not present

## 2023-01-24 ENCOUNTER — Other Ambulatory Visit: Payer: Self-pay | Admitting: Family Medicine

## 2023-01-24 DIAGNOSIS — K219 Gastro-esophageal reflux disease without esophagitis: Secondary | ICD-10-CM

## 2023-01-24 DIAGNOSIS — N186 End stage renal disease: Secondary | ICD-10-CM | POA: Diagnosis not present

## 2023-01-24 DIAGNOSIS — Z992 Dependence on renal dialysis: Secondary | ICD-10-CM | POA: Diagnosis not present

## 2023-01-25 DIAGNOSIS — N186 End stage renal disease: Secondary | ICD-10-CM | POA: Diagnosis not present

## 2023-01-26 ENCOUNTER — Encounter: Payer: Self-pay | Admitting: Family Medicine

## 2023-01-26 NOTE — Telephone Encounter (Signed)
Lori Ortiz pt NTBS 30-d given 01/02/23

## 2023-01-26 NOTE — Telephone Encounter (Signed)
LMTCB to schedule appt Letter mailed 

## 2023-01-27 DIAGNOSIS — N186 End stage renal disease: Secondary | ICD-10-CM | POA: Diagnosis not present

## 2023-01-27 DIAGNOSIS — Z992 Dependence on renal dialysis: Secondary | ICD-10-CM | POA: Diagnosis not present

## 2023-01-28 ENCOUNTER — Other Ambulatory Visit: Payer: Self-pay | Admitting: Family Medicine

## 2023-01-28 DIAGNOSIS — K219 Gastro-esophageal reflux disease without esophagitis: Secondary | ICD-10-CM

## 2023-01-28 NOTE — Telephone Encounter (Signed)
Tiffany pt NTBS 30-d given 01/02/23

## 2023-01-28 NOTE — Telephone Encounter (Signed)
Pt is in [place] and does not know when she will come back to [place], asked pt if she has a primary care provider in [place]., she sated she does and I advised pt to request refill from her PCP in [place].

## 2023-01-29 DIAGNOSIS — N186 End stage renal disease: Secondary | ICD-10-CM | POA: Diagnosis not present

## 2023-01-29 DIAGNOSIS — Z992 Dependence on renal dialysis: Secondary | ICD-10-CM | POA: Diagnosis not present

## 2023-01-31 DIAGNOSIS — N186 End stage renal disease: Secondary | ICD-10-CM | POA: Diagnosis not present

## 2023-01-31 DIAGNOSIS — Z992 Dependence on renal dialysis: Secondary | ICD-10-CM | POA: Diagnosis not present

## 2023-02-03 DIAGNOSIS — Z992 Dependence on renal dialysis: Secondary | ICD-10-CM | POA: Diagnosis not present

## 2023-02-03 DIAGNOSIS — F17200 Nicotine dependence, unspecified, uncomplicated: Secondary | ICD-10-CM | POA: Diagnosis not present

## 2023-02-03 DIAGNOSIS — N186 End stage renal disease: Secondary | ICD-10-CM | POA: Diagnosis not present

## 2023-02-05 DIAGNOSIS — Z992 Dependence on renal dialysis: Secondary | ICD-10-CM | POA: Diagnosis not present

## 2023-02-05 DIAGNOSIS — N186 End stage renal disease: Secondary | ICD-10-CM | POA: Diagnosis not present

## 2023-02-07 DIAGNOSIS — Z992 Dependence on renal dialysis: Secondary | ICD-10-CM | POA: Diagnosis not present

## 2023-02-07 DIAGNOSIS — N186 End stage renal disease: Secondary | ICD-10-CM | POA: Diagnosis not present

## 2023-02-10 DIAGNOSIS — Z992 Dependence on renal dialysis: Secondary | ICD-10-CM | POA: Diagnosis not present

## 2023-02-10 DIAGNOSIS — Z131 Encounter for screening for diabetes mellitus: Secondary | ICD-10-CM | POA: Diagnosis not present

## 2023-02-10 DIAGNOSIS — N186 End stage renal disease: Secondary | ICD-10-CM | POA: Diagnosis not present

## 2023-02-12 ENCOUNTER — Other Ambulatory Visit: Payer: Self-pay | Admitting: Family Medicine

## 2023-02-12 DIAGNOSIS — E782 Mixed hyperlipidemia: Secondary | ICD-10-CM | POA: Diagnosis not present

## 2023-02-12 DIAGNOSIS — N186 End stage renal disease: Secondary | ICD-10-CM | POA: Diagnosis not present

## 2023-02-12 DIAGNOSIS — I10 Essential (primary) hypertension: Secondary | ICD-10-CM | POA: Diagnosis not present

## 2023-02-12 DIAGNOSIS — Z992 Dependence on renal dialysis: Secondary | ICD-10-CM | POA: Diagnosis not present

## 2023-02-12 DIAGNOSIS — K219 Gastro-esophageal reflux disease without esophagitis: Secondary | ICD-10-CM | POA: Diagnosis not present

## 2023-02-12 DIAGNOSIS — Z0000 Encounter for general adult medical examination without abnormal findings: Secondary | ICD-10-CM | POA: Diagnosis not present

## 2023-02-13 NOTE — Telephone Encounter (Signed)
Lori Ortiz pt NTBS 30-d given 01/08/23

## 2023-02-13 NOTE — Telephone Encounter (Signed)
I called pt & she is in [place] right now. She isn't coming back home until month or so later. She is aware that she will not be able to get any refills until she is seen by TM here.

## 2023-02-14 DIAGNOSIS — Z992 Dependence on renal dialysis: Secondary | ICD-10-CM | POA: Diagnosis not present

## 2023-02-14 DIAGNOSIS — N186 End stage renal disease: Secondary | ICD-10-CM | POA: Diagnosis not present

## 2023-02-16 DIAGNOSIS — Z992 Dependence on renal dialysis: Secondary | ICD-10-CM | POA: Diagnosis not present

## 2023-02-16 DIAGNOSIS — N186 End stage renal disease: Secondary | ICD-10-CM | POA: Diagnosis not present

## 2023-02-19 DIAGNOSIS — N186 End stage renal disease: Secondary | ICD-10-CM | POA: Diagnosis not present

## 2023-02-19 DIAGNOSIS — Z992 Dependence on renal dialysis: Secondary | ICD-10-CM | POA: Diagnosis not present

## 2023-02-21 DIAGNOSIS — N186 End stage renal disease: Secondary | ICD-10-CM | POA: Diagnosis not present

## 2023-02-21 DIAGNOSIS — Z992 Dependence on renal dialysis: Secondary | ICD-10-CM | POA: Diagnosis not present

## 2023-02-24 DIAGNOSIS — Z992 Dependence on renal dialysis: Secondary | ICD-10-CM | POA: Diagnosis not present

## 2023-02-24 DIAGNOSIS — N186 End stage renal disease: Secondary | ICD-10-CM | POA: Diagnosis not present

## 2023-02-25 DIAGNOSIS — N186 End stage renal disease: Secondary | ICD-10-CM | POA: Diagnosis not present

## 2023-02-26 DIAGNOSIS — Z992 Dependence on renal dialysis: Secondary | ICD-10-CM | POA: Diagnosis not present

## 2023-02-26 DIAGNOSIS — N186 End stage renal disease: Secondary | ICD-10-CM | POA: Diagnosis not present

## 2023-02-28 DIAGNOSIS — Z992 Dependence on renal dialysis: Secondary | ICD-10-CM | POA: Diagnosis not present

## 2023-02-28 DIAGNOSIS — N186 End stage renal disease: Secondary | ICD-10-CM | POA: Diagnosis not present

## 2023-03-03 DIAGNOSIS — N186 End stage renal disease: Secondary | ICD-10-CM | POA: Diagnosis not present

## 2023-03-03 DIAGNOSIS — Z992 Dependence on renal dialysis: Secondary | ICD-10-CM | POA: Diagnosis not present

## 2023-03-04 ENCOUNTER — Other Ambulatory Visit: Payer: Self-pay | Admitting: Nephrology

## 2023-03-05 DIAGNOSIS — N186 End stage renal disease: Secondary | ICD-10-CM | POA: Diagnosis not present

## 2023-03-05 DIAGNOSIS — Z992 Dependence on renal dialysis: Secondary | ICD-10-CM | POA: Diagnosis not present

## 2023-03-07 DIAGNOSIS — N186 End stage renal disease: Secondary | ICD-10-CM | POA: Diagnosis not present

## 2023-03-07 DIAGNOSIS — Z992 Dependence on renal dialysis: Secondary | ICD-10-CM | POA: Diagnosis not present

## 2023-03-10 DIAGNOSIS — N186 End stage renal disease: Secondary | ICD-10-CM | POA: Diagnosis not present

## 2023-03-10 DIAGNOSIS — Z992 Dependence on renal dialysis: Secondary | ICD-10-CM | POA: Diagnosis not present

## 2023-03-12 DIAGNOSIS — N186 End stage renal disease: Secondary | ICD-10-CM | POA: Diagnosis not present

## 2023-03-12 DIAGNOSIS — Z992 Dependence on renal dialysis: Secondary | ICD-10-CM | POA: Diagnosis not present

## 2023-03-14 DIAGNOSIS — Z992 Dependence on renal dialysis: Secondary | ICD-10-CM | POA: Diagnosis not present

## 2023-03-14 DIAGNOSIS — N186 End stage renal disease: Secondary | ICD-10-CM | POA: Diagnosis not present

## 2023-03-17 DIAGNOSIS — Z992 Dependence on renal dialysis: Secondary | ICD-10-CM | POA: Diagnosis not present

## 2023-03-17 DIAGNOSIS — N186 End stage renal disease: Secondary | ICD-10-CM | POA: Diagnosis not present

## 2023-03-19 DIAGNOSIS — Z992 Dependence on renal dialysis: Secondary | ICD-10-CM | POA: Diagnosis not present

## 2023-03-19 DIAGNOSIS — N186 End stage renal disease: Secondary | ICD-10-CM | POA: Diagnosis not present

## 2023-03-20 DIAGNOSIS — H2513 Age-related nuclear cataract, bilateral: Secondary | ICD-10-CM | POA: Diagnosis not present

## 2023-03-21 DIAGNOSIS — Z992 Dependence on renal dialysis: Secondary | ICD-10-CM | POA: Diagnosis not present

## 2023-03-21 DIAGNOSIS — N186 End stage renal disease: Secondary | ICD-10-CM | POA: Diagnosis not present

## 2023-03-23 DIAGNOSIS — Z992 Dependence on renal dialysis: Secondary | ICD-10-CM | POA: Diagnosis not present

## 2023-03-23 DIAGNOSIS — F17200 Nicotine dependence, unspecified, uncomplicated: Secondary | ICD-10-CM | POA: Diagnosis not present

## 2023-03-23 DIAGNOSIS — N186 End stage renal disease: Secondary | ICD-10-CM | POA: Diagnosis not present

## 2023-03-24 DIAGNOSIS — N186 End stage renal disease: Secondary | ICD-10-CM | POA: Diagnosis not present

## 2023-03-24 DIAGNOSIS — Z992 Dependence on renal dialysis: Secondary | ICD-10-CM | POA: Diagnosis not present

## 2023-03-26 DIAGNOSIS — Z992 Dependence on renal dialysis: Secondary | ICD-10-CM | POA: Diagnosis not present

## 2023-03-26 DIAGNOSIS — N186 End stage renal disease: Secondary | ICD-10-CM | POA: Diagnosis not present

## 2023-03-27 DIAGNOSIS — R918 Other nonspecific abnormal finding of lung field: Secondary | ICD-10-CM | POA: Diagnosis not present

## 2023-03-27 DIAGNOSIS — R051 Acute cough: Secondary | ICD-10-CM | POA: Diagnosis not present

## 2023-03-27 DIAGNOSIS — J209 Acute bronchitis, unspecified: Secondary | ICD-10-CM | POA: Diagnosis not present

## 2023-03-27 DIAGNOSIS — N186 End stage renal disease: Secondary | ICD-10-CM | POA: Diagnosis not present

## 2023-03-28 DIAGNOSIS — N186 End stage renal disease: Secondary | ICD-10-CM | POA: Diagnosis not present

## 2023-03-28 DIAGNOSIS — Z992 Dependence on renal dialysis: Secondary | ICD-10-CM | POA: Diagnosis not present

## 2023-03-31 DIAGNOSIS — Z992 Dependence on renal dialysis: Secondary | ICD-10-CM | POA: Diagnosis not present

## 2023-03-31 DIAGNOSIS — N186 End stage renal disease: Secondary | ICD-10-CM | POA: Diagnosis not present

## 2023-04-02 DIAGNOSIS — N186 End stage renal disease: Secondary | ICD-10-CM | POA: Diagnosis not present

## 2023-04-02 DIAGNOSIS — Z992 Dependence on renal dialysis: Secondary | ICD-10-CM | POA: Diagnosis not present

## 2023-04-04 DIAGNOSIS — Z992 Dependence on renal dialysis: Secondary | ICD-10-CM | POA: Diagnosis not present

## 2023-04-04 DIAGNOSIS — N186 End stage renal disease: Secondary | ICD-10-CM | POA: Diagnosis not present

## 2023-04-07 DIAGNOSIS — N186 End stage renal disease: Secondary | ICD-10-CM | POA: Diagnosis not present

## 2023-04-07 DIAGNOSIS — Z992 Dependence on renal dialysis: Secondary | ICD-10-CM | POA: Diagnosis not present

## 2023-04-09 DIAGNOSIS — Z992 Dependence on renal dialysis: Secondary | ICD-10-CM | POA: Diagnosis not present

## 2023-04-09 DIAGNOSIS — N186 End stage renal disease: Secondary | ICD-10-CM | POA: Diagnosis not present

## 2023-04-11 DIAGNOSIS — Z992 Dependence on renal dialysis: Secondary | ICD-10-CM | POA: Diagnosis not present

## 2023-04-11 DIAGNOSIS — N186 End stage renal disease: Secondary | ICD-10-CM | POA: Diagnosis not present

## 2023-04-12 DIAGNOSIS — Z992 Dependence on renal dialysis: Secondary | ICD-10-CM | POA: Diagnosis not present

## 2023-04-12 DIAGNOSIS — N186 End stage renal disease: Secondary | ICD-10-CM | POA: Diagnosis not present

## 2023-04-14 DIAGNOSIS — N186 End stage renal disease: Secondary | ICD-10-CM | POA: Diagnosis not present

## 2023-04-14 DIAGNOSIS — Z992 Dependence on renal dialysis: Secondary | ICD-10-CM | POA: Diagnosis not present

## 2023-04-16 DIAGNOSIS — N186 End stage renal disease: Secondary | ICD-10-CM | POA: Diagnosis not present

## 2023-04-16 DIAGNOSIS — Z992 Dependence on renal dialysis: Secondary | ICD-10-CM | POA: Diagnosis not present

## 2023-04-18 DIAGNOSIS — N186 End stage renal disease: Secondary | ICD-10-CM | POA: Diagnosis not present

## 2023-04-18 DIAGNOSIS — Z992 Dependence on renal dialysis: Secondary | ICD-10-CM | POA: Diagnosis not present

## 2023-04-21 DIAGNOSIS — Z992 Dependence on renal dialysis: Secondary | ICD-10-CM | POA: Diagnosis not present

## 2023-04-21 DIAGNOSIS — N186 End stage renal disease: Secondary | ICD-10-CM | POA: Diagnosis not present

## 2023-04-23 DIAGNOSIS — Z992 Dependence on renal dialysis: Secondary | ICD-10-CM | POA: Diagnosis not present

## 2023-04-23 DIAGNOSIS — N186 End stage renal disease: Secondary | ICD-10-CM | POA: Diagnosis not present

## 2023-04-25 DIAGNOSIS — Z992 Dependence on renal dialysis: Secondary | ICD-10-CM | POA: Diagnosis not present

## 2023-04-25 DIAGNOSIS — N186 End stage renal disease: Secondary | ICD-10-CM | POA: Diagnosis not present

## 2023-04-27 DIAGNOSIS — H2522 Age-related cataract, morgagnian type, left eye: Secondary | ICD-10-CM | POA: Diagnosis not present

## 2023-04-27 DIAGNOSIS — H2511 Age-related nuclear cataract, right eye: Secondary | ICD-10-CM | POA: Diagnosis not present

## 2023-04-28 DIAGNOSIS — Z992 Dependence on renal dialysis: Secondary | ICD-10-CM | POA: Diagnosis not present

## 2023-04-28 DIAGNOSIS — N186 End stage renal disease: Secondary | ICD-10-CM | POA: Diagnosis not present

## 2023-04-30 DIAGNOSIS — N186 End stage renal disease: Secondary | ICD-10-CM | POA: Diagnosis not present

## 2023-04-30 DIAGNOSIS — Z992 Dependence on renal dialysis: Secondary | ICD-10-CM | POA: Diagnosis not present

## 2023-05-02 DIAGNOSIS — N186 End stage renal disease: Secondary | ICD-10-CM | POA: Diagnosis not present

## 2023-05-02 DIAGNOSIS — Z992 Dependence on renal dialysis: Secondary | ICD-10-CM | POA: Diagnosis not present

## 2023-05-05 DIAGNOSIS — Z992 Dependence on renal dialysis: Secondary | ICD-10-CM | POA: Diagnosis not present

## 2023-05-05 DIAGNOSIS — N186 End stage renal disease: Secondary | ICD-10-CM | POA: Diagnosis not present

## 2023-05-07 DIAGNOSIS — Z992 Dependence on renal dialysis: Secondary | ICD-10-CM | POA: Diagnosis not present

## 2023-05-07 DIAGNOSIS — N186 End stage renal disease: Secondary | ICD-10-CM | POA: Diagnosis not present

## 2023-05-09 DIAGNOSIS — N186 End stage renal disease: Secondary | ICD-10-CM | POA: Diagnosis not present

## 2023-05-09 DIAGNOSIS — Z992 Dependence on renal dialysis: Secondary | ICD-10-CM | POA: Diagnosis not present

## 2023-05-12 DIAGNOSIS — Z131 Encounter for screening for diabetes mellitus: Secondary | ICD-10-CM | POA: Diagnosis not present

## 2023-05-12 DIAGNOSIS — Z992 Dependence on renal dialysis: Secondary | ICD-10-CM | POA: Diagnosis not present

## 2023-05-12 DIAGNOSIS — N186 End stage renal disease: Secondary | ICD-10-CM | POA: Diagnosis not present

## 2023-05-14 DIAGNOSIS — Z992 Dependence on renal dialysis: Secondary | ICD-10-CM | POA: Diagnosis not present

## 2023-05-14 DIAGNOSIS — N186 End stage renal disease: Secondary | ICD-10-CM | POA: Diagnosis not present

## 2023-05-16 DIAGNOSIS — Z992 Dependence on renal dialysis: Secondary | ICD-10-CM | POA: Diagnosis not present

## 2023-05-16 DIAGNOSIS — N186 End stage renal disease: Secondary | ICD-10-CM | POA: Diagnosis not present

## 2023-05-19 DIAGNOSIS — N186 End stage renal disease: Secondary | ICD-10-CM | POA: Diagnosis not present

## 2023-05-19 DIAGNOSIS — Z992 Dependence on renal dialysis: Secondary | ICD-10-CM | POA: Diagnosis not present

## 2023-05-21 DIAGNOSIS — Z992 Dependence on renal dialysis: Secondary | ICD-10-CM | POA: Diagnosis not present

## 2023-05-21 DIAGNOSIS — N186 End stage renal disease: Secondary | ICD-10-CM | POA: Diagnosis not present

## 2023-05-23 DIAGNOSIS — N186 End stage renal disease: Secondary | ICD-10-CM | POA: Diagnosis not present

## 2023-05-23 DIAGNOSIS — Z992 Dependence on renal dialysis: Secondary | ICD-10-CM | POA: Diagnosis not present

## 2023-05-26 DIAGNOSIS — Z992 Dependence on renal dialysis: Secondary | ICD-10-CM | POA: Diagnosis not present

## 2023-05-26 DIAGNOSIS — N186 End stage renal disease: Secondary | ICD-10-CM | POA: Diagnosis not present

## 2023-05-27 DIAGNOSIS — H9203 Otalgia, bilateral: Secondary | ICD-10-CM | POA: Diagnosis not present

## 2023-05-27 DIAGNOSIS — H6503 Acute serous otitis media, bilateral: Secondary | ICD-10-CM | POA: Diagnosis not present

## 2023-05-28 DIAGNOSIS — N186 End stage renal disease: Secondary | ICD-10-CM | POA: Diagnosis not present

## 2023-05-28 DIAGNOSIS — Z992 Dependence on renal dialysis: Secondary | ICD-10-CM | POA: Diagnosis not present

## 2023-05-30 DIAGNOSIS — Z992 Dependence on renal dialysis: Secondary | ICD-10-CM | POA: Diagnosis not present

## 2023-05-30 DIAGNOSIS — N186 End stage renal disease: Secondary | ICD-10-CM | POA: Diagnosis not present

## 2023-06-01 DIAGNOSIS — T82590D Other mechanical complication of surgically created arteriovenous fistula, subsequent encounter: Secondary | ICD-10-CM | POA: Diagnosis not present

## 2023-06-02 DIAGNOSIS — Z992 Dependence on renal dialysis: Secondary | ICD-10-CM | POA: Diagnosis not present

## 2023-06-02 DIAGNOSIS — N186 End stage renal disease: Secondary | ICD-10-CM | POA: Diagnosis not present

## 2023-06-03 DIAGNOSIS — I10 Essential (primary) hypertension: Secondary | ICD-10-CM | POA: Diagnosis not present

## 2023-06-03 DIAGNOSIS — H2512 Age-related nuclear cataract, left eye: Secondary | ICD-10-CM | POA: Diagnosis not present

## 2023-06-03 DIAGNOSIS — H2589 Other age-related cataract: Secondary | ICD-10-CM | POA: Diagnosis not present

## 2023-06-04 DIAGNOSIS — Z992 Dependence on renal dialysis: Secondary | ICD-10-CM | POA: Diagnosis not present

## 2023-06-04 DIAGNOSIS — N186 End stage renal disease: Secondary | ICD-10-CM | POA: Diagnosis not present

## 2023-06-06 DIAGNOSIS — N186 End stage renal disease: Secondary | ICD-10-CM | POA: Diagnosis not present

## 2023-06-06 DIAGNOSIS — Z992 Dependence on renal dialysis: Secondary | ICD-10-CM | POA: Diagnosis not present

## 2023-06-08 DIAGNOSIS — H6691 Otitis media, unspecified, right ear: Secondary | ICD-10-CM | POA: Diagnosis not present

## 2023-06-08 DIAGNOSIS — T82590A Other mechanical complication of surgically created arteriovenous fistula, initial encounter: Secondary | ICD-10-CM | POA: Diagnosis not present

## 2023-06-09 DIAGNOSIS — N186 End stage renal disease: Secondary | ICD-10-CM | POA: Diagnosis not present

## 2023-06-09 DIAGNOSIS — Z992 Dependence on renal dialysis: Secondary | ICD-10-CM | POA: Diagnosis not present

## 2023-06-11 DIAGNOSIS — N186 End stage renal disease: Secondary | ICD-10-CM | POA: Diagnosis not present

## 2023-06-11 DIAGNOSIS — Z992 Dependence on renal dialysis: Secondary | ICD-10-CM | POA: Diagnosis not present

## 2023-06-13 DIAGNOSIS — N186 End stage renal disease: Secondary | ICD-10-CM | POA: Diagnosis not present

## 2023-06-13 DIAGNOSIS — Z992 Dependence on renal dialysis: Secondary | ICD-10-CM | POA: Diagnosis not present

## 2023-06-16 DIAGNOSIS — Z992 Dependence on renal dialysis: Secondary | ICD-10-CM | POA: Diagnosis not present

## 2023-06-16 DIAGNOSIS — N186 End stage renal disease: Secondary | ICD-10-CM | POA: Diagnosis not present

## 2023-06-18 DIAGNOSIS — Z992 Dependence on renal dialysis: Secondary | ICD-10-CM | POA: Diagnosis not present

## 2023-06-18 DIAGNOSIS — N186 End stage renal disease: Secondary | ICD-10-CM | POA: Diagnosis not present

## 2023-06-20 DIAGNOSIS — N186 End stage renal disease: Secondary | ICD-10-CM | POA: Diagnosis not present

## 2023-06-20 DIAGNOSIS — Z992 Dependence on renal dialysis: Secondary | ICD-10-CM | POA: Diagnosis not present

## 2023-06-23 DIAGNOSIS — Z992 Dependence on renal dialysis: Secondary | ICD-10-CM | POA: Diagnosis not present

## 2023-06-23 DIAGNOSIS — N186 End stage renal disease: Secondary | ICD-10-CM | POA: Diagnosis not present

## 2023-06-24 DIAGNOSIS — H2511 Age-related nuclear cataract, right eye: Secondary | ICD-10-CM | POA: Diagnosis not present

## 2023-06-24 DIAGNOSIS — I10 Essential (primary) hypertension: Secondary | ICD-10-CM | POA: Diagnosis not present

## 2023-06-25 DIAGNOSIS — N186 End stage renal disease: Secondary | ICD-10-CM | POA: Diagnosis not present

## 2023-06-25 DIAGNOSIS — Z992 Dependence on renal dialysis: Secondary | ICD-10-CM | POA: Diagnosis not present

## 2023-06-27 DIAGNOSIS — N186 End stage renal disease: Secondary | ICD-10-CM | POA: Diagnosis not present

## 2023-06-27 DIAGNOSIS — Z992 Dependence on renal dialysis: Secondary | ICD-10-CM | POA: Diagnosis not present

## 2023-06-30 DIAGNOSIS — Z992 Dependence on renal dialysis: Secondary | ICD-10-CM | POA: Diagnosis not present

## 2023-06-30 DIAGNOSIS — N186 End stage renal disease: Secondary | ICD-10-CM | POA: Diagnosis not present

## 2023-07-02 DIAGNOSIS — N186 End stage renal disease: Secondary | ICD-10-CM | POA: Diagnosis not present

## 2023-07-02 DIAGNOSIS — Z992 Dependence on renal dialysis: Secondary | ICD-10-CM | POA: Diagnosis not present

## 2023-07-04 DIAGNOSIS — N186 End stage renal disease: Secondary | ICD-10-CM | POA: Diagnosis not present

## 2023-07-04 DIAGNOSIS — Z992 Dependence on renal dialysis: Secondary | ICD-10-CM | POA: Diagnosis not present

## 2023-07-07 DIAGNOSIS — Z992 Dependence on renal dialysis: Secondary | ICD-10-CM | POA: Diagnosis not present

## 2023-07-07 DIAGNOSIS — N186 End stage renal disease: Secondary | ICD-10-CM | POA: Diagnosis not present

## 2023-07-09 DIAGNOSIS — Z992 Dependence on renal dialysis: Secondary | ICD-10-CM | POA: Diagnosis not present

## 2023-07-09 DIAGNOSIS — N186 End stage renal disease: Secondary | ICD-10-CM | POA: Diagnosis not present

## 2023-07-11 DIAGNOSIS — Z992 Dependence on renal dialysis: Secondary | ICD-10-CM | POA: Diagnosis not present

## 2023-07-11 DIAGNOSIS — N186 End stage renal disease: Secondary | ICD-10-CM | POA: Diagnosis not present

## 2023-07-14 DIAGNOSIS — Z992 Dependence on renal dialysis: Secondary | ICD-10-CM | POA: Diagnosis not present

## 2023-07-14 DIAGNOSIS — N186 End stage renal disease: Secondary | ICD-10-CM | POA: Diagnosis not present

## 2023-07-16 DIAGNOSIS — N186 End stage renal disease: Secondary | ICD-10-CM | POA: Diagnosis not present

## 2023-07-16 DIAGNOSIS — Z992 Dependence on renal dialysis: Secondary | ICD-10-CM | POA: Diagnosis not present

## 2023-07-18 DIAGNOSIS — Z992 Dependence on renal dialysis: Secondary | ICD-10-CM | POA: Diagnosis not present

## 2023-07-18 DIAGNOSIS — N186 End stage renal disease: Secondary | ICD-10-CM | POA: Diagnosis not present

## 2023-07-21 DIAGNOSIS — N186 End stage renal disease: Secondary | ICD-10-CM | POA: Diagnosis not present

## 2023-07-21 DIAGNOSIS — Z992 Dependence on renal dialysis: Secondary | ICD-10-CM | POA: Diagnosis not present

## 2023-07-23 DIAGNOSIS — N186 End stage renal disease: Secondary | ICD-10-CM | POA: Diagnosis not present

## 2023-07-23 DIAGNOSIS — Z992 Dependence on renal dialysis: Secondary | ICD-10-CM | POA: Diagnosis not present

## 2023-07-25 DIAGNOSIS — N186 End stage renal disease: Secondary | ICD-10-CM | POA: Diagnosis not present

## 2023-07-25 DIAGNOSIS — Z992 Dependence on renal dialysis: Secondary | ICD-10-CM | POA: Diagnosis not present

## 2023-12-26 DEATH — deceased
